# Patient Record
Sex: Female | Born: 1935 | Race: White | Hispanic: No | Marital: Married | Smoking: Former smoker
Health system: Southern US, Community
[De-identification: ages and names within clinical notes are randomized; demographics above are authoritative.]

## PROBLEM LIST (undated history)

## (undated) DIAGNOSIS — Z8673 Personal history of transient ischemic attack (TIA), and cerebral infarction without residual deficits: Secondary | ICD-10-CM

## (undated) DIAGNOSIS — R911 Solitary pulmonary nodule: Secondary | ICD-10-CM

## (undated) DIAGNOSIS — I2510 Atherosclerotic heart disease of native coronary artery without angina pectoris: Secondary | ICD-10-CM

## (undated) DIAGNOSIS — E785 Hyperlipidemia, unspecified: Secondary | ICD-10-CM

## (undated) DIAGNOSIS — J449 Chronic obstructive pulmonary disease, unspecified: Secondary | ICD-10-CM

## (undated) DIAGNOSIS — Z9981 Dependence on supplemental oxygen: Secondary | ICD-10-CM

## (undated) DIAGNOSIS — K683 Retroperitoneal hematoma: Secondary | ICD-10-CM

## (undated) DIAGNOSIS — D649 Anemia, unspecified: Secondary | ICD-10-CM

## (undated) DIAGNOSIS — I480 Paroxysmal atrial fibrillation: Secondary | ICD-10-CM

## (undated) DIAGNOSIS — I5032 Chronic diastolic (congestive) heart failure: Secondary | ICD-10-CM

## (undated) DIAGNOSIS — K661 Hemoperitoneum: Secondary | ICD-10-CM

## (undated) DIAGNOSIS — J9610 Chronic respiratory failure, unspecified whether with hypoxia or hypercapnia: Secondary | ICD-10-CM

## (undated) DIAGNOSIS — I714 Abdominal aortic aneurysm, without rupture, unspecified: Secondary | ICD-10-CM

## (undated) DIAGNOSIS — I10 Essential (primary) hypertension: Secondary | ICD-10-CM

## (undated) DIAGNOSIS — N183 Chronic kidney disease, stage 3 unspecified: Secondary | ICD-10-CM

## (undated) HISTORY — PX: HEMORRHOID SURGERY: SHX153

## (undated) HISTORY — DX: Abdominal aortic aneurysm, without rupture: I71.4

## (undated) HISTORY — DX: Retroperitoneal hematoma: K68.3

## (undated) HISTORY — PX: APPENDECTOMY: SHX54

## (undated) HISTORY — PX: ABDOMINAL HYSTERECTOMY: SHX81

## (undated) HISTORY — DX: Chronic respiratory failure, unspecified whether with hypoxia or hypercapnia: J96.10

## (undated) HISTORY — DX: Paroxysmal atrial fibrillation: I48.0

## (undated) HISTORY — DX: Chronic diastolic (congestive) heart failure: I50.32

## (undated) HISTORY — DX: Abdominal aortic aneurysm, without rupture, unspecified: I71.40

## (undated) HISTORY — DX: Dependence on supplemental oxygen: Z99.81

## (undated) HISTORY — PX: TONSILLECTOMY: SUR1361

## (undated) HISTORY — DX: Atherosclerotic heart disease of native coronary artery without angina pectoris: I25.10

## (undated) HISTORY — DX: Anemia, unspecified: D64.9

## (undated) HISTORY — PX: PARTIAL HIP ARTHROPLASTY: SHX733

## (undated) HISTORY — PX: CAROTID ENDARTERECTOMY: SUR193

## (undated) HISTORY — DX: Hemoperitoneum: K66.1

---

## 2003-12-30 ENCOUNTER — Ambulatory Visit (HOSPITAL_COMMUNITY): Admission: RE | Admit: 2003-12-30 | Discharge: 2003-12-30 | Payer: Self-pay | Admitting: Family Medicine

## 2004-01-28 ENCOUNTER — Encounter (HOSPITAL_COMMUNITY): Admission: RE | Admit: 2004-01-28 | Discharge: 2004-02-27 | Payer: Self-pay | Admitting: Neurosurgery

## 2004-10-11 ENCOUNTER — Ambulatory Visit (HOSPITAL_COMMUNITY): Admission: RE | Admit: 2004-10-11 | Discharge: 2004-10-11 | Payer: Self-pay | Admitting: Family Medicine

## 2004-12-08 ENCOUNTER — Ambulatory Visit (HOSPITAL_COMMUNITY): Admission: RE | Admit: 2004-12-08 | Discharge: 2004-12-08 | Payer: Self-pay | Admitting: Family Medicine

## 2006-09-01 ENCOUNTER — Ambulatory Visit (HOSPITAL_COMMUNITY): Admission: RE | Admit: 2006-09-01 | Discharge: 2006-09-01 | Payer: Self-pay | Admitting: Family Medicine

## 2007-05-05 ENCOUNTER — Inpatient Hospital Stay (HOSPITAL_COMMUNITY): Admission: EM | Admit: 2007-05-05 | Discharge: 2007-05-09 | Payer: Self-pay | Admitting: Emergency Medicine

## 2007-05-05 ENCOUNTER — Ambulatory Visit: Payer: Self-pay | Admitting: Internal Medicine

## 2007-06-01 ENCOUNTER — Ambulatory Visit: Payer: Self-pay | Admitting: Vascular Surgery

## 2007-06-07 ENCOUNTER — Encounter: Payer: Self-pay | Admitting: Vascular Surgery

## 2007-06-07 ENCOUNTER — Ambulatory Visit: Payer: Self-pay | Admitting: Vascular Surgery

## 2007-06-07 ENCOUNTER — Inpatient Hospital Stay (HOSPITAL_COMMUNITY): Admission: RE | Admit: 2007-06-07 | Discharge: 2007-06-08 | Payer: Self-pay | Admitting: Vascular Surgery

## 2007-06-20 ENCOUNTER — Ambulatory Visit: Payer: Self-pay | Admitting: Vascular Surgery

## 2007-08-10 ENCOUNTER — Ambulatory Visit (HOSPITAL_COMMUNITY): Admission: RE | Admit: 2007-08-10 | Discharge: 2007-08-10 | Payer: Self-pay | Admitting: Family Medicine

## 2008-01-16 ENCOUNTER — Ambulatory Visit: Payer: Self-pay | Admitting: Vascular Surgery

## 2008-02-18 ENCOUNTER — Ambulatory Visit (HOSPITAL_COMMUNITY): Admission: RE | Admit: 2008-02-18 | Discharge: 2008-02-18 | Payer: Self-pay | Admitting: Family Medicine

## 2009-10-21 ENCOUNTER — Ambulatory Visit (HOSPITAL_COMMUNITY): Admission: RE | Admit: 2009-10-21 | Discharge: 2009-10-21 | Payer: Self-pay | Admitting: Family Medicine

## 2010-02-04 ENCOUNTER — Encounter: Admission: RE | Admit: 2010-02-04 | Discharge: 2010-02-04 | Payer: Self-pay | Admitting: Orthopedic Surgery

## 2010-03-12 ENCOUNTER — Ambulatory Visit (HOSPITAL_COMMUNITY): Admission: RE | Admit: 2010-03-12 | Discharge: 2010-03-12 | Payer: Self-pay | Admitting: Family Medicine

## 2010-04-26 ENCOUNTER — Inpatient Hospital Stay (HOSPITAL_COMMUNITY): Admission: RE | Admit: 2010-04-26 | Discharge: 2010-04-28 | Payer: Self-pay | Admitting: Orthopedic Surgery

## 2010-04-26 ENCOUNTER — Encounter (INDEPENDENT_AMBULATORY_CARE_PROVIDER_SITE_OTHER): Payer: Self-pay | Admitting: Orthopedic Surgery

## 2010-06-22 ENCOUNTER — Ambulatory Visit (HOSPITAL_COMMUNITY): Admission: RE | Admit: 2010-06-22 | Discharge: 2010-06-22 | Payer: Self-pay | Admitting: Family Medicine

## 2010-06-30 ENCOUNTER — Encounter (HOSPITAL_COMMUNITY): Admission: RE | Admit: 2010-06-30 | Discharge: 2010-07-30 | Payer: Self-pay | Admitting: Orthopedic Surgery

## 2010-09-16 ENCOUNTER — Ambulatory Visit (HOSPITAL_COMMUNITY): Admission: RE | Admit: 2010-09-16 | Discharge: 2010-09-16 | Payer: Self-pay | Admitting: Family Medicine

## 2010-11-27 ENCOUNTER — Encounter: Payer: Self-pay | Admitting: Family Medicine

## 2011-01-23 LAB — COMPREHENSIVE METABOLIC PANEL
ALT: 21 U/L (ref 0–35)
AST: 21 U/L (ref 0–37)
Albumin: 3.8 g/dL (ref 3.5–5.2)
Alkaline Phosphatase: 73 U/L (ref 39–117)
BUN: 16 mg/dL (ref 6–23)
CO2: 28 mEq/L (ref 19–32)
Calcium: 9.5 mg/dL (ref 8.4–10.5)
Chloride: 106 mEq/L (ref 96–112)
Creatinine, Ser: 1.03 mg/dL (ref 0.4–1.2)
GFR calc Af Amer: >60 mL/min (ref >60)
GFR calc non Af Amer: 53 mL/min — ABNORMAL LOW (ref >60)
Glucose, Bld: 156 mg/dL — ABNORMAL HIGH (ref 70–99)
Potassium: 3.2 mEq/L — ABNORMAL LOW (ref 3.5–5.1)
Sodium: 143 mEq/L (ref 135–145)
Total Bilirubin: 0.4 mg/dL (ref 0.3–1.2)
Total Protein: 6.6 g/dL (ref 6.0–8.3)

## 2011-01-23 LAB — CBC
HCT: 28.4 % — ABNORMAL LOW (ref 36.0–46.0)
HCT: 41.4 % (ref 36.0–46.0)
Hemoglobin: 14.1 g/dL (ref 12.0–15.0)
Hemoglobin: 9.4 g/dL — ABNORMAL LOW (ref 12.0–15.0)
MCHC: 34 g/dL (ref 30.0–36.0)
MCV: 91 fL (ref 78.0–100.0)
Platelets: 161 10*3/uL (ref 150–400)
Platelets: 183 10*3/uL (ref 150–400)
Platelets: 248 10*3/uL (ref 150–400)
RBC: 4.55 MIL/uL (ref 3.87–5.11)
RDW: 14.6 % (ref 11.5–15.5)
RDW: 14.9 % (ref 11.5–15.5)
WBC: 8.9 10*3/uL (ref 4.0–10.5)
WBC: 9.4 10*3/uL (ref 4.0–10.5)

## 2011-01-23 LAB — URINE MICROSCOPIC-ADD ON

## 2011-01-23 LAB — BASIC METABOLIC PANEL
BUN: 14 mg/dL (ref 6–23)
Calcium: 8.1 mg/dL — ABNORMAL LOW (ref 8.4–10.5)
Creatinine, Ser: 1 mg/dL (ref 0.4–1.2)
GFR calc non Af Amer: 49 mL/min — ABNORMAL LOW (ref >60)
GFR calc non Af Amer: 54 mL/min — ABNORMAL LOW (ref >60)
Glucose, Bld: 134 mg/dL — ABNORMAL HIGH (ref 70–99)
Potassium: 3.6 mEq/L (ref 3.5–5.1)
Sodium: 142 mEq/L (ref 135–145)

## 2011-01-23 LAB — TYPE AND SCREEN
ABO/RH(D): B POS
Antibody Screen: NEGATIVE

## 2011-01-23 LAB — URINE CULTURE
Colony Count: NO GROWTH
Culture: NO GROWTH

## 2011-01-23 LAB — DIFFERENTIAL
Basophils Absolute: 0 10*3/uL (ref 0.0–0.1)
Basophils Relative: 0 % (ref 0–1)
Eosinophils Absolute: 0.2 10*3/uL (ref 0.0–0.7)
Eosinophils Relative: 2 % (ref 0–5)
Lymphocytes Relative: 16 % (ref 12–46)
Lymphs Abs: 1.4 10*3/uL (ref 0.7–4.0)
Monocytes Absolute: 0.7 10*3/uL (ref 0.1–1.0)
Monocytes Relative: 8 % (ref 3–12)
Neutro Abs: 6.6 10*3/uL (ref 1.7–7.7)
Neutrophils Relative %: 74 % (ref 43–77)

## 2011-01-23 LAB — URINALYSIS, ROUTINE W REFLEX MICROSCOPIC
Ketones, ur: NEGATIVE mg/dL
Leukocytes, UA: NEGATIVE
Nitrite: NEGATIVE
Protein, ur: >300 mg/dL — AB
pH: 5.5 (ref 5.0–8.0)

## 2011-01-23 LAB — PROTIME-INR
INR: 0.97 (ref 0.00–1.49)
Prothrombin Time: 12.8 seconds (ref 11.6–15.2)

## 2011-01-23 LAB — APTT: aPTT: 23 seconds — ABNORMAL LOW (ref 24–37)

## 2011-03-22 NOTE — Op Note (Signed)
Sydney Jacobs, Sydney Jacobs               ACCOUNT NO.:  [id number]   MEDICAL RECORD NO.:  [id number]          PATIENT TYPE:  INP   LOCATION:  3302                         FACILITY:  [facility]   PHYSICIAN:  Janetta Hora. Fields, MD  DATE OF BIRTH:  [birth year]   DATE OF PROCEDURE:  06/07/2007  DATE OF DISCHARGE:                               OPERATIVE REPORT   PROCEDURE:  Left carotid endarterectomy.   PREOPERATIVE DIAGNOSIS:  Symptomatic left internal carotid artery  stenosis, greater than 70%.   POSTOPERATIVE DIAGNOSIS:  Symptomatic left internal carotid artery  stenosis, greater than 70%.   ANESTHESIA:  General.   SURGEON:  Charles E. Fields, M.D.   ASSISTANT:  Allen Kell, RNFA   OPERATIVE INDICATIONS:  The patient is a 77 year old female who,  approximately one month ago, had a left brain stroke.  She was noted to  have a 70% left internal carotid artery stenosis.   OPERATIVE FINDINGS:  1. 70% left internal carotid artery stenosis.  2. Exophytic plaque.  3. Dacron patch.  4. 10 French shunt.   OPERATIVE DETAILS:  After obtaining informed consent, the patient was  taken to the operating room.  The patient was placed in the supine  position on the operating table.  After induction of general anesthesia  and endotracheal intubation, the patient's entire left neck and chest  were prepped and draped in the usual sterile fashion.  An oblique  incision was made on the left neck just anterior to the left  sternocleidomastoid muscle.  The incision was carried down through the  subcutaneous tissues and platysma.  The sternocleidomastoid muscle was  reflected laterally.  Dissection was then carried down along a plane  anterior to the left internal jugular vein.  The common carotid artery  was dissected free circumferentially.  The vagus nerve was identified  and protected from harm's way.  External carotid and superior thyroid  arteries were dissected free circumferentially and vessel  loops placed  around these.  Dissection was then carried to the level of the carotid  bifurcation.  The hypoglossal nerve was identified and protected from  harm's way.  The internal carotid artery was dissected free  circumferentially just above the area where the plaque was palpated.  The patient was then given 5000 units of intravenous heparin.   The distal internal carotid artery was controlled with a vessel loop.  The external and superior thyroid arteries were controlled with vessel  loops followed by clamping of the common carotid artery with a DeBakey  clamp.  A longitudinal arteriotomy was then made in the common carotid  artery and this was extended up through the carotid bifurcation past the  level of disease.  The stenosis was approximately 70%.  There was a  large exophytic plaque near the primary area of stenosis in the proximal  internal carotid artery.  The arteriotomy was extended proximally down  past the level of some mild disease in the common carotid artery.   Next, a 10-French shunt was brought up on the operative field.  This was  then threaded into the distal internal carotid  artery and allowed to  back bleed thoroughly.  It was then placed down into the common carotid  artery after unclamping, inspected for air, and then opened with  restoration of flow to the brain after approximately five minutes.  The  proximal common carotid artery was then controlled with a Rumel  tourniquet.  Next, endarterectomy was begun in a suitable plane near the  carotid bifurcation.  All plaque was completely removed.  Good distal  and proximal end points were obtained.  All loose debris was then  removed from the carotid and the external carotid artery was  endarterectomized by eversion technique.  Next, the carotid was  thoroughly irrigated with heparinized saline.  A Dacron patch was  brought up on the operative field.  This was sewn on as a patch  angioplasty using running 6-0  Prolene suture.  Just prior to completion  of the anastomosis, the shunt was reclamped, the distal internal carotid  controlled with a fine bulldog clamp, and the common carotid artery  controlled with a peripheral DeBakey after the shunt was removed.  Everything was thoroughly flushed and back bled.  The artery was then  thoroughly flushed again with heparinized saline.  The anastomosis was  then completed.  Flow was then first restored from the common carotid up  into the external carotid artery and after approximately five cardiac  cycles, to the internal carotid artery.  One repair suture was placed on  the lateral aspect.  The Doppler was used to inspect the carotid and  there was good flow through the internal and external carotid and common  carotid arteries.   Hemostasis was obtained.  The platysma muscle was then reapproximated  using a running 3-0 Vicryl suture.  The skin was closed with 4-0 Vicryl  subcuticular stitch.  The patient tolerated the procedure well and there  were no complications.  Instrument, sponge and needle counts were  correct at the end of the case.  The patient was awakened in the  operating room, found to be moving upper extremities and lower  extremities symmetrically with 5/5 motor strength.  The patient was  taken to recovery room in stable condition.      Janetta Hora. Fields, MD  Electronically Signed     CEF/MEDQ  D:  06/07/2007  T:  06/07/2007  Job:  [id number]

## 2011-03-22 NOTE — Procedures (Signed)
CAROTID DUPLEX EXAM   INDICATION:  Followup known carotid artery disease.   HISTORY:  Diabetes:  No.  Cardiac:  Coronary artery disease.  Hypertension:  Yes.  Smoking:  No.  Previous Surgery:  No.  CV History:  No.  Amaurosis Fugax no, paresthesias no, hemiparesis no.                                       RIGHT             LEFT  Brachial systolic pressure:         138.              130.  Brachial Doppler waveforms:         Biphasic.         Biphasic.  Vertebral direction of flow:        Antegrade.        Antegrade.  DUPLEX VELOCITIES (cm/sec)  CCA peak systolic                   57.               89.  ECA peak systolic                   101.              215.  ICA peak systolic                   186.              235.  ICA end diastolic                   140.              64.  PLAQUE MORPHOLOGY:                  Calcified.        Calcified.  PLAQUE AMOUNT:                      Moderate.         Moderate to  severe.  PLAQUE LOCATION:                    ICA.              ICA.   IMPRESSION:  There is 60% to 79% stenosis noted in the left internal  carotid artery. There is 40% - 59% stenosis noted in the right internal  carotid artery. Antegrade bilateral vertebral arteries.   ___________________________________________  Janetta Hora Fields, MD   MG/MEDQ  D:  06/01/2007  T:  06/02/2007  Job:  [id number]

## 2011-03-22 NOTE — H&P (Signed)
Sydney Jacobs, Sydney Jacobs               ACCOUNT NO.:  [id number]   MEDICAL RECORD NO.:  [id number]          PATIENT TYPE:  EMS   LOCATION:  ED                            FACILITY:  [facility]   PHYSICIAN:  Skeet Latch, DO    DATE OF BIRTH:  [birth year]   DATE OF ADMISSION:  05/05/2007  DATE OF DISCHARGE:  LH                              HISTORY & PHYSICAL   CHIEF COMPLAINT:  Difficulty speaking and swollen tongue.   PRIMARY CARE PHYSICIAN:  Dr. Phillips Odor.   HISTORY OF PRESENT ILLNESS:  This is a 77 year old Caucasian female who  presents with the complaint of the sensation that her tongue is swollen.  Patient states that this afternoon after just sitting at her home, she  began to experience the sensation that her tongue was swollen.  The  patient then talked to her daughter by phone and her daughter states  that she thought that she was slurring her speech.  Since her daughter  lives next door she came over to the house.  They discussed her symptoms  and patient basically states that she had the sensation that her tongue  was swollen and she had problems swallowing.  The patient was then  brought to the emergency room for evaluation.  The patient states that  she does not have any facial droop or weakness on either side of  her  body but just states that she has the sensation that her tongue was  swollen.  Patient denies any difficulty swallowing, walking, dizziness,  nausea, neck pain, or any other type of pain.  The patient does admit to  a slight headache that occurred since she has been in the ER. Upon being  seen in the ER, patient had a CAT scan of her head performed which  showed chronic microvascular white matter disease but no acute findings.  The patient's only medical history is hypertension for which she takes  Diovan and Norvasc.  The patient does admit that she took one Advil  during this episode at home, otherwise took no other medications.   PAST MEDICAL HISTORY:   Hypertension.   FAMILY HISTORY:  Unremarkable.   SOCIAL HISTORY:  Denies any smoking, alcohol or illicit drug use.   ALLERGIES:  QUESTIONABLE PENICILLIN ALLERGY.   MEDICATIONS:  1. Diovan HCT 80/12.5 mg daily.  2. Norvasc 10 mg daily.  The patient does state that she took a new allergy medicine prescribed  by her primary care physician, Xyzal this a.m.   REVIEW OF SYSTEMS:  HEENT:  Patient admits to a slight headache.  Denies  any blurry vision.  Does admit to some sensation of a swollen tongue.  Denies any throat pain, nose problems.  CARDIOVASCULAR:  Denies any  chest pain.  RESPIRATORY:  Denies any shortness of breath or dyspnea.  GI: Denies any nausea, vomiting, diarrhea, abdominal pain.  GENITOURINARY:  Denies any dysuria, urgency.  MUSCULOSKELETAL:  Denies  any arthralgias.  SKIN:  Denies any rashes, skin lesions.  NEUROLOGIC:  As stated, a slight headache but no light headedness or  dizziness.  All other systems  are negative.   PHYSICAL EXAMINATION:  VITAL SIGNS:  Temperature is 98.3, respirations  18, pulse 82, blood pressure is 139/69.  HEENT:  Head is atraumatic, normocephalic. PERLA. EOMI. Conjunctivae and  lids are normal, no scleral icterus noted. Nose and throat normal.  Tongue  - did not appreciate any swelling.  Good dentition.  Patient had  a normal voice.  NECK:  Supple, nontender, nondistended, no thyromegaly, no bruits, no  lymphadenopathy noted.  CARDIOVASCULAR:  Regular rate and rhythm, no murmurs or gallops were  noted.  RESPIRATORY:  Lungs were clear to auscultation, no rhonchi or wheezing,  no rubs.  ABDOMEN:  Soft, nontender, nondistended, bowel sounds were present.  EXTREMITIES:  No clubbing, cyanosis or edema.  NEUROLOGIC:  The patient is alert and oriented x3. Cranial nerves II-XII  grossly intact. Had normal sensation. Gait was normal. Normal speech.  The patient had good strength bilaterally in upper and lower  extremities.  SKIN:  Normal  turgor, good color, warm and dry.   LABORATORY DATA:  CT of her head as stated, showed chronic microvascular  changes. No acute disease.  Sodium 138, potassium 3.4, chloride 101, CO2  is 28, glucose 96, BUN 26, creatinine 1.41.  INR is 1, PT 13.6.  White  count is 8,000, hemoglobin 12.5, hematocrit 36.2, platelets 258,000.   ASSESSMENT:  This is a 77 year old who presents with a complaint of  sensation that her tongue is swollen.  The daughter states the patient  appeared to have slurred speech but denied any facial droop or muscle  weakness.  CT of her head did not show any acute findings.   PLAN:  1. The patient will be admitted with the diagnosis of dysarthria,      hypertension.  For her dysarthria, a possible RTAA. Patient had a      negative CT of her head, will get an MRI in the a.m. to rule out      any acute process.  Give patient aspirin 325 mg and place patient      with SEDs and TED hose for DVT prophylaxis.  2. Hypertension:  Continue home medications which include Diovan and      Norvasc. The patient probably will need and echo and carotid      Doppler's and will see how patient is doing in the a.m.  If patient      remains stable, may get these tests as an outpatient.  It is      possible patient may have had an allergic reaction to food or      medication. Will place patient on Benadryl and follow patient      carefully.      Skeet Latch, DO  Electronically Signed     SM/MEDQ  D:  05/05/2007  T:  05/05/2007  Job:  [id number]   cc:   Corrie Mckusick, M.D.  Fax: [phone]

## 2011-03-22 NOTE — Consult Note (Signed)
Sydney Jacobs, Sydney Jacobs               ACCOUNT NO.:  [id number]   MEDICAL RECORD NO.:  [id number]          PATIENT TYPE:  INP   LOCATION:  A202                          FACILITY:  [facility]   PHYSICIAN:  Kofi A. Gerilyn Pilgrim, M.D. DATE OF BIRTH:  [birth year]   DATE OF CONSULTATION:  05/08/2007  DATE OF DISCHARGE:                                 CONSULTATION   REASON FOR CONSULTATION:  This is a 77 year old lady who presents with  the acute onset of sensation, as if her tongue is swollen. The patient's  daughter also indicated that the patient had slurring of the speech. The  events apparently lasted for a couple of hours until she came to the  emergency room. She has denied any focal numbness, weakness, dizziness,  or swallowing problems. It appears that she has returned back to  baseline. No facial weakness is reported.   PAST MEDICAL HISTORY:  Hypertension.   FAMILY HISTORY:  Unrevealing.   SOCIAL HISTORY:  No alcohol, tobacco, or illicit drug use.   ALLERGIES:  PENICILLIN.   ADMISSION MEDICATIONS:  Diovan 80/12.5 daily, Norvasc 10 mg daily. She  was not taking anti-platelet agents.   REVIEW OF SYSTEMS:  Essentially unrevealing. No headaches are reported.  No cardiovascular symptoms such as chest pain or shortness of breath.   PHYSICAL EXAMINATION:  GENERAL:  A pleasant lady in no acute distress.  VITAL SIGNS:  Temperature 97.9, pulse 87, respiratory rate 17, blood  pressure 147/79.  HEENT:  Normocephalic and atraumatic.  NECK:  Supple.  EXTREMITIES:  No clubbing, cyanosis, or edema.  NEUROLOGIC:  The patient is awake and alert. She converses well. Tonight  I see no evidence of dysphagia, dysarthria, or aphasia. Speech,  language, and cognition are essentially normal. Cranial nerve  evaluation, pupils are 4 mm and reactive. Visual fields are intact.  Extraocular movements are full. Facial muscles are symmetric. Tongue is  midline. Uvula midline. Shoulder shrug is normal. Motor  examination  shows normal tone, bulk, and strength. There is no pronator drift.  Coordination shows no tremors, dysmetria, past pointing or rigidity.  Reflexes are preserved. Sensation normal to temperature and light touch.   DIAGNOSTIC STUDIES:  Initial CT scan of the brain shows nothing acute,  essentially unrevealing. MRI of the brain shows acute infarcts, 2 or 3  small to moderate lesions on diffusion imaging involving the left  parietal area, involving the MCA distribution. There is a suggestion of  possibly a tiny hyper-intensity noted, seen on one cut involving the  cortical ribbon of the occipital area on the left side but this is  questionable. There are significant incomplete white matter-looking  encephalopathy, indicating chronic ischemic changes.   ASSESSMENT:  1. Acute left parietal infarct.  2. Possible tiny acute left occipital area of infarct.  3. Hypertension.   RECOMMENDATIONS:  1. I agree with aspirin, anti-platelet therapy.  2. Echo of the carotids.  3. I will go ahead and add MRA.   Thanks for this consultation.      Kofi A. Gerilyn Pilgrim, M.D.  Electronically Signed     KAD/MEDQ  D:  05/08/2007  T:  05/08/2007  Job:  [id number]

## 2011-03-22 NOTE — Procedures (Signed)
[name], Sydney Jacobs               ACCOUNT NO.:  [id number]   MEDICAL RECORD NO.:  [id number]          PATIENT TYPE:  INP   LOCATION:  A202                          FACILITY:  [facility]   PHYSICIAN:  Pricilla Riffle, MD, FACCDATE OF BIRTH:  09/15/1934   DATE OF PROCEDURE:  05/07/2007  DATE OF DISCHARGE:                                ECHOCARDIOGRAM   REFERRING PHYSICIAN:  Dr. Rito Ehrlich.   INDICATIONS:  A 77 year old woman with history of CVA and hypertension.   2-D ECHOCARDIOGRAM WITH ECHOCARDIOGRAPHIC DOPPLER:  Left ventricle is a  small in size with an end-diastolic dimension of approximately 34 mm.  The interventricular septum and posterior wall were normal at 11 mm  each.  The left atrium is normal at 28 mm.  Right atrium, right  ventricle are normal.  Aortic root is normal at 26 mm.   The aortic valve is mildly thickened, not stenotic.  There is no  insufficiency.  Mitral valve is mildly thickened with mild calcification  of the chordae. There is no stenosis. There is mild annular  calcification.  There is mild insufficiency (1 out of 4). The pulmonic  valve is not well seen.  Tricuspid valve is normal with no  insufficiency.   Overall LV systolic function is normal with an LVEF of approximately 55-  60%.  There is mild diastolic dysfunction.   RV systolic function is normal.   No pericardial effusion is seen.   NOTE:  There is an echo-free space in the liver consistent with possible  cyst. Recommend consider evaluation with other modality (right upper  quadrant ultrasound).      Pricilla Riffle, MD, [facility]  Electronically Signed     PVR/MEDQ  D:  05/07/2007  T:  05/07/2007  Job:  [id number]   cc:   Dr. Rito Ehrlich

## 2011-03-22 NOTE — Consult Note (Signed)
NAME:  Sydney Jacobs, Sydney Jacobs NO.:  [id number]   MEDICAL RECORD NO.:  [id number]          PATIENT TYPE:  INP   LOCATION:  A202                          FACILITY:  [facility]   PHYSICIAN:  Pricilla Riffle, MD, FACCDATE OF BIRTH:  1934/05/03   DATE OF CONSULTATION:  05/07/2007  DATE OF DISCHARGE:                                 CONSULTATION   IDENTIFICATION:  The patient is a 77 year old who we are asked to see  regarding an abnormal EKG.   HISTORY OF PRESENT ILLNESS:  The patient was admitted on June 28 with  sensation of a swollen tongue, swallowing problems, went to the  emergency room that day.  ER scheduled a head CT that showed no acute  findings.  The patient currently felt to have suffered a stroke/TIA and  her symptoms have resolved.  The patient notes occasional shortness of  breath.  No chest pain.  No syncope.  Had stress test many years ago.  No known cardiac problems.   ALLERGIES:  QUESTION PENICILLIN.   MEDICATIONS:  1. Diovan/HCTZ 80/12.5.  2. Norvasc 10.  3. Advil p.r.n.  4. Allergy medicine, question type.   HOSPITAL MEDICATIONS:  1. Aspirin 325.  2. Prevacid 30.  3. Norvasc.  4. Diovan HCTZ.  5. Clonidine p.r.n.  6. Lovenox 40 daily.  7. Zocor 20 daily.   PAST MEDICAL HISTORY:  Hypertension.   SOCIAL HISTORY:  Does not smoke, does not drink.  Married.   FAMILY HISTORY:  Father died of an MI.  Mother died of an aneurysm in  her head.   REVIEW OF SYSTEMS:  All systems reviewed.  Negative to the above problem  except as noted above.   PHYSICAL EXAMINATION:  GENERAL:  The patient is in no acute distress.  Denies chest pain or shortness of breath.  VITAL SIGNS:  Blood pressure is 147/79 range today from 118 systolic to  161, pulse is in the 80s to 100, temperature is 97.7.  HEENT:  Normocephalic, atraumatic.  EOMI.  PERRL.  Throat clear.  Nares  clear.  NECK:  JVP is normal, left carotid bruit.  No thyromegaly.  LUNGS:  Clear to auscultation.  CARDIAC:  Exam regular rate rhythm S1-S2.  No S3-S4 or significant  murmurs.  ABDOMEN:  Supple, nontender.  No hepatomegaly.  No masses.  Normal bowel  sounds.  EXTREMITIES:  Good distal pulses throughout.  No lower extremity edema.   STUDIES:  A 12-lead EKG shows normal sinus rhythm, 84 beats per minute.  Left bundle branch block.   Echocardiogram pending.   LABORATORY DATA:  CK 87, MB of 3.5, troponin 0.03, total cholesterol  225, triglycerides 176, HDL 45, LDL 145.  TSH 1.48, K of 3.5, B-met  earlier 3.4 K.  BUN and creatinine of 18 and 1.1.  CBC on June 28,  hemoglobin and 12.5, WBC 8.0, platelets of 258.   IMPRESSION:  The patient is a 77 year old admitted with speech changes,  felt probably to have had a CVA, which is undergoing workup.  MRI is  pending.  EKG is abnormal with left bundle  branch block.  She has had  EKGs in the past, had a stress test in the past here at [facility].  I  will try to retrieve this.  She also had an echocardiogram today.   She denied chest pain, no syncope.  Exam is relatively unremarkable.  EKG again as noted.   IMPRESSION:  1. Left bundle branch block.  Will review old EKGs and stress test.      If this is a new finding, at some point once the above problem has      resolved and she has recovered, would schedule an outpatient stress      test to further define, but again only when neurologic issues have      stabilized, and the patient would be a candidate for possible      intervention if needed.  2. Dyslipidemia.  Agree with statin.  3. Carotid bruit, again patient having MRI done.  Hopefully, this will      evaluate the carotid arteries as well.  4. We will follow up with the patient and previous results.      Pricilla Riffle, MD, [facility]  Electronically Signed     PVR/MEDQ  D:  05/07/2007  T:  05/08/2007  Job:  [id number]

## 2011-03-22 NOTE — Assessment & Plan Note (Signed)
OFFICE VISIT   Sydney, Jacobs  DOB:  1934/03/08                                       06/20/2007  ZOXWR#:[id number]   Sydney Jacobs returns for follow up today after having a left carotid  endarterectomy on June 07, 2007.  Her postoperative recovery was  uneventful.  She presents today with no new neurologic symptoms.  Her  blood pressure was slightly elevated today at 169/92 in the left arm and  168/88 in the right arm.  I advised her she should check her blood  pressure daily and keep a log of this to give to her primary care doctor  at her neck visit.  Her left neck incision is well healed.  She has a  very slight faint left carotid bruit.  Right neck has no bruit. Upper  extremity and lower extremity motor strength is 5/5.  Tongue is midline.  She has no swallowing difficulties.  Overall, Sydney Jacobs is doing well.  She will return in 6 months time for a repeat carotid duplex to make  sure that she has had no renarrowing or progression of disease on the  contralateral side.  She will continue to take one aspirin daily.  She  will follow up sooner if she has new neurologic symptoms.   Janetta Hora. Fields, MD  Electronically Signed   CEF/MEDQ  D:  06/20/2007  T:  06/22/2007  Job:  [id number]   cc:   Corrie Mckusick, M.D.

## 2011-03-22 NOTE — Assessment & Plan Note (Signed)
OFFICE VISIT   Sydney Jacobs, Sydney Jacobs  DOB:  Mar 28, 1934                                       06/01/2007  GMWNU#:[id number]   Note for the chart:  The patient is a 77 year old female who recently  had a stroke in late June early July 2008.  The stroke manifested itself  as some dysarthria and trouble swallowing.  This resolved fairly quickly  and she has returned to her baseline.  During the course of her workup  during her stroke, she was noted to have a left parietal and occipital  infarct by MRI at [facility].  She also had a carotid duplex  scan which showed bilateral 70% internal carotid artery stenosis.  Prior  to the stroke event, she had had no other TIAs or strokes.   ATHEROSCLEROTIC RISK FACTORS:  Include primarily hypertension.  She  denies a history of coronary artery disease or diabetes.  She is a  former smoker, but quit in 1997.   PAST SURGICAL HISTORY:  She had a tonsillectomy, appendectomy,  hemorrhoidectomy, hysterectomy.   PAST MEDICAL HISTORY:  History of claudication.  Recent ABIs at [facility] were 0.73 on the right, and 0.68 on the left.  She  currently has 2-block claudication right greater than left.  She denies  rest pain and has had no non-healing wounds.   MEDICATIONS:  1. Norvasc 10 mg once a day.  2. Diovan HCT 80/12.5 once a day.  3. Zocor 20 mg once a day.  4. Aspirin 81 mg once a day.  5. Fish oil omega-3 1,000 units two tablets daily.   ALLERGIES:  1. PENICILLIN.  She has a side effect from penicillin which causes      dizziness.  2. SULFA, she has nausea.   FAMILY HISTORY:  Noncontributory.   SOCIAL HISTORY:  She is married.  Smoking history is as above.  She does  not consume alcohol regularly.   REVIEW OF SYSTEMS:  She has some dyspnea with exertion.  She denies a  history of asthma, wheezing, GI bleeding, renal dysfunction, seizures,  syncope, dizziness, or changes in eyesight.  She does have  some  arthritis and history of gout.   PHYSICAL EXAMINATION:  Vital signs:  Blood pressure is 147/79 in the  left arm, 156/81 in the right arm, heart rate is 83 and regular.  HEENT:  Unremarkable. She has 2+ carotid pulses with a faint left carotid bruit.  She has 2+ brachial and radial and femoral pulses.  She has absent  popliteal and pedal pulses bilaterally.  Feet are pink, warm, and well  perfused.  Abdomen:  Soft, nontender.  Cardiac:  Regular rate and rhythm  without murmur.  Chest:  Clear to auscultation.   She had a repeat carotid duplex exam today to make sure that she had no  had occlusion of her internal carotid artery since her last duplex exam.  This showed a 60-80% stenosis of the left internal carotid artery with a  peak systolic velocity of 235-cm/sec and a fairly calcified plaque.  She  had a 40-60% right internal carotid artery stenosis.  Vertebral artery  flow was antegrade bilaterally.   The patient has a symptomatic left internal carotid artery stenosis  which is greater than 70%.  I believe she would benefit from left  carotid  endarterectomy for further stroke prophylaxis.  I described to  her today the risk, benefit, possible complications, and procedure  details including, but not limited to, bleeding, infection, stroke,  cranial nerve injury.  She understands and agrees to proceed.  We will  schedule her carotid endarterectomy for next week.  I informed her to  continue her aspirin and her antihypertensives.   Janetta Hora. Fields, MD  Electronically Signed   CEF/MEDQ  D:  06/02/2007  T:  06/04/2007  Job:  [id number]   cc:   Corrie Mckusick, M.D.

## 2011-03-22 NOTE — Group Therapy Note (Signed)
NAMEMAKESHIA, SEAT               ACCOUNT NO.:  [id number]   MEDICAL RECORD NO.:  [id number]          PATIENT TYPE:  INP   LOCATION:  A202                          FACILITY:  [facility]   PHYSICIAN:  Dorris Singh, DO    DATE OF BIRTH:  Sep 30, 1934   DATE OF PROCEDURE:  05/08/2007  DATE OF DISCHARGE:                                 PROGRESS NOTE   The patient is a 77 year old Caucasian female who presented to [facility] with complaints of possible stroke and TIA. The patient has been  here since June 28 and has had a series of testing which includes  carotid duplex on the 30th, which demonstrated severe bilateral carotid  bifurcation, atherosclerosis with elevated abnormal ICA velocities and  ICA: CCA ratios, bilateral ICA stenosis in the 70% range, slightly worse  on the left. Also, had noted for further evaluation conventional  catheter angiogram versus CT angiogram to assess the ICA stenosis. She  had a ultrasound arterial segment multiple Doppler done and that showed  bilateral moderately severe lower extremity arterial occlusive disease  for that and then her MRA of the head, which was done July 1, showed  potential high-grade stenosis in the middle right M2 branch. No  significant left-sided disease with mild atherosclerotic irregularity  within the anterior genu of the right cavernous artery.   Her vitals for today are temperature 99.0, pulse 87, respirations 20,  blood pressure 146/79.  Generally, this is a well-developed, well-nourished Caucasian female who  is in no acute distress.  HEENT: The patient wears glasses. No nasal discharge. Conjunctivae is  within normal limits. Mouth there is no exudate or erythema noted.  NECK: Full range of motion.  HEART: Regular rate and rhythm.  LUNGS:  Respirations are symmetrical. Clear to auscultation bilaterally.  ABDOMEN: Soft and nontender. Nondistended. Bowel sounds x4.  EXTREMITIES: Positive pulses in all four extremities. Good  strength in  all four extremities.  NEUROLOGIC: The patient is alert and oriented x3.   ASSESSMENT/PLAN:  1. Transient ischemic attack/cerebral vascular accident: The MRA shows      significant stenosis in the majority of critical arteries. Will      keep the patient on Coumadin. Requests Dr.  Gerilyn Pilgrim      recommendations regarding Coumadin therapy.  2. Left bundle branch block. This is monitored by cardiology.  3. Hypertension, is stable.  4. Dyslipidemia, will continue the patient on medication.  5. Claudication of lower extremities. Have the arterial Doppler      results and has stated that the patient does have disease.   PLAN:  Plan on discharging the patient in one day and have the patient  followup with Vascular Surgery and her primary care physician who is Dr.  Phillips Odor and Neurology.      Dorris Singh, DO  Electronically Signed     CB/MEDQ  D:  05/08/2007  T:  05/08/2007  Job:  [id number]

## 2011-03-22 NOTE — Discharge Summary (Signed)
Sydney Jacobs, Sydney Jacobs               ACCOUNT NO.:  [id number]   MEDICAL RECORD NO.:  [id number]          PATIENT TYPE:  INP   LOCATION:  A202                          FACILITY:  [facility]   PHYSICIAN:  Skeet Latch, DO    DATE OF BIRTH:  June 25, 1934   DATE OF ADMISSION:  05/05/2007  DATE OF DISCHARGE:  07/02/2008LH                               DISCHARGE SUMMARY   DISCHARGE DIAGNOSES:  1. Acute left parietal infarct.  2. Hypertension.  3. Coronary artery disease.  4. Moderate-to-severe lower extremity arterial disease.   BRIEF HOSPITAL COURSE:  This is a 77 year old Caucasian female who  presented with a complaint that she had the sensation that her tongue  was swollen.  Patient states that this afternoon while sitting at home,  she began to experience that sensation and called her daughter.  Her  daughter thought that she had some slurred speech.  Patient was brought  to the emergency room for evaluation.  There was no obvious facial droop  or weakness on any side of her face.  During the initial exam, the  patient had no difficulty swallowing, walking, dizziness, nausea, or any  type of pain at that time.  The patient did have a CT of her head done  without contrast that showed chronic microvascular changes.   Patient did have an MRI of her brain done that showed acute infarct in  the left frontoparietal cortex.  A small area of acute infarct in the  left occipital lobe.  It showed atrophy of the chronic small vessel  ischemic change in the white matter.   Patient also had carotid Dopplers performed which showed bilateral  mildly severe carotid bifurcation with atherosclerosis with elevated  abnormal ICA velocities and ICA/CCA ratios.  Bilateral ICA stenosis  approaching the 70% range, slightly worse on the left.   The patient arterial studies performed on her legs secondary to pain  that showed bilaterally mildly severe lower extremity arterial occlusive  disease at rest.   The  patient had MRA of her head done which showed potential high grade  stenosis in the middle right M2 branch.  No significant left-sided  disease.  Mild atherosclerotic irregularities within the anterior genu  of the right cavernous carotid artery.   Patient has been feeling well since admission.  Patient has been seen by  neurology, who states that this time he felt the patient could be sent  home on aspirin.  The patient will need vascular surgeon referral as an  outpatient regarding her carotid stenosis and her arterial disease in  her lower extremities.   Patient has also been followed by cardiology, which had a question of  why the patient should be started on Coumadin.  At this time, we feel  the patient is stable enough for discharge and followup as an  outpatient.   DISCHARGE PHYSICAL EXAMINATION:  VITALS ON DISCHARGE:  Temperature is  98.5, pulse 88, respirations 18, blood pressure 130/86.  Patient is  satting at 93%.  HEART:  Regular rate and rhythm.  LUNGS:  Clear to auscultation bilaterally.  ABDOMEN:  Soft, nontender, nondistended.  Positive bowel sounds.  EXTREMITIES:  No pain.  No clubbing, cyanosis or edema was appreciated.   DISCHARGE MEDICATIONS:  1. Norvasc 10 mg daily.  2. Diovan/HCT 80/12.5 mg daily.  3. Enteric-coated aspirin 81 mg once daily.  4. Zocor 20 mg daily.   Will defer to the vascular surgeon and her primary care doctor regarding  the patient may need to be started on Coumadin in the near future.   Patient's condition at discharge is stable.   DIET:  Maintain a low sodium, heart-healthy diet.   ACTIVITY:  Increase activity slowly.   INSTRUCTIONS:  Patient is to follow up with Dr. Phillips Odor in approximately  one week.  Patient wants to wait to see Dr. Phillips Odor and get referred to  a vascular surgeon.      Skeet Latch, DO  Electronically Signed     SM/MEDQ  D:  05/09/2007  T:  05/10/2007  Job:  [id number]   cc:   Dr. Phillips Odor

## 2011-03-22 NOTE — H&P (Signed)
Sydney Jacobs, Sydney Jacobs               ACCOUNT NO.:  [id number]   MEDICAL RECORD NO.:  [id number]          PATIENT TYPE:  INP   LOCATION:  A202                          FACILITY:  [facility]   PHYSICIAN:  Skeet Latch, DO    DATE OF BIRTH:  November 17, 1933   DATE OF ADMISSION:  05/05/2007  DATE OF DISCHARGE:  LH                              HISTORY & PHYSICAL   ADDENDUM:  Please change in my plan, that the patient will be admitted  NOT for dysarthria but for questionable TIA at this time.      Skeet Latch, DO  Electronically Signed     SM/MEDQ  D:  05/05/2007  T:  05/05/2007  Job:  [id number]

## 2011-03-22 NOTE — Procedures (Signed)
CAROTID DUPLEX EXAM   INDICATION:  Followup of known carotid artery disease.  The patient is  asymptomatic.   HISTORY:  Diabetes:  No.  Cardiac:  CAD.  Hypertension:  Yes.  Smoking:  No.  Previous Surgery:  Left CEA with DPA on 06/07/2007 by Dr. Darrick Penna.  CV History:  CVA approximately 7 months ago.  Amaurosis Fugax No, Paresthesias No, Hemiparesis No                                       RIGHT             LEFT  Brachial systolic pressure:         136               140  Brachial Doppler waveforms:         WNL               WNL  Vertebral direction of flow:        Antegrade         Antegrade  DUPLEX VELOCITIES (cm/sec)  CCA peak systolic                   66                82  ECA peak systolic                   82                129  ICA peak systolic                   171               89  ICA end diastolic                   37                22  PLAQUE MORPHOLOGY:                  Mixed, calcific   N/A  PLAQUE AMOUNT:                      Moderate          N/A  PLAQUE LOCATION:                    Bifurcation, ICA  N/A   IMPRESSION:  1. Right 40-59% ICA stenosis.  2. No recurrent stenosis status post left CEA.  3. Patent ECAs bilaterally.  4. Bilateral antegrade flow in vertebral arteries.   ___________________________________________  Janetta Hora Fields, MD   PB/MEDQ  D:  01/16/2008  T:  01/16/2008  Job:  [id number]

## 2011-03-25 NOTE — Discharge Summary (Signed)
[name], SWALLOW               ACCOUNT NO.:  [id number]   MEDICAL RECORD NO.:  [id number]          PATIENT TYPE:  INP   LOCATION:  3302                         FACILITY:  [facility]   PHYSICIAN:  Janetta Hora. Fields, MD  DATE OF BIRTH:  26-Dec-1933   DATE OF ADMISSION:  06/07/2007  DATE OF DISCHARGE:  06/08/2007                               DISCHARGE SUMMARY   ADMISSION DIAGNOSIS:  Severe left internal carotid artery stenosis,  symptomatic.   DISCHARGE/SECONDARY DIAGNOSES:  1. Severe left internal carotid artery stenosis, symptomatic.  2. Status post left carotid endarterectomy.  3. History of acute left parietal infarct.  4. Hypertension.  5. Coronary artery disease.  6. Moderate to severe lower extremity arterial disease.  7. Postoperative hypotension, improved.  8. History of tonsillectomy, appendectomy, hysterectomy and __________      .  9. Hypokalemia, supplemented.   ALLERGIES:  SULFA.   PROCEDURE:  June 07, 2007 left carotid endarterectomy with Dacron patch  angioplasty by Dr. Fabienne Bruns.   BRIEF HISTORY:  Sydney Jacobs is a 77 year old female who was hospitalized  in early July with complaints of a feeling that her tongue was swollen.  She was noted to have slurred speech by her family.  She was brought  into the emergency department and after initial evaluation, there was no  obvious facial droop or weakness.  She also did not have any difficulty  swallowing or walking at that time.  A CT scan showed chronic  microvascular changes.  However, she did have a MRI of the brain that  showed acute infarct in the left frontoparietal cortex, a small area of  acute infarct in the left occipital lobe and atrophy in the white  matter.  She also had carotid Dopplers which showed bilaterally lateral  mildly severe carotid artery disease with elevated internal carotid  artery velocities.  A MRA showed potentially high-grade stenosis in the  middle right M2 branch, no significant  left-sided disease.  There was  mild atherosclerotic irregularities within the anterior genu of the  right cavernous carotid arteries.  She was ultimately discharged home on  aspirin and antihypertensive and statin therapy.  She was referred as  outpatient for vascular surgery.  She was ultimately seen by Dr. Fabienne Bruns.  She had a carotid duplex confirmed 60-79% stenosis in the left  internal carotid artery and 40-59% stenosis in the right internal  carotid artery.  There was antegrade flow bilaterally of her vertebral  arteries.  Since she had symptomatic left carotid artery stenosis, Dr.  Darrick Penna recommended that she undergo a left carotid endarterectomy to  reduce her risk for future stroke.   HOSPITAL COURSE:  Ms. Guillermo was admitted to [facility] July 08, 2007.  She underwent left carotid endarterectomy.  She was extubated  and neurologically intact, and after short-stay recovery unit was  transferred to sentinel unit 3300 where she remained until discharge.  Her postoperative course was uneventful, other than the fact that she  required dopamine drip overnight for systolic blood pressures in the  80s.  Otherwise, vitals were stable  and __________ her medication.  Neck  showed no sign of hematoma.  Neurologically, she remained intact.  Tongue was midline and moving all extremities strongly and  symmetrically.  Her Foley was pulled and her arterial lines were  discontinued.  Diet was advanced and she began to mobilize.  By late  that afternoon, she had been successfully weaned from her dopamine and  blood pressure now at 127/50.  She was felt appropriate for discharge  home.  Labs at discharge showed a sodium of 139, potassium 3.1 which was  felt __________ by her stating orders.  Blood glucose 122, BUN 10,  creatinine 1.19.  She had a white blood count of 7.9, hemoglobin 10.5,  hematocrit 30.4, platelet count 213.   DISCHARGE MEDICATIONS:  1. Aspirin 81 mg  daily.  2. Simvastatin 20 mg p.o. q.p.m.  3. Diovan 80/12.5 mg 2 a.m.  4. Amlodipine 10 mg p.o. q.p.m.  5. Fish oil 100 mg p.o. q.a.m.   DISCHARGE INSTRUCTIONS:  She may continue with her heart healthy diet,  increase her activities slowly.  She may shower.  __________ .  She will  avoid driving or heavy lifting for the next 2 weeks.  She will follow up  with Dr. Darrick Penna in the vascular veins specials office in approximately 2  weeks.  Our office will contact her regarding specific appointment date  and time.  She is to call sooner if any problems arise.      Jerold Coombe, P.A.      Janetta Hora. Fields, MD  Electronically Signed    AWZ/MEDQ  D:  07/11/2007  T:  07/11/2007  Job:  [id number]   cc:   Janetta Hora. Darrick Penna, MD

## 2011-08-22 LAB — BASIC METABOLIC PANEL
BUN: 10
Chloride: 106
Potassium: 3.1 — ABNORMAL LOW
Sodium: 139

## 2011-08-22 LAB — CBC
HCT: 30.4 — ABNORMAL LOW
HCT: 40.7
Hemoglobin: 10.5 — ABNORMAL LOW
MCHC: 34.2
MCV: 84.3
MCV: 84.3
Platelets: 213
Platelets: 303
RDW: 14
WBC: 7.9

## 2011-08-22 LAB — URINE MICROSCOPIC-ADD ON

## 2011-08-22 LAB — URINALYSIS, ROUTINE W REFLEX MICROSCOPIC
Nitrite: NEGATIVE
Protein, ur: 30 — AB
Specific Gravity, Urine: 1.013
Urobilinogen, UA: 0.2

## 2011-08-22 LAB — COMPREHENSIVE METABOLIC PANEL
Albumin: 4
BUN: 20
Calcium: 10
Creatinine, Ser: 1.31 — ABNORMAL HIGH
Glucose, Bld: 95
Potassium: 3.7
Total Protein: 7.2

## 2011-08-22 LAB — APTT: aPTT: 22 — ABNORMAL LOW

## 2011-08-22 LAB — PROTIME-INR
INR: 1
Prothrombin Time: 13

## 2011-08-22 LAB — ABO/RH: ABO/RH(D): B POS

## 2011-08-22 LAB — TYPE AND SCREEN: ABO/RH(D): B POS

## 2011-08-24 LAB — CBC
HCT: 36.2
Hemoglobin: 13
MCV: 85.4
Platelets: 258
RBC: 4.24
RBC: 4.45
WBC: 8
WBC: 8.4

## 2011-08-24 LAB — CK TOTAL AND CKMB (NOT AT ARMC)
CK, MB: 1.6
CK, MB: 1.9
Relative Index: INVALID
Relative Index: INVALID
Total CK: 38
Total CK: 46

## 2011-08-24 LAB — DIFFERENTIAL
Basophils Absolute: 0
Basophils Relative: 0
Basophils Relative: 1
Eosinophils Absolute: 0.2
Eosinophils Relative: 3
Lymphocytes Relative: 11 — ABNORMAL LOW
Lymphocytes Relative: 15
Lymphs Abs: 0.9
Monocytes Absolute: 1 — ABNORMAL HIGH
Monocytes Relative: 10
Neutro Abs: 6.4
Neutrophils Relative %: 76

## 2011-08-24 LAB — COMPREHENSIVE METABOLIC PANEL
AST: 16
Albumin: 3.2 — ABNORMAL LOW
Alkaline Phosphatase: 68
BUN: 26 — ABNORMAL HIGH
CO2: 28
Chloride: 101
Creatinine, Ser: 1.41 — ABNORMAL HIGH
GFR calc Af Amer: 45 — ABNORMAL LOW
GFR calc non Af Amer: 37 — ABNORMAL LOW
Potassium: 3.4 — ABNORMAL LOW
Total Bilirubin: 0.6

## 2011-08-24 LAB — APTT: aPTT: 24

## 2011-08-24 LAB — URINALYSIS, ROUTINE W REFLEX MICROSCOPIC
Glucose, UA: NEGATIVE
Ketones, ur: NEGATIVE
Leukocytes, UA: NEGATIVE
Protein, ur: 30 — AB
Urobilinogen, UA: 0.2

## 2011-08-24 LAB — URINE CULTURE
Colony Count: NO GROWTH
Culture: NO GROWTH
Special Requests: POSITIVE

## 2011-08-24 LAB — LIPID PANEL: HDL: 45

## 2011-08-24 LAB — BASIC METABOLIC PANEL
Calcium: 8.9
Creatinine, Ser: 1.15
GFR calc Af Amer: 56 — ABNORMAL LOW
Sodium: 138

## 2011-08-24 LAB — CARDIAC PANEL(CRET KIN+CKTOT+MB+TROPI)
Relative Index: INVALID
Relative Index: INVALID
Troponin I: 0.04

## 2011-08-24 LAB — URINE MICROSCOPIC-ADD ON

## 2011-08-24 LAB — PHOSPHORUS: Phosphorus: 2.7

## 2011-08-24 LAB — TSH: TSH: 1.475

## 2011-08-24 LAB — PROTIME-INR
INR: 1.1
Prothrombin Time: 13.6
Prothrombin Time: 13.9

## 2012-06-26 ENCOUNTER — Other Ambulatory Visit (HOSPITAL_COMMUNITY): Payer: Self-pay | Admitting: Family Medicine

## 2012-06-26 DIAGNOSIS — Z139 Encounter for screening, unspecified: Secondary | ICD-10-CM

## 2012-06-28 ENCOUNTER — Ambulatory Visit (HOSPITAL_COMMUNITY)
Admission: RE | Admit: 2012-06-28 | Discharge: 2012-06-28 | Disposition: A | Discharge status: Home / Self Care | Payer: Medicare Other | Source: Ambulatory Visit | Attending: Family Medicine | Admitting: Family Medicine

## 2012-06-28 DIAGNOSIS — M949 Disorder of cartilage, unspecified: Secondary | ICD-10-CM | POA: Insufficient documentation

## 2012-06-28 DIAGNOSIS — M899 Disorder of bone, unspecified: Secondary | ICD-10-CM | POA: Insufficient documentation

## 2012-06-28 DIAGNOSIS — Z139 Encounter for screening, unspecified: Secondary | ICD-10-CM

## 2013-06-17 ENCOUNTER — Other Ambulatory Visit (HOSPITAL_COMMUNITY): Payer: Self-pay | Admitting: Family Medicine

## 2013-06-17 ENCOUNTER — Ambulatory Visit (HOSPITAL_COMMUNITY)
Admission: RE | Admit: 2013-06-17 | Discharge: 2013-06-17 | Disposition: A | Discharge status: Home / Self Care | Payer: Medicare Other | Source: Ambulatory Visit | Attending: Family Medicine | Admitting: Family Medicine

## 2013-06-17 DIAGNOSIS — R51 Headache: Secondary | ICD-10-CM | POA: Insufficient documentation

## 2013-06-17 DIAGNOSIS — Z9181 History of falling: Secondary | ICD-10-CM | POA: Insufficient documentation

## 2014-01-01 ENCOUNTER — Other Ambulatory Visit (HOSPITAL_COMMUNITY): Payer: Self-pay | Admitting: Nephrology

## 2014-01-01 DIAGNOSIS — N183 Chronic kidney disease, stage 3 unspecified: Secondary | ICD-10-CM

## 2014-01-06 ENCOUNTER — Ambulatory Visit (HOSPITAL_COMMUNITY)
Admission: RE | Admit: 2014-01-06 | Discharge: 2014-01-06 | Disposition: A | Discharge status: Home / Self Care | Payer: Medicare HMO | Source: Ambulatory Visit | Attending: Nephrology | Admitting: Nephrology

## 2014-01-06 DIAGNOSIS — Q618 Other cystic kidney diseases: Secondary | ICD-10-CM | POA: Insufficient documentation

## 2014-01-06 DIAGNOSIS — N189 Chronic kidney disease, unspecified: Secondary | ICD-10-CM | POA: Insufficient documentation

## 2014-01-06 DIAGNOSIS — N183 Chronic kidney disease, stage 3 unspecified: Secondary | ICD-10-CM

## 2014-12-23 ENCOUNTER — Other Ambulatory Visit: Payer: Self-pay | Admitting: Dermatology

## 2016-04-28 ENCOUNTER — Ambulatory Visit (HOSPITAL_COMMUNITY)
Admission: RE | Admit: 2016-04-28 | Discharge: 2016-04-28 | Disposition: A | Discharge status: Home / Self Care | Payer: Commercial Managed Care - HMO | Source: Ambulatory Visit | Attending: Family Medicine | Admitting: Family Medicine

## 2016-04-28 ENCOUNTER — Other Ambulatory Visit (HOSPITAL_COMMUNITY): Payer: Self-pay | Admitting: Family Medicine

## 2016-04-28 DIAGNOSIS — R51 Headache: Secondary | ICD-10-CM | POA: Diagnosis present

## 2016-04-28 DIAGNOSIS — R9082 White matter disease, unspecified: Secondary | ICD-10-CM | POA: Diagnosis not present

## 2016-04-28 DIAGNOSIS — G319 Degenerative disease of nervous system, unspecified: Secondary | ICD-10-CM | POA: Diagnosis not present

## 2016-04-28 DIAGNOSIS — W57XXXA Bitten or stung by nonvenomous insect and other nonvenomous arthropods, initial encounter: Secondary | ICD-10-CM | POA: Diagnosis not present

## 2016-04-28 DIAGNOSIS — R519 Headache, unspecified: Secondary | ICD-10-CM

## 2016-05-08 ENCOUNTER — Inpatient Hospital Stay (HOSPITAL_COMMUNITY): Payer: Commercial Managed Care - HMO

## 2016-05-08 ENCOUNTER — Inpatient Hospital Stay (HOSPITAL_COMMUNITY)
Admission: EM | Admit: 2016-05-08 | Discharge: 2016-05-13 | DRG: 304 | Disposition: A | Discharge status: Home / Self Care | Payer: Commercial Managed Care - HMO | Attending: Family Medicine | Admitting: Family Medicine

## 2016-05-08 ENCOUNTER — Emergency Department (HOSPITAL_COMMUNITY): Payer: Commercial Managed Care - HMO

## 2016-05-08 ENCOUNTER — Encounter (HOSPITAL_COMMUNITY): Payer: Self-pay | Admitting: Emergency Medicine

## 2016-05-08 DIAGNOSIS — I5033 Acute on chronic diastolic (congestive) heart failure: Secondary | ICD-10-CM | POA: Diagnosis not present

## 2016-05-08 DIAGNOSIS — T502X5A Adverse effect of carbonic-anhydrase inhibitors, benzothiadiazides and other diuretics, initial encounter: Secondary | ICD-10-CM | POA: Diagnosis present

## 2016-05-08 DIAGNOSIS — R51 Headache: Secondary | ICD-10-CM

## 2016-05-08 DIAGNOSIS — R630 Anorexia: Secondary | ICD-10-CM | POA: Diagnosis present

## 2016-05-08 DIAGNOSIS — W57XXXD Bitten or stung by nonvenomous insect and other nonvenomous arthropods, subsequent encounter: Secondary | ICD-10-CM | POA: Diagnosis not present

## 2016-05-08 DIAGNOSIS — I160 Hypertensive urgency: Secondary | ICD-10-CM | POA: Diagnosis present

## 2016-05-08 DIAGNOSIS — R748 Abnormal levels of other serum enzymes: Secondary | ICD-10-CM | POA: Diagnosis present

## 2016-05-08 DIAGNOSIS — R0602 Shortness of breath: Secondary | ICD-10-CM

## 2016-05-08 DIAGNOSIS — R110 Nausea: Secondary | ICD-10-CM | POA: Diagnosis present

## 2016-05-08 DIAGNOSIS — J9601 Acute respiratory failure with hypoxia: Secondary | ICD-10-CM | POA: Diagnosis not present

## 2016-05-08 DIAGNOSIS — T148 Other injury of unspecified body region: Secondary | ICD-10-CM | POA: Diagnosis present

## 2016-05-08 DIAGNOSIS — Z79899 Other long term (current) drug therapy: Secondary | ICD-10-CM | POA: Diagnosis not present

## 2016-05-08 DIAGNOSIS — I248 Other forms of acute ischemic heart disease: Secondary | ICD-10-CM | POA: Diagnosis present

## 2016-05-08 DIAGNOSIS — I10 Essential (primary) hypertension: Secondary | ICD-10-CM | POA: Diagnosis not present

## 2016-05-08 DIAGNOSIS — Z7982 Long term (current) use of aspirin: Secondary | ICD-10-CM | POA: Diagnosis not present

## 2016-05-08 DIAGNOSIS — Z87891 Personal history of nicotine dependence: Secondary | ICD-10-CM

## 2016-05-08 DIAGNOSIS — N179 Acute kidney failure, unspecified: Secondary | ICD-10-CM | POA: Diagnosis not present

## 2016-05-08 DIAGNOSIS — I447 Left bundle-branch block, unspecified: Secondary | ICD-10-CM | POA: Diagnosis present

## 2016-05-08 DIAGNOSIS — I130 Hypertensive heart and chronic kidney disease with heart failure and stage 1 through stage 4 chronic kidney disease, or unspecified chronic kidney disease: Secondary | ICD-10-CM | POA: Diagnosis present

## 2016-05-08 DIAGNOSIS — Z882 Allergy status to sulfonamides status: Secondary | ICD-10-CM | POA: Diagnosis not present

## 2016-05-08 DIAGNOSIS — J441 Chronic obstructive pulmonary disease with (acute) exacerbation: Secondary | ICD-10-CM | POA: Diagnosis present

## 2016-05-08 DIAGNOSIS — R0600 Dyspnea, unspecified: Secondary | ICD-10-CM | POA: Diagnosis not present

## 2016-05-08 DIAGNOSIS — R7989 Other specified abnormal findings of blood chemistry: Secondary | ICD-10-CM | POA: Diagnosis not present

## 2016-05-08 DIAGNOSIS — Z8673 Personal history of transient ischemic attack (TIA), and cerebral infarction without residual deficits: Secondary | ICD-10-CM | POA: Diagnosis not present

## 2016-05-08 DIAGNOSIS — N183 Chronic kidney disease, stage 3 (moderate): Secondary | ICD-10-CM | POA: Diagnosis present

## 2016-05-08 DIAGNOSIS — N184 Chronic kidney disease, stage 4 (severe): Secondary | ICD-10-CM | POA: Diagnosis present

## 2016-05-08 DIAGNOSIS — R778 Other specified abnormalities of plasma proteins: Secondary | ICD-10-CM | POA: Diagnosis present

## 2016-05-08 DIAGNOSIS — W57XXXA Bitten or stung by nonvenomous insect and other nonvenomous arthropods, initial encounter: Secondary | ICD-10-CM

## 2016-05-08 DIAGNOSIS — R911 Solitary pulmonary nodule: Secondary | ICD-10-CM | POA: Diagnosis present

## 2016-05-08 DIAGNOSIS — R519 Headache, unspecified: Secondary | ICD-10-CM | POA: Diagnosis present

## 2016-05-08 LAB — COMPREHENSIVE METABOLIC PANEL
ALT: 10 U/L — ABNORMAL LOW (ref 14–54)
ANION GAP: 10 (ref 5–15)
AST: 14 U/L — ABNORMAL LOW (ref 15–41)
Albumin: 3.4 g/dL — ABNORMAL LOW (ref 3.5–5.0)
Alkaline Phosphatase: 80 U/L (ref 38–126)
BUN: 24 mg/dL — ABNORMAL HIGH (ref 6–20)
CHLORIDE: 109 mmol/L (ref 101–111)
CO2: 21 mmol/L — AB (ref 22–32)
Calcium: 9.2 mg/dL (ref 8.9–10.3)
Creatinine, Ser: 1.48 mg/dL — ABNORMAL HIGH (ref 0.44–1.00)
GFR calc non Af Amer: 32 mL/min — ABNORMAL LOW (ref >60)
GFR, EST AFRICAN AMERICAN: 38 mL/min — AB (ref >60)
Glucose, Bld: 112 mg/dL — ABNORMAL HIGH (ref 65–99)
Potassium: 3.6 mmol/L (ref 3.5–5.1)
SODIUM: 140 mmol/L (ref 135–145)
Total Bilirubin: 0.9 mg/dL (ref 0.3–1.2)
Total Protein: 6.6 g/dL (ref 6.5–8.1)

## 2016-05-08 LAB — CBC WITH DIFFERENTIAL/PLATELET
BASOS PCT: 1 %
Basophils Absolute: 0.1 10*3/uL (ref 0.0–0.1)
Eosinophils Absolute: 0.3 10*3/uL (ref 0.0–0.7)
Eosinophils Relative: 4 %
HEMATOCRIT: 33.2 % — AB (ref 36.0–46.0)
HEMOGLOBIN: 11 g/dL — AB (ref 12.0–15.0)
LYMPHS ABS: 1 10*3/uL (ref 0.7–4.0)
Lymphocytes Relative: 16 %
MCH: 27.7 pg (ref 26.0–34.0)
MCHC: 33.1 g/dL (ref 30.0–36.0)
MCV: 83.6 fL (ref 78.0–100.0)
MONOS PCT: 10 %
Monocytes Absolute: 0.6 10*3/uL (ref 0.1–1.0)
NEUTROS ABS: 4.3 10*3/uL (ref 1.7–7.7)
NEUTROS PCT: 69 %
PLATELETS: 246 10*3/uL (ref 150–400)
RBC: 3.97 MIL/uL (ref 3.87–5.11)
RDW: 13.7 % (ref 11.5–15.5)
WBC: 6.1 10*3/uL (ref 4.0–10.5)

## 2016-05-08 LAB — TROPONIN I
TROPONIN I: 0.04 ng/mL — AB (ref <0.03)
TROPONIN I: 0.06 ng/mL — AB (ref <0.03)

## 2016-05-08 LAB — BRAIN NATRIURETIC PEPTIDE: B NATRIURETIC PEPTIDE 5: 908 pg/mL — AB (ref 0.0–100.0)

## 2016-05-08 MED ORDER — ACETAMINOPHEN 650 MG RE SUPP: 650.0000 mg | Freq: Four times a day (QID) | RECTAL | Status: DC | PRN

## 2016-05-08 MED ORDER — SODIUM CHLORIDE 0.9% FLUSH
3.0000 mL | Freq: Two times a day (BID) | INTRAVENOUS | Status: DC
  Administered 2016-05-10 – 2016-05-12 (×2): 3 mL via INTRAVENOUS

## 2016-05-08 MED ORDER — HEPARIN SODIUM (PORCINE) 5000 UNIT/ML IJ SOLN
5000.0000 [IU] | Freq: Three times a day (TID) | INTRAMUSCULAR | Status: DC
  Administered 2016-05-08 – 2016-05-10 (×5): 5000 [IU] via SUBCUTANEOUS
  Filled 2016-05-08 (×8): qty 1

## 2016-05-08 MED ORDER — DOXYCYCLINE HYCLATE 100 MG IV SOLR
100.0000 mg | Freq: Two times a day (BID) | INTRAVENOUS | Status: DC
  Administered 2016-05-08 – 2016-05-09 (×2): 100 mg via INTRAVENOUS
  Filled 2016-05-08 (×4): qty 100

## 2016-05-08 MED ORDER — SODIUM CHLORIDE 0.9% FLUSH: 3.0000 mL | INTRAVENOUS | Status: DC | PRN

## 2016-05-08 MED ORDER — ACETAMINOPHEN 325 MG PO TABS: 650.0000 mg | ORAL_TABLET | Freq: Four times a day (QID) | ORAL | Status: DC | PRN

## 2016-05-08 MED ORDER — MORPHINE SULFATE (PF) 2 MG/ML IV SOLN
1.0000 mg | INTRAVENOUS | Status: DC | PRN
  Administered 2016-05-08: 1 mg via INTRAVENOUS
  Filled 2016-05-08: qty 1

## 2016-05-08 MED ORDER — ENSURE ENLIVE PO LIQD
237.0000 mL | Freq: Two times a day (BID) | ORAL | Status: DC
  Administered 2016-05-08 – 2016-05-10 (×3): 237 mL via ORAL

## 2016-05-08 MED ORDER — LOSARTAN POTASSIUM 50 MG PO TABS
50.0000 mg | ORAL_TABLET | Freq: Every day | ORAL | Status: DC
  Administered 2016-05-08 – 2016-05-10 (×3): 50 mg via ORAL
  Filled 2016-05-08 (×4): qty 1

## 2016-05-08 MED ORDER — ONDANSETRON HCL 4 MG PO TABS: 4.0000 mg | ORAL_TABLET | Freq: Four times a day (QID) | ORAL | Status: DC | PRN

## 2016-05-08 MED ORDER — LABETALOL HCL 5 MG/ML IV SOLN
10.0000 mg | Freq: Once | INTRAVENOUS | Status: AC
  Administered 2016-05-08: 10 mg via INTRAVENOUS
  Filled 2016-05-08: qty 4

## 2016-05-08 MED ORDER — ASPIRIN EC 81 MG PO TBEC
81.0000 mg | DELAYED_RELEASE_TABLET | Freq: Every day | ORAL | Status: DC
  Administered 2016-05-08 – 2016-05-13 (×6): 81 mg via ORAL
  Filled 2016-05-08 (×6): qty 1

## 2016-05-08 MED ORDER — SODIUM CHLORIDE 0.9% FLUSH
3.0000 mL | Freq: Two times a day (BID) | INTRAVENOUS | Status: DC
  Administered 2016-05-08 – 2016-05-13 (×5): 3 mL via INTRAVENOUS

## 2016-05-08 MED ORDER — METOPROLOL TARTRATE 25 MG PO TABS
25.0000 mg | ORAL_TABLET | Freq: Two times a day (BID) | ORAL | Status: DC
  Administered 2016-05-08 – 2016-05-13 (×10): 25 mg via ORAL
  Filled 2016-05-08 (×10): qty 1

## 2016-05-08 MED ORDER — ALBUTEROL SULFATE (2.5 MG/3ML) 0.083% IN NEBU
2.5000 mg | INHALATION_SOLUTION | RESPIRATORY_TRACT | Status: DC | PRN
  Administered 2016-05-12: 2.5 mg via RESPIRATORY_TRACT
  Filled 2016-05-08 (×2): qty 3

## 2016-05-08 MED ORDER — HYDRALAZINE HCL 20 MG/ML IJ SOLN
10.0000 mg | Freq: Four times a day (QID) | INTRAMUSCULAR | Status: DC | PRN
  Administered 2016-05-08: 10 mg via INTRAVENOUS
  Filled 2016-05-08: qty 1

## 2016-05-08 MED ORDER — DOXYCYCLINE HYCLATE 100 MG IV SOLR
INTRAVENOUS | Status: AC
  Filled 2016-05-08: qty 100

## 2016-05-08 MED ORDER — ONDANSETRON HCL 4 MG/2ML IJ SOLN
4.0000 mg | Freq: Four times a day (QID) | INTRAMUSCULAR | Status: DC | PRN
  Filled 2016-05-08: qty 2

## 2016-05-08 MED ORDER — ALLOPURINOL 100 MG PO TABS
100.0000 mg | ORAL_TABLET | Freq: Every day | ORAL | Status: DC
  Administered 2016-05-08 – 2016-05-13 (×6): 100 mg via ORAL
  Filled 2016-05-08 (×6): qty 1

## 2016-05-08 MED ORDER — IOPAMIDOL (ISOVUE-370) INJECTION 76%
75.0000 mL | Freq: Once | INTRAVENOUS | Status: AC | PRN
  Administered 2016-05-08: 75 mL via INTRAVENOUS

## 2016-05-08 MED ORDER — SODIUM CHLORIDE 0.9 % IV SOLN: 250.0000 mL | INTRAVENOUS | Status: DC | PRN

## 2016-05-08 MED ORDER — TRAMADOL HCL 50 MG PO TABS: 50.0000 mg | ORAL_TABLET | Freq: Four times a day (QID) | ORAL | Status: DC | PRN

## 2016-05-08 MED ORDER — FUROSEMIDE 10 MG/ML IJ SOLN
20.0000 mg | Freq: Once | INTRAMUSCULAR | Status: AC
  Administered 2016-05-08: 20 mg via INTRAVENOUS
  Filled 2016-05-08: qty 2

## 2016-05-08 MED ORDER — ALBUTEROL SULFATE (2.5 MG/3ML) 0.083% IN NEBU
2.5000 mg | INHALATION_SOLUTION | Freq: Four times a day (QID) | RESPIRATORY_TRACT | Status: DC
  Administered 2016-05-08 – 2016-05-09 (×4): 2.5 mg via RESPIRATORY_TRACT
  Filled 2016-05-08 (×3): qty 3

## 2016-05-08 MED ORDER — AMLODIPINE BESYLATE 5 MG PO TABS
5.0000 mg | ORAL_TABLET | Freq: Every day | ORAL | Status: DC
  Administered 2016-05-08 – 2016-05-10 (×3): 5 mg via ORAL
  Filled 2016-05-08 (×3): qty 1

## 2016-05-08 NOTE — ED Notes (Signed)
Pt reports she has not taken her home BP medications today, MD notified.

## 2016-05-08 NOTE — ED Provider Notes (Signed)
CSN: [id number]     Arrival date & time 05/08/16  0908 History  By signing my name below, I, [name], attest that this documentation has been prepared under the direction and in the presence of Benjiman CoreNathan Jozee Hammer, MD. Electronically Signed: [name], ED Scribe. 05/08/2016. 9:30 AM.   Chief Complaint  Patient presents with  . Shortness of Breath   The history is provided by the patient. No language interpreter was used.  HPI Comments: Sydney Jacobs is a 82 y.o. female who presents to the Emergency Department complaining of gradually worsening, constant, SOB onset one week ago. She reports worsening SOB at night. She reports associated persistent headache, photophobia, nausea, decreased appetite, and intermittent fever. Pt recently completed a 1 week course of doxycycline on 05/04/16 for a tick bite. She has not tried anything else for her symptoms. She denies chest pain, vomiting, diarrhea, rash, numbness, or weakness.   Past Medical History  Diagnosis Date  . Hypertension   . High cholesterol   . Stroke [facility](HCC)    Past Surgical History  Procedure Laterality Date  . Partial hip arthroplasty    . Appendectomy    . Tonsillectomy    . Abdominal hysterectomy    . Hemorrhoid surgery     No family history on file. Social History  Substance Use Topics  . Smoking status: Never Smoker   . Smokeless tobacco: None  . Alcohol Use: No   OB History    No data available     Review of Systems  Constitutional: Positive for fever and appetite change.  Eyes: Positive for photophobia. Negative for visual disturbance.  Respiratory: Positive for shortness of breath.   Cardiovascular: Negative for chest pain.  Gastrointestinal: Positive for nausea. Negative for vomiting and diarrhea.  Neurological: Positive for headaches. Negative for weakness and numbness.  All other systems reviewed and are negative.  Allergies  Sulfa antibiotics  Home Medications   Prior to Admission medications    Medication Sig Start Date End Date Taking? Authorizing Provider  allopurinol (ZYLOPRIM) 100 MG tablet Take 1 tablet by mouth daily. 03/23/16  Yes Historical Provider, MD  amLODipine (NORVASC) 5 MG tablet Take 1 tablet by mouth daily. 03/23/16  Yes Historical Provider, MD  aspirin EC 81 MG tablet Take 81 mg by mouth daily.   Yes Historical Provider, MD  cetirizine (ZYRTEC) 10 MG tablet Take 10 mg by mouth daily.   Yes Historical Provider, MD  losartan (COZAAR) 50 MG tablet Take 1 tablet by mouth daily. 03/23/16  Yes Historical Provider, MD  Vitamin D, Ergocalciferol, (DRISDOL) 50000 units CAPS capsule Take 1 capsule by mouth once a week. Takes on Fridays. 03/23/16  Yes Historical Provider, MD  doxycycline (VIBRAMYCIN) 100 MG capsule Take 1 capsule by mouth 2 (two) times daily. Reported on 05/08/2016 04/28/16   Historical Provider, MD   BP 191/85 mmHg  Pulse 103  Temp(Src) 98.3 F (36.8 C) (Oral)  Resp 22  Ht 4\' 11"  (1.499 m)  Wt 114 lb (51.71 kg)  BMI 23.01 kg/m2  SpO2 94% Physical Exam  Constitutional: She is oriented to person, place, and time. She appears well-developed and well-nourished.  Wearing sunglasses  HENT:  Head: Normocephalic and atraumatic.  Eyes: Conjunctivae and EOM are normal. Pupils are equal, round, and reactive to light.  Neck: Normal range of motion. Neck supple.  No meningismus  Cardiovascular: Normal rate, regular rhythm and normal heart sounds.  Exam reveals no gallop and no friction rub.   No  murmur heard. Pulmonary/Chest: Effort normal and breath sounds normal. She has no wheezes.  Abdominal: Soft. There is no tenderness.  Musculoskeletal: Normal range of motion. She exhibits no edema.  No peripheral edema  Neurological: She is alert and oriented to person, place, and time.  Skin: Skin is warm and dry.  Psychiatric: She has a normal mood and affect. Her behavior is normal.  Nursing note and vitals reviewed.   ED Course  Procedures (including critical care  time) DIAGNOSTIC STUDIES:   COORDINATION OF CARE: 9:29 AM-Discussed treatment plan which includes CT scan and labs with pt at bedside and pt agreed to plan.    Labs Review Labs Reviewed  COMPREHENSIVE METABOLIC PANEL - Abnormal; Notable for the following:    CO2 21 (*)    Glucose, Bld 112 (*)    BUN 24 (*)    Creatinine, Ser 1.48 (*)    Albumin 3.4 (*)    AST 14 (*)    ALT 10 (*)    GFR calc non Af Amer 32 (*)    GFR calc Af Amer 38 (*)    All other components within normal limits  CBC WITH DIFFERENTIAL/PLATELET - Abnormal; Notable for the following:    Hemoglobin 11.0 (*)    HCT 33.2 (*)    All other components within normal limits  TROPONIN I - Abnormal; Notable for the following:    Troponin I 0.04 (*)    All other components within normal limits  BRAIN NATRIURETIC PEPTIDE - Abnormal; Notable for the following:    B Natriuretic Peptide 908.0 (*)    All other components within normal limits    Imaging Review Dg Chest 1 View  05/08/2016  CLINICAL DATA:  Shortness of breath for 2 weeks. EXAM: CHEST 1 VIEW COMPARISON:  09/16/2010 FINDINGS: The heart size and mediastinal contours are within normal limits. Aortic atherosclerosis noted. Both lungs are clear. No evidence of pulmonary infiltrate or pleural effusion. IMPRESSION: No active disease.  Aortic atherosclerosis noted. Electronically Signed   By: Myles RosenthalJohn  Stahl M.D.   On: 05/08/2016 10:14   Ct Head Wo Contrast  05/08/2016  CLINICAL DATA:  Headache, nausea, and photophobia for 1 week. EXAM: CT HEAD WITHOUT CONTRAST TECHNIQUE: Contiguous axial images were obtained from the base of the skull through the vertex without intravenous contrast. COMPARISON:  04/28/2016 FINDINGS: There is no evidence of intracranial hemorrhage, brain edema, or other signs of acute infarction. There is no evidence of intracranial mass lesion or mass effect. No abnormal extraaxial fluid collections are identified. Mild diffuse cerebral atrophy and chronic  small vessel disease are stable in appearance. No evidence hydrocephalus. No skull abnormality identified. IMPRESSION: No acute intracranial abnormality. Stable cerebral atrophy and chronic small vessel disease. Electronically Signed   By: Myles RosenthalJohn  Stahl M.D.   On: 05/08/2016 10:25   I have personally reviewed and evaluated these images and lab results as part of my medical decision-making.   EKG Interpretation   Date/Time:  Sunday May 08 2016 09:19:29 EDT Ventricular Rate:  106 PR Interval:    QRS Duration: 121 QT Interval:  378 QTC Calculation: 502 R Axis:   -29 Text Interpretation:  Sinus tachycardia Atrial premature complexes in  couplets Left bundle branch block Baseline wander in lead(s) V6 Confirmed  by Rubin PayorPICKERING  MD, Harrold DonathNATHAN [phone]([id number]) on 05/08/2016 9:24:37 AM      MDM   Final diagnoses:  Essential hypertension  Nonintractable headache, unspecified chronicity pattern, unspecified headache type    Patient presents  with headache and shortness of breath. Apparently had had tick bite. Occasional fevers. No chest pain. Troponin is minimally elevated. BNP is mildly elevated but chest x-ray does not show CHF. Does not appear to have meningismus. Creatinine is also mildly elevated (we have was from 6 years ago that was normal at that time. Patient states she is not aware that it was elevated all. At this point is we have to worry about end organ damage with elevated troponin. Will start her on her home medicines and give medicines here. Admit.   I personally performed the services described in this documentation, which was scribed in my presence. The recorded information has been reviewed and is accurate.      Benjiman Core, MD 05/08/16 1056

## 2016-05-08 NOTE — Progress Notes (Signed)
Patients would like CT results discussed by MD. Day shift MD has left, paged on call MD to see if he can discuss the results with the family.

## 2016-05-08 NOTE — ED Notes (Signed)
Pt states she has been treated by pcp with 1 week cycle of doxycycline for tick bite. Pt completed doxycycline on Wednesday. Pt c/o continued headache and shortness of breath. nad noted.

## 2016-05-08 NOTE — H&P (Addendum)
History and Physical    MILIA WARTH ZOX:[id number] DOB: 03/01/1934 DOA: 05/08/2016  Referring MD/NP/PA: Benjiman Core, MD  PCP: Colette Ribas, MD  Outpatient Specialists: None Patient coming from: Home  Chief Complaint: Shortness of breath   HPI: Sydney Jacobs is a 82 y.o. female with medical history significant of stroke, hypertension, and HLD presented with complaints of progressively worsening and constant shortness of breath that began yesterday. Patient reports that for the past week to 2 weeks she's had intermittent headaches, photophobia. She attributes this to a recent tick bite that she had 2 weeks ago. She was seen by her primary care physician who prescribed a course of doxycycline. She has completed this but has continued to have symptoms. Interestingly, family reports that approximately one month ago one of her antihypertensive medications was decreased/discontinued. Approximately a week later, she began having headaches, generalized weakness. Since yesterday, she describes shortness of breath and substernal chest pain. She describes a dull ache which is nonradiating. She feels that her extremities may be edematous but did not appear that way at this time.   ED Course: She was evaluated in the ED and noted to be significantly hypertensive. Systolic blood pressures are greater than 200. She did not have any significant leukocytosis. CT scan of the head was unremarkable. Chest x-ray did not show any acute findings. BNP was elevated greater than 900. EKG did not show any acute findings. For severe hypertension, she's been referred for admission.  Review of Systems: As per HPI otherwise 10 point review of systems negative.   Past Medical History  Diagnosis Date  . Hypertension   . High cholesterol   . Stroke [facility])     Past Surgical History  Procedure Laterality Date  . Partial hip arthroplasty    . Appendectomy    . Tonsillectomy    . Abdominal hysterectomy    .  Hemorrhoid surgery       reports that she has never smoked. She does not have any smokeless tobacco history on file. She reports that she does not drink alcohol or use illicit drugs.  Allergies  Allergen Reactions  . Sulfa Antibiotics Other (See Comments)    unknown    Family history: history reviewed. No pertinent findings.  Prior to Admission medications   Medication Sig Start Date End Date Taking? Authorizing Provider  allopurinol (ZYLOPRIM) 100 MG tablet Take 1 tablet by mouth daily. 03/23/16  Yes Historical Provider, MD  amLODipine (NORVASC) 5 MG tablet Take 1 tablet by mouth daily. 03/23/16  Yes Historical Provider, MD  aspirin EC 81 MG tablet Take 81 mg by mouth daily.   Yes Historical Provider, MD  cetirizine (ZYRTEC) 10 MG tablet Take 10 mg by mouth daily.   Yes Historical Provider, MD  losartan (COZAAR) 50 MG tablet Take 1 tablet by mouth daily. 03/23/16  Yes Historical Provider, MD  Vitamin D, Ergocalciferol, (DRISDOL) 50000 units CAPS capsule Take 1 capsule by mouth once a week. Takes on Fridays. 03/23/16  Yes Historical Provider, MD  doxycycline (VIBRAMYCIN) 100 MG capsule Take 1 capsule by mouth 2 (two) times daily. Reported on 05/08/2016 04/28/16   Historical Provider, MD    Physical Exam: Filed Vitals:   05/08/16 1130 05/08/16 1200 05/08/16 1228 05/08/16 1440  BP: 172/91 178/82 192/85 183/86  Pulse: 94 87 99 94  Temp:      TempSrc:      Resp: Height:    (1.499 m)  Weight:   51.71 kg (114 lb)   SpO2: 90% 93% 96% 94%      Constitutional: NAD, calm, comfortable Filed Vitals:   05/08/16 1130 05/08/16 1200 05/08/16 1228 05/08/16 1440  BP: 172/91 178/82 192/85 183/86  Pulse: 94 87 99 94  Temp:      TempSrc:      Resp: 27 20 20 18   Height:   4\' 11"  (1.499 m)   Weight:   51.71 kg (114 lb)   SpO2: 90% 93% 96% 94%   Eyes: PERRL, lids and conjunctivae normal ENMT: Mucous membranes are moist. Posterior pharynx clear of any exudate or lesions.Normal  dentition.  Neck: normal, supple, no masses, no thyromegaly Respiratory: diminished breath sounds, no wheezing, occasional coarse breath sounds at bases. Normal respiratory effort. No accessory muscle use.  Cardiovascular: Regular rate and rhythm, no murmurs / rubs / gallops. No extremity edema. 2+ pedal pulses. No carotid bruits.  Abdomen: no tenderness, no masses palpated. No hepatosplenomegaly. Bowel sounds positive.  Musculoskeletal: no clubbing / cyanosis. No joint deformity upper and lower extremities. Good ROM, no contractures. Normal muscle tone.  Skin: no rashes, lesions, ulcers. No induration Neurologic: CN 2-12 grossly intact. Sensation intact, DTR normal. Strength 5/5 in all 4.  Psychiatric: Normal judgment and insight. Alert and oriented x 3. Normal mood.   Labs on Admission: I have personally reviewed following labs and imaging studies  CBC:  Recent Labs Lab 05/08/16 0937  WBC 6.1  NEUTROABS 4.3  HGB 11.0*  HCT 33.2*  MCV 83.6  PLT 246   Basic Metabolic Panel:  Recent Labs Lab 05/08/16 0937  NA 140  K 3.6  CL 109  CO2 21*  GLUCOSE 112*  BUN 24*  CREATININE 1.48*  CALCIUM 9.2   GFR: Estimated Creatinine Clearance: 21 mL/min (by C-G formula based on Cr of 1.48). Liver Function Tests:  Recent Labs Lab 05/08/16 0937  AST 14*  ALT 10*  ALKPHOS 80  BILITOT 0.9  PROT 6.6  ALBUMIN 3.4*   No results for input(s): LIPASE, AMYLASE in the last 168 hours. No results for input(s): AMMONIA in the last 168 hours. Coagulation Profile: No results for input(s): INR, PROTIME in the last 168 hours. Cardiac Enzymes:  Recent Labs Lab 05/08/16 0937  TROPONINI 0.04*   BNP (last 3 results) No results for input(s): PROBNP in the last 8760 hours. HbA1C: No results for input(s): HGBA1C in the last 72 hours. CBG: No results for input(s): GLUCAP in the last 168 hours. Lipid Profile: No results for input(s): CHOL, HDL, LDLCALC, TRIG, CHOLHDL, LDLDIRECT in the last  72 hours. Thyroid Function Tests: No results for input(s): TSH, T4TOTAL, FREET4, T3FREE, THYROIDAB in the last 72 hours. Anemia Panel: No results for input(s): VITAMINB12, FOLATE, FERRITIN, TIBC, IRON, RETICCTPCT in the last 72 hours. Urine analysis:    Component Value Date/Time   COLORURINE YELLOW 04/22/2010 1413   APPEARANCEUR CLEAR 04/22/2010 1413   LABSPEC 1.015 04/22/2010 1413   PHURINE 5.5 04/22/2010 1413   GLUCOSEU NEGATIVE 04/22/2010 1413   HGBUR TRACE* 04/22/2010 1413   BILIRUBINUR NEGATIVE 04/22/2010 1413   KETONESUR NEGATIVE 04/22/2010 1413   PROTEINUR >300* 04/22/2010 1413   UROBILINOGEN 0.2 04/22/2010 1413   NITRITE NEGATIVE 04/22/2010 1413   LEUKOCYTESUR NEGATIVE 04/22/2010 1413   Sepsis Labs: @LABRCNTIP (procalcitonin:4,lacticidven:4) )No results found for this or any previous visit (from the past 240 hour(s)).   Radiological Exams on Admission: Dg Chest 1 View  05/08/2016  CLINICAL DATA:  Shortness of breath  for 2 weeks. EXAM: CHEST 1 VIEW COMPARISON:  09/16/2010 FINDINGS: The heart size and mediastinal contours are within normal limits. Aortic atherosclerosis noted. Both lungs are clear. No evidence of pulmonary infiltrate or pleural effusion. IMPRESSION: No active disease.  Aortic atherosclerosis noted. Electronically Signed   By: Myles RosenthalJohn  Stahl M.D.   On: 05/08/2016 10:14   Ct Head Wo Contrast  05/08/2016  CLINICAL DATA:  Headache, nausea, and photophobia for 1 week. EXAM: CT HEAD WITHOUT CONTRAST TECHNIQUE: Contiguous axial images were obtained from the base of the skull through the vertex without intravenous contrast. COMPARISON:  04/28/2016 FINDINGS: There is no evidence of intracranial hemorrhage, brain edema, or other signs of acute infarction. There is no evidence of intracranial mass lesion or mass effect. No abnormal extraaxial fluid collections are identified. Mild diffuse cerebral atrophy and chronic small vessel disease are stable in appearance. No evidence  hydrocephalus. No skull abnormality identified. IMPRESSION: No acute intracranial abnormality. Stable cerebral atrophy and chronic small vessel disease. Electronically Signed   By: Myles RosenthalJohn  Stahl M.D.   On: 05/08/2016 10:25    EKG: Independently reviewed. LBBB, no prior comparison  Assessment/Plan Active Problems:   Hypertension   Hypertensive urgency   Tick bite   Elevated troponin   Headache   Dyspnea    1. Hypertensive urgency. Possibly related to recent reduction/discontinuation of one of her antihypertensives. We'll continue her on her home antihypertensive regimen. Add metoprolol. Will use when necessary hydralazine.  2. Dyspnea. Unclear etiology, although suspect related to severely elevated blood pressure. With sudden onset of symptoms, will check CT angios rule out PE. Chest x-ray is relatively unrevealing and chest exam does not show any significant findings. We'll also check echocardiogram to evaluate for CHF. Give her 1 dose of Lasix for now since BNP is elevated. She does not have any evidence of pneumonia at this time. No wheezing. Will use when necessary bronchodilators.  3. Recent tick bite. She has been treated with doxycycline. Family is very concerned that this may be the root cause of all her symptoms. We'll check [facility] spotted fever titers as well as Lyme disease titers. Continue doxycycline for now with low threshold to discontinue.  4. Headache. Suspect this is related to elevated blood pressure. She does not have any signs of meningismus. No fever or leukocytosis. We'll continue to monitor his blood pressure improves.  5. Elevated troponin. Suspect this is demand ischemia in the setting of hypertension. Continue to trend troponins.   DVT prophylaxis: heparin Code Status: full code  Family Communication: discussed with multiple family members at the bedside  Disposition Plan: discharge home once imrpoved Consults called:  Admission status: inpatient,  telemetry   Erick BlinksMemon, Azile Minardi, MD [facility] If 7PM-7AM, please contact night-coverage [email or web address] Password [email or web address]  05/08/2016, 6:33 PM

## 2016-05-08 NOTE — ED Notes (Signed)
CRITICAL VALUE ALERT  Critical value received:  Trop-0.04  Date of notification:  05/08/2016  Time of notification: 1045  Critical value read back:Yes.    Nurse who received alert:  M.Aarron Wierzbicki,RN  MD notified: Dr. Rubin PayorPickering

## 2016-05-08 NOTE — ED Notes (Signed)
Pt states headache is improving.

## 2016-05-08 NOTE — ED Notes (Signed)
Pt o2 sats noted to be decreasing pt place don 2L O2 via [place]. MD notified

## 2016-05-09 ENCOUNTER — Inpatient Hospital Stay (HOSPITAL_COMMUNITY): Payer: Commercial Managed Care - HMO

## 2016-05-09 DIAGNOSIS — N184 Chronic kidney disease, stage 4 (severe): Secondary | ICD-10-CM | POA: Diagnosis present

## 2016-05-09 DIAGNOSIS — R0600 Dyspnea, unspecified: Secondary | ICD-10-CM

## 2016-05-09 LAB — COMPREHENSIVE METABOLIC PANEL
ALBUMIN: 2.8 g/dL — AB (ref 3.5–5.0)
ALK PHOS: 65 U/L (ref 38–126)
ALT: 8 U/L — AB (ref 14–54)
AST: 11 U/L — AB (ref 15–41)
Anion gap: 8 (ref 5–15)
BILIRUBIN TOTAL: 0.7 mg/dL (ref 0.3–1.2)
BUN: 25 mg/dL — AB (ref 6–20)
CALCIUM: 8.7 mg/dL — AB (ref 8.9–10.3)
CO2: 24 mmol/L (ref 22–32)
CREATININE: 1.46 mg/dL — AB (ref 0.44–1.00)
Chloride: 108 mmol/L (ref 101–111)
GFR calc Af Amer: 38 mL/min — ABNORMAL LOW (ref >60)
GFR, EST NON AFRICAN AMERICAN: 33 mL/min — AB (ref >60)
GLUCOSE: 103 mg/dL — AB (ref 65–99)
POTASSIUM: 3.3 mmol/L — AB (ref 3.5–5.1)
Sodium: 140 mmol/L (ref 135–145)
TOTAL PROTEIN: 5.6 g/dL — AB (ref 6.5–8.1)

## 2016-05-09 LAB — ECHOCARDIOGRAM COMPLETE
Area-P 1/2: 3.24 cm2
CHL CUP MV M VEL: 115
E decel time: 156 msec
EERAT: 23.24
FS: 16 % — AB (ref 28–44)
Height: 59 in
IVS/LV PW RATIO, ED: 1
LA diam end sys: 33 mm
LA vol: 44.1 mL
LASIZE: 33 mm
LAVOLA4C: 31.5 mL
LDCA: 3.46 cm2
LV TDI E'LATERAL: 5.98
LV TDI E'MEDIAL: 6.42
LVEEAVG: 23.24
LVEEMED: 23.24
LVELAT: 5.98 cm/s
LVOT diameter: 21 mm
MV Annulus VTI: 41.8 cm
MV Dec: 156
MV VTI: 175 cm
MVG: 6 mmHg
MVPG: 8 mmHg
MVPKAVEL: 156 m/s
MVPKEVEL: 139 m/s
P 1/2 time: 68 ms
PW: 10.8 mm — AB (ref 0.6–1.1)
RV TAPSE: 21.2 mm
Weight: 1823.65 oz

## 2016-05-09 LAB — CBC
HEMATOCRIT: 28.4 % — AB (ref 36.0–46.0)
Hemoglobin: 9.3 g/dL — ABNORMAL LOW (ref 12.0–15.0)
MCH: 27.5 pg (ref 26.0–34.0)
MCHC: 32.7 g/dL (ref 30.0–36.0)
MCV: 84 fL (ref 78.0–100.0)
PLATELETS: 231 10*3/uL (ref 150–400)
RBC: 3.38 MIL/uL — ABNORMAL LOW (ref 3.87–5.11)
RDW: 13.9 % (ref 11.5–15.5)
WBC: 6.5 10*3/uL (ref 4.0–10.5)

## 2016-05-09 LAB — TROPONIN I
TROPONIN I: 0.05 ng/mL — AB (ref <0.03)
TROPONIN I: 0.06 ng/mL — AB (ref <0.03)

## 2016-05-09 MED ORDER — FUROSEMIDE 10 MG/ML IJ SOLN
40.0000 mg | Freq: Once | INTRAMUSCULAR | Status: AC
Start: 2016-05-09 — End: 2016-05-09
  Administered 2016-05-09: 40 mg via INTRAVENOUS
  Filled 2016-05-09: qty 4

## 2016-05-09 MED ORDER — IPRATROPIUM BROMIDE 0.02 % IN SOLN: 0.5000 mg | Freq: Four times a day (QID) | RESPIRATORY_TRACT | Status: DC

## 2016-05-09 MED ORDER — IPRATROPIUM-ALBUTEROL 0.5-2.5 (3) MG/3ML IN SOLN
3.0000 mL | Freq: Four times a day (QID) | RESPIRATORY_TRACT | Status: DC
  Administered 2016-05-09 (×2): 3 mL via RESPIRATORY_TRACT
  Filled 2016-05-09 (×2): qty 3

## 2016-05-09 MED ORDER — ADULT MULTIVITAMIN W/MINERALS CH
1.0000 | ORAL_TABLET | Freq: Every day | ORAL | Status: DC
  Administered 2016-05-09 – 2016-05-13 (×5): 1 via ORAL
  Filled 2016-05-09 (×5): qty 1

## 2016-05-09 MED ORDER — METHYLPREDNISOLONE SODIUM SUCC 125 MG IJ SOLR
60.0000 mg | Freq: Two times a day (BID) | INTRAMUSCULAR | Status: AC
  Administered 2016-05-09 – 2016-05-10 (×4): 60 mg via INTRAVENOUS
  Filled 2016-05-09 (×4): qty 2

## 2016-05-09 MED ORDER — GUAIFENESIN ER 600 MG PO TB12
1200.0000 mg | ORAL_TABLET | Freq: Two times a day (BID) | ORAL | Status: DC
  Administered 2016-05-09 – 2016-05-13 (×8): 1200 mg via ORAL
  Filled 2016-05-09 (×9): qty 2

## 2016-05-09 NOTE — Evaluation (Signed)
Clinical/Bedside Swallow Evaluation Patient Details  Name: Sydney Jacobs H Shappell MRN: [id number] Date of Birth: [birth year]  Today's Date: 05/09/2016 Time: SLP Start Time (ACUTE ONLY): 1542 SLP Stop Time (ACUTE ONLY): 1556 SLP Time Calculation (min) (ACUTE ONLY): 14 min  Past Medical History:  Past Medical History  Diagnosis Date  . Hypertension   . High cholesterol   . Stroke [facility](HCC)    Past Surgical History:  Past Surgical History  Procedure Laterality Date  . Partial hip arthroplasty    . Appendectomy    . Tonsillectomy    . Abdominal hysterectomy    . Hemorrhoid surgery     HPI:  [age] yof with history of stroke, hypertension, and HLD presented with complaints of progressively worsening SOB She also complains of intermittent headaches, photophobia that onset 2 weeks ago that she attributes to recent tick bite. She was started on course of Doxycycline as outpatient   Assessment / Plan / Recommendation Clinical Impression  Pts swallow appearing within functional limits. Oral motor exam unremarkable. No overt signs or symptoms of aspiration with any PO this date. Nursing reports possible pt miscommunication this am with displeasure for liquid supplements instead of swallow difficulty. Recommend diet upgrade to regular consistencies and thin liquids. No further ST needs identified.      Aspiration Risk  Mild aspiration risk    Diet Recommendation     Medication Administration: Whole meds with liquid    Other  Recommendations Oral Care Recommendations: Oral care BID   Follow up Recommendations       Frequency and Duration            Prognosis        Swallow Study   General Date of Onset: 05/08/16 HPI: [age] yof with history of stroke, hypertension, and HLD presented with complaints of progressively worsening SOB She also complains of intermittent headaches, photophobia that onset 2 weeks ago that she attributes to recent tick bite. She was started on course of Doxycycline as  outpatient Type of Study: Bedside Swallow Evaluation Diet Prior to this Study: Nectar-thick liquids;Dysphagia 2 (chopped) Temperature Spikes Noted: No Respiratory Status: Nasal cannula History of Recent Intubation: No Behavior/Cognition: Alert;Cooperative;Pleasant mood Oral Cavity Assessment: Within Functional Limits Oral Cavity - Dentition: Adequate natural dentition Vision: Functional for self-feeding Self-Feeding Abilities: Able to feed self Patient Positioning: Upright in bed Baseline Vocal Quality: Normal Volitional Cough: Strong Volitional Swallow: Able to elicit    Oral/Motor/Sensory Function Overall Oral Motor/Sensory Function: Within functional limits   Ice Chips Ice chips: Not tested   Thin Liquid Thin Liquid: Within functional limits    Nectar Thick Nectar Thick Liquid: Not tested   Honey Thick Honey Thick Liquid: Not tested   Puree Puree: Within functional limits   Solid   GO   Solid: Within functional limits       Marcene Duoshelsea Sumney MA, CCC-SLP Acute Care Speech Language Pathologist    Kennieth RadSumney, Malania Gawthrop E 05/09/2016,4:07 PM

## 2016-05-09 NOTE — Progress Notes (Signed)
*  PRELIMINARY RESULTS* Echocardiogram 2D Echocardiogram has been performed.  Sydney Jacobs 05/09/2016, 4:39 PM 

## 2016-05-09 NOTE — Care Management Important Message (Signed)
Important Message  Patient Details  Name: Sydney Jacobs MRN: [id number] Date of Birth: [birth year]   Medicare Important Message Given:  Yes    Ryler Laskowski, Chrystine OilerSharley Diane, RN 05/09/2016, 11:41 AM

## 2016-05-09 NOTE — Care Management Note (Signed)
Case Management Note  Patient Details  Name: Sydney Jacobs MRN: [id number] Date of Birth: 08/05/1934  Subjective/Objective:  Patient is from home, independent with ADL's. Her PCP is Dr. Phillips OdorGolding. She has [employer] Medicare. Reports no issues obtaining medications.    Action/Plan: Anticipate d/c home with self care. Will follow.    Expected Discharge Date:  05/11/16               Expected Discharge Plan:  Home/Self Care  In-House Referral:  NA  Discharge planning Services  CM Consult  Post Acute Care Choice:  NA Choice offered to:  NA  DME Arranged:    DME Agency:     HH Arranged:    HH Agency:     Status of Service:  In process, will continue to follow  If discussed at Long Length of Stay Meetings, dates discussed:    Additional Comments:  Sydney Jacobs, Sydney OilerSharley Diane, RN 05/09/2016, 11:36 AM

## 2016-05-09 NOTE — Progress Notes (Addendum)
PROGRESS NOTE    Sydney Jacobs  WJX:[id number] DOB: Apr 23, 1934 DOA: 05/08/2016 PCP: Colette RibasGOLDING, JOHN CABOT, MD    Brief Narrative:  [age] yof with history of stroke, hypertension, and HLD presented with complaints of progressively worsening SOB She also complains of intermittent headaches, photophobia that onset 2 weeks ago that she attributes to recent tick bite. She was started on course of Doxycycline as outpatient. She has no evidence of infection so abx has been discontinued and will continue to monitor. In the ED noted to have systolic blood pressure greater than 200 and BNP elevated greater tham 900. She was continued on her home regimen of antihypertensives with the addition of metoprolol on admission with Hydralazine PRN. CT chest revealed evidence of COPD, she has been started on IV steroids and nebs. If not improved consider pulmonology consult.   Assessment & Plan: Active Problems:   Hypertension   Hypertensive urgency   Tick bite   Elevated troponin   Headache   Dyspnea  1. Hypertensive urgency, blood pressures improving. Possibly related to recent reduction/discontinuation of one of her antihypertensives. Continue on home regimen with addition of Metoprolol. Hydralazine PRN.  2. Dyspnea possibly due to COPD exacerbation. CT angio negative for PE but does reveal evidence of COPD. Will start on IV steroid and nebs. Plan for outpatient PFT. No evidence of interstitial edema but did show small pleural effusions. She received one dose of lasix last night and symptomatically felt better although with unimpressive UOP. Will give another dose of lasix today. She was noted to have elevated BNP  follow-up ECHO to evaluate for CHF.  3. Left upper lobe nodule. CT with incidental finding of small nodule on left upper lobe. She will need to follow-up with outpatient oncology to be considered for PET scan and biopsy.   4. Recent tick bite. She completed a course of doxycycline as as outpatient.  [place] spotted fever titers and Lyme disease titers have been sent. She does not have any fever, leukocytosis, rash or any other evidence of ongoing infection. Will discontinue Doxycycline for now and monitor.  5. Headache, likely related to elevated blood pressure. Mild improvement. Continue to monitor as blood pressure improves. Patient/ family feel that headache is related to doxycycline. Will discontinue abx. 6. Elevated troponin, improving. Suspect demand ischemia in the setting of hypertension.  7. CKD stage 3. Creatinine appears to be stable. Continue to follow.   DVT prophylaxis: Heparin  Code Status: Full Family Communication: Family bedside.  Disposition Plan: Anticipate discharge home once improved.    Consultants:   None  Procedures:   None  Antimicrobials:   Doxycycline 7/2>>7/3   Subjective: Still has mild headache. Still coughing but nothing has been produced.  Mild improvement in breathing  With breathing treatment and noticed increased in urination with lasix.   Objective: Filed Vitals:   05/08/16 2135 05/09/16 0202 05/09/16 0646 05/09/16 0844  BP: 168/68  167/71   Pulse: 96  85   Temp: 98.4 F (36.9 C)  98.1 F (36.7 C)   TempSrc: Oral  Oral   Resp: 20  20   Height:      Weight:   51.7 kg (113 lb 15.7 oz)   SpO2: 96% 98% 98% 96%    Intake/Output Summary (Last 24 hours) at 05/09/16 0853 Last data filed at 05/09/16 0852  Gross per 24 hour  Intake    240 ml  Output    875 ml  Net   -635 ml  Filed Weights   05/08/16 0920 05/08/16 1228 05/09/16 0646  Weight: 51.71 kg (114 lb) 51.71 kg (114 lb) 51.7 kg (113 lb 15.7 oz)    Examination:  General exam: NAD Respiratory system: coarse breath sounds bilaterally, diminished. Respiratory effort normal. Cardiovascular system: S1 & S2 heard, RRR. No JVD, murmurs, rubs, gallops or clicks. No pedal edema. Gastrointestinal system: Abdomen is nondistended, soft and nontender. No organomegaly or masses  felt. Normal bowel sounds heard. Central nervous system: Alert and oriented. No focal neurological deficits. Extremities: Symmetric 5 x 5 power. Skin: No rashes, lesions or ulcers Psychiatry: Judgement and insight appear normal. Mood & affect appropriate.     Data Reviewed: I have personally reviewed following labs and imaging studies  CBC:  Recent Labs Lab 05/08/16 0937 05/09/16 0626  WBC 6.1 6.5  NEUTROABS 4.3  --   HGB 11.0* 9.3*  HCT 33.2* 28.4*  MCV 83.6 84.0  PLT 246 231   Basic Metabolic Panel:  Recent Labs Lab 05/08/16 0937 05/09/16 0626  NA 140 140  K 3.6 3.3*  CL 109 108  CO2 21* 24  GLUCOSE 112* 103*  BUN 24* 25*  CREATININE 1.48* 1.46*  CALCIUM 9.2 8.7*   GFR: Estimated Creatinine Clearance: 21.3 mL/min (by C-G formula based on Cr of 1.46). Liver Function Tests:  Recent Labs Lab 05/08/16 0937 05/09/16 0626  AST 14* 11*  ALT 10* 8*  ALKPHOS 80 65  BILITOT 0.9 0.7  PROT 6.6 5.6*  ALBUMIN 3.4* 2.8*   No results for input(s): LIPASE, AMYLASE in the last 168 hours. No results for input(s): AMMONIA in the last 168 hours. Coagulation Profile: No results for input(s): INR, PROTIME in the last 168 hours. Cardiac Enzymes:  Recent Labs Lab 05/08/16 0937 05/08/16 1947 05/09/16 0035 05/09/16 0626  TROPONINI 0.04* 0.06* 0.06* 0.05*   BNP (last 3 results) No results for input(s): PROBNP in the last 8760 hours. HbA1C: No results for input(s): HGBA1C in the last 72 hours. CBG: No results for input(s): GLUCAP in the last 168 hours. Lipid Profile: No results for input(s): CHOL, HDL, LDLCALC, TRIG, CHOLHDL, LDLDIRECT in the last 72 hours. Thyroid Function Tests: No results for input(s): TSH, T4TOTAL, FREET4, T3FREE, THYROIDAB in the last 72 hours. Anemia Panel: No results for input(s): VITAMINB12, FOLATE, FERRITIN, TIBC, IRON, RETICCTPCT in the last 72 hours. Urine analysis:    Component Value Date/Time   COLORURINE YELLOW 04/22/2010 1413    APPEARANCEUR CLEAR 04/22/2010 1413   LABSPEC 1.015 04/22/2010 1413   PHURINE 5.5 04/22/2010 1413   GLUCOSEU NEGATIVE 04/22/2010 1413   HGBUR TRACE* 04/22/2010 1413   BILIRUBINUR NEGATIVE 04/22/2010 1413   KETONESUR NEGATIVE 04/22/2010 1413   PROTEINUR >300* 04/22/2010 1413   UROBILINOGEN 0.2 04/22/2010 1413   NITRITE NEGATIVE 04/22/2010 1413   LEUKOCYTESUR NEGATIVE 04/22/2010 1413   Sepsis Labs: @LABRCNTIP (procalcitonin:4,lacticidven:4)  )No results found for this or any previous visit (from the past 240 hour(s)).       Radiology Studies: Dg Chest 1 View  05/08/2016  CLINICAL DATA:  Shortness of breath for 2 weeks. EXAM: CHEST 1 VIEW COMPARISON:  09/16/2010 FINDINGS: The heart size and mediastinal contours are within normal limits. Aortic atherosclerosis noted. Both lungs are clear. No evidence of pulmonary infiltrate or pleural effusion. IMPRESSION: No active disease.  Aortic atherosclerosis noted. Electronically Signed   By: Myles RosenthalJohn  Stahl M.D.   On: 05/08/2016 10:14   Ct Head Wo Contrast  05/08/2016  CLINICAL DATA:  Headache, nausea, and photophobia for  1 week. EXAM: CT HEAD WITHOUT CONTRAST TECHNIQUE: Contiguous axial images were obtained from the base of the skull through the vertex without intravenous contrast. COMPARISON:  04/28/2016 FINDINGS: There is no evidence of intracranial hemorrhage, brain edema, or other signs of acute infarction. There is no evidence of intracranial mass lesion or mass effect. No abnormal extraaxial fluid collections are identified. Mild diffuse cerebral atrophy and chronic small vessel disease are stable in appearance. No evidence hydrocephalus. No skull abnormality identified. IMPRESSION: No acute intracranial abnormality. Stable cerebral atrophy and chronic small vessel disease. Electronically Signed   By: Myles Rosenthal M.D.   On: 05/08/2016 10:25   Ct Angio Chest Pe W Or Wo Contrast  05/08/2016  CLINICAL DATA:  Shortness of breath for 1 week. EXAM: CT  ANGIOGRAPHY CHEST WITH CONTRAST TECHNIQUE: Multidetector CT imaging of the chest was performed using the standard protocol during bolus administration of intravenous contrast. Multiplanar CT image reconstructions and MIPs were obtained to evaluate the vascular anatomy. CONTRAST:  75 cc Isovue 370 IV COMPARISON:  Radiograph earlier this day. FINDINGS: Cardiovascular: Motion limits detailed assessment of the pulmonary arteries, particularly of the lower lobes, no evidence of filling defects or pulmonary embolus. Calcified and noncalcified atheromatous plaque throughout the thoracic aorta. No thoracic aortic aneurysm. Some of this plaque appears irregular. There are coronary artery calcifications. Minimal reflux of contrast into the IVC. Mediastinum/Nodes: There is an enlarged right hilar node measuring 1.8 cm short axis. Soft tissue density in the left infrahilar region may be a low left hilar node measuring 1.2 cm. Subcarinal node measures 11 mm. Lower anterior paratracheal node also measures 11 mm. Additional lower paratracheal nodes measuring 10 mm short axis. No pericardial effusion. Lungs/Pleura: In the left upper lobe is a 11 x 10 x 10 mm nodule image 36 series 7. Probable spiculated margins, however are motion artifact through this region. Punctate subpleural nodule in the right lower lobe image 58 series 7. Calcified granuloma in the lung apices. Mild background emphysema. Bronchial thickening particularly in the lower lobes, detailed dependent lower lobe evaluation limited by motion. Small pleural effusion with adjacent densities likely atelectasis. Trachea and proximal bronchi are patent. Probable atelectasis in the medial right middle lobe. There are small bilateral pleural effusions with adjacent compressive atelectasis. Upper Abdomen: Cyst in the right upper quadrant likely renal in origin, and measures at least 8.3 cm, cyst described in this region on ultrasound measured 13 cm. Lobular borders of the  spleen. Left adrenal gland tentatively identified without discrete nodule. Right adrenal gland not definitively seen. Tortuosity and atherosclerosis of the upper abdominal aorta. Musculoskeletal: No blastic or destructive lytic lesions. Exaggerated thoracic kyphosis. Review of the MIP images confirms the above findings. IMPRESSION: 1. No pulmonary embolus. 2. Left upper lobe 1 cm pulmonary nodule, partially obscured by motion, however likely spiculated. Nodule is suspicious for primary bronchogenic malignancy. This is at the size limit for PET-CT characterization. There is an enlarged contralateral right hilar node and prominent mediastinal nodes. 3. Mild emphysema. Bronchial thickening is most prominent in the lower lobes, may be acute or chronic. 4. Small pleural effusions with likely adjacent atelectasis. 5. Thoracic aortic atherosclerosis including coronary artery calcifications. These results will be called to the ordering clinician or representative by the Radiologist Assistant, and communication documented in the PACS or zVision Dashboard. Electronically Signed   By: Rubye Oaks M.D.   On: 05/08/2016 19:03        Scheduled Meds: . albuterol  2.5 mg Nebulization Q6H  .  allopurinol  100 mg Oral Daily  . amLODipine  5 mg Oral Daily  . aspirin EC  81 mg Oral Daily  . doxycycline (VIBRAMYCIN) IV  100 mg Intravenous Q12H  . feeding supplement (ENSURE ENLIVE)  237 mL Oral BID BM  . heparin  5,000 Units Subcutaneous Q8H  . losartan  50 mg Oral Daily  . metoprolol tartrate  25 mg Oral BID  . sodium chloride flush  3 mL Intravenous Q12H  . sodium chloride flush  3 mL Intravenous Q12H   Continuous Infusions:    LOS: 1 day    Time spent: 35 minutes     [facility] If 7PM-7AM, please contact night-coverage [email or web address] Password [email or web address] 05/09/2016, 8:53 AM     By signing my name below, I, Zadie Cleverly, attest that this documentation has been prepared under the direction and  in the presence of Erick Blinks, MD. Electronically signed: Zadie Cleverly, Scribe. 05/09/2016 10:36am   I, Dr. Erick Blinks, personally performed the services described in this documentaiton. All medical record entries made by the scribe were at my direction and in my presence. I have reviewed the chart and agree that the record reflects my personal performance and is accurate and complete  Erick Blinks, MD, 05/09/2016 11:28 AM

## 2016-05-09 NOTE — Progress Notes (Signed)
Initial Nutrition Assessment   INTERVENTION:  Regular diet with thin liquids per ST  Nursing to offer snacks between meals as desired   Add MVI daily  Recommend weigh patient on standing scale if she is able    NUTRITION DIAGNOSIS:   Inadequate oral intake related to inability to eat, poor appetite as evidenced by estimated needs.   GOAL:   Patient will meet greater than or equal to 90% of their needs    MONITOR:   PO intake, Labs, Weight trends  REASON FOR ASSESSMENT:   Malnutrition Screening Tool    ASSESSMENT:   Pt has hx of stoke. She says for the past 2 weeks her appetite has been very poor which she associates with recent tick bite and the severe heachaches she was having. Today she feeling more like eating something. ST has cleared her for a  regular diet and the son is going out to get her chicken tenders and french fries. Nurisng has provided her supplemetns today both Ensure Enlive and [employer]in[employer]ost Breeze but patient refuses to dirink them. She is able to feed herself and was encouraged to let nutirtion services know what foods she prefers for her meals so we can potentially intcrease her  overall intake. Limited weight hx available. Expect that her weight has decreased due to her poor appetite the past 2 weeks. Unable to complete full nutrition focused exam at this time.   Recent Labs Lab 05/08/16 0937 05/09/16 0626  NA 140 140  K 3.6 3.3*  CL 109 108  CO2 21* 24  BUN 24* 25*  CREATININE 1.48* 1.46*  CALCIUM 9.2 8.7*  GLUCOSE 112* 103*     Diet Order:  Diet regular Room service appropriate?: Yes; Fluid consistency:: Thin  Skin:   intact  Last BM:   7/3  Height:   Ht Readings from Last 1 Encounters:  05/08/16 4\' 11"  (1.499 m)    Weight:   Wt Readings from Last 1 Encounters:  05/09/16 113 lb 15.7 oz (51.7 kg)    Ideal Body Weight:  45 kg  BMI:  Body mass index is 23.01 kg/(m^2).  Estimated Nutritional Needs:   Kcal:  1300-1500  Protein:   60-65 gr  Fluid:  1.3-1.5 liters daily  EDUCATION NEEDS: none identified    Royann ShiversLynn Buck Mcaffee MS,RD,CSG,LDN Office: [phone] Pager: [phone]

## 2016-05-10 ENCOUNTER — Encounter (HOSPITAL_COMMUNITY): Payer: Self-pay | Admitting: Internal Medicine

## 2016-05-10 DIAGNOSIS — R911 Solitary pulmonary nodule: Secondary | ICD-10-CM | POA: Insufficient documentation

## 2016-05-10 DIAGNOSIS — I10 Essential (primary) hypertension: Secondary | ICD-10-CM | POA: Insufficient documentation

## 2016-05-10 DIAGNOSIS — N179 Acute kidney failure, unspecified: Secondary | ICD-10-CM

## 2016-05-10 LAB — BASIC METABOLIC PANEL
Anion gap: 7 (ref 5–15)
BUN: 37 mg/dL — AB (ref 6–20)
CHLORIDE: 108 mmol/L (ref 101–111)
CO2: 24 mmol/L (ref 22–32)
CREATININE: 1.8 mg/dL — AB (ref 0.44–1.00)
Calcium: 8.9 mg/dL (ref 8.9–10.3)
GFR calc Af Amer: 30 mL/min — ABNORMAL LOW (ref >60)
GFR calc non Af Amer: 26 mL/min — ABNORMAL LOW (ref >60)
Glucose, Bld: 147 mg/dL — ABNORMAL HIGH (ref 65–99)
Potassium: 4.1 mmol/L (ref 3.5–5.1)
SODIUM: 139 mmol/L (ref 135–145)

## 2016-05-10 LAB — CBC
HCT: 28.8 % — ABNORMAL LOW (ref 36.0–46.0)
Hemoglobin: 9.6 g/dL — ABNORMAL LOW (ref 12.0–15.0)
MCH: 28.2 pg (ref 26.0–34.0)
MCHC: 33.3 g/dL (ref 30.0–36.0)
MCV: 84.5 fL (ref 78.0–100.0)
PLATELETS: 283 10*3/uL (ref 150–400)
RBC: 3.41 MIL/uL — ABNORMAL LOW (ref 3.87–5.11)
RDW: 14 % (ref 11.5–15.5)
WBC: 5.8 10*3/uL (ref 4.0–10.5)

## 2016-05-10 LAB — LYME DISEASE DNA BY PCR(BORRELIA BURG): Lyme Disease(B.burgdorferi)PCR: NEGATIVE

## 2016-05-10 MED ORDER — PREDNISONE 20 MG PO TABS
60.0000 mg | ORAL_TABLET | Freq: Every day | ORAL | Status: DC
  Administered 2016-05-11 – 2016-05-13 (×3): 60 mg via ORAL
  Filled 2016-05-10 (×3): qty 3

## 2016-05-10 MED ORDER — IPRATROPIUM-ALBUTEROL 0.5-2.5 (3) MG/3ML IN SOLN
3.0000 mL | Freq: Three times a day (TID) | RESPIRATORY_TRACT | Status: DC
  Administered 2016-05-10 – 2016-05-13 (×10): 3 mL via RESPIRATORY_TRACT
  Filled 2016-05-10 (×11): qty 3

## 2016-05-10 MED ORDER — SODIUM CHLORIDE 0.9 % IV SOLN
INTRAVENOUS | Status: DC
  Administered 2016-05-10: 14:00:00 via INTRAVENOUS

## 2016-05-10 MED ORDER — AMLODIPINE BESYLATE 5 MG PO TABS
10.0000 mg | ORAL_TABLET | Freq: Every day | ORAL | Status: DC
  Administered 2016-05-11 – 2016-05-13 (×3): 10 mg via ORAL
  Filled 2016-05-10 (×3): qty 2

## 2016-05-10 NOTE — Progress Notes (Signed)
Patient ID: [name], female   DOB: January 28, 1934, 82 y.o.   MRN: [id number]                                                                PROGRESS NOTE                                                                                                                                                                                                             Patient Demographics:    Sydney Jacobs, is a 82 y.o. female, DOB - January 28, 1934, WUJ:[id number]  Admit date - 05/08/2016   Admitting Physician Sydney BlinksJehanzeb Memon, MD  Outpatient Primary MD for the patient is Sydney RibasGOLDING, JOHN CABOT, MD  LOS - 2  Outpatient Specialists:  Chief Complaint  Patient presents with  . Shortness of Breath       Brief Narrative  [age] yof with history of stroke, hypertension, and HLD presented with complaints of progressively worsening SOB She also complains of intermittent headaches, photophobia that onset 2 weeks ago that she attributes to recent tick bite. She was started on course of Doxycycline as outpatient. She has no evidence of infection so abx has been discontinued and will continue to monitor. In the ED noted to have systolic blood pressure greater than 200 and BNP elevated greater tham 900. She was continued on her home regimen of antihypertensives with the addition of metoprolol on admission with Hydralazine PRN. CT chest revealed evidence of COPD, she has been started on IV steroids and nebs. If not improved consider pulmonology consult.    Subjective:    Sydney Jacobs today has, been feeling well.  Denies headache, chest pain, sob,  abdominal pain , nausea, vomtting, weakness.     Assessment  & Plan :    Active Problems:   Hypertension   Hypertensive urgency   Tick bite   Elevated troponin   Headache   Dyspnea   CKD (chronic kidney disease) stage 3, GFR 30-59 ml/min   Acute renal failure (HCC)   1.  Hypertension uncontrolled.  D/c losartan due to increasing creatinine Increase amlodipine to  10mg  po qday.  Cont metoprolol  2. ARF ? Dye on CRD (CKD Stage3) Worsening creatinine Check ua Hydrate very gently with ns iv No lasix  2. Pulmonary nodule PET ordered  Non emergent Pulmonary consultation requested  3.  Copd exacerbation Transition from solumedrol to prednisone Off doxycycline Cont current breathing treatments.  PFT ordered  4. Recent tick bite.   Per Dr. Marylynn Sydney note, completed course of doxycycline as outpatient. ? RMSF and lyme disease titers sent   Pt apparently had tick exposure about 2 weeks ago ? Last but thinks that the tick was on for less than 48 hours.   Off doxycycline  5.  Elevated troponin, improving. Suspect demand ischemia in the setting of hypertension.     Code Status : FULL CODE  Family Communication  : w daughter  Disposition Plan  : home  Barriers For Discharge :   Consults  :  pulmonary  Procedures  :   DVT Prophylaxis  :  Heparin, scd  Lab Results  Component Value Date   PLT 283 05/10/2016    Antibiotics  :  doxycycline  Anti-infectives    Start     Dose/Rate Route Frequency Ordered Stop   05/08/16 1930  doxycycline (VIBRAMYCIN) 100 mg in dextrose 5 % 250 mL IVPB  Status:  Discontinued     100 mg 125 mL/hr over 120 Minutes Intravenous Every 12 hours 05/08/16 1812 05/09/16 1111        Objective:   Filed Vitals:   05/10/16 0702 05/10/16 0717 05/10/16 1359 05/10/16 1431  BP:    150/65  Pulse:    86  Temp:    98.3 F (36.8 C)  TempSrc:    Oral  Resp:    20  Height:      Weight: 53.025 kg (116 lb 14.4 oz)     SpO2:  97% 98% 96%    Wt Readings from Last 3 Encounters:  05/10/16 53.025 kg (116 lb 14.4 oz)     Intake/Output Summary (Last 24 hours) at 05/10/16 1451 Last data filed at 05/09/16 1700  Gross per 24 hour  Intake    240 ml  Output      0 ml  Net    240 ml     Physical Exam  Awake Alert, Oriented X 3, No new F.N deficits, Normal affect [place].AT,PERRAL Supple Neck,No JVD, No cervical  lymphadenopathy appriciated.  Symmetrical Chest wall movement, Good air movement bilaterally, CTAB, prolonged exp phase RRR,No Gallops,Rubs or new Murmurs, No Parasternal Heave +ve B.Sounds, Abd Soft, No tenderness, No organomegaly appriciated, No rebound - guarding or rigidity. No Cyanosis, Clubbing or edema, No new Rash or bruise      Data Review:    CBC  Recent Labs Lab 05/08/16 0937 05/09/16 0626 05/10/16 0425  WBC 6.1 6.5 5.8  HGB 11.0* 9.3* 9.6*  HCT 33.2* 28.4* 28.8*  PLT 246 231 283  MCV 83.6 84.0 84.5  MCH 27.7 27.5 28.2  MCHC 33.1 32.7 33.3  RDW 13.7 13.9 14.0  LYMPHSABS 1.0  --   --   MONOABS 0.6  --   --   EOSABS 0.3  --   --   BASOSABS 0.1  --   --     Chemistries   Recent Labs Lab 05/08/16 0937 05/09/16 0626 05/10/16 0425  NA 140 140 139  K 3.6 3.3* 4.1  CL 109 108 108  CO2 21* 24 24  GLUCOSE 112* 103* 147*  BUN 24* 25* 37*  CREATININE 1.48* 1.46* 1.80*  CALCIUM 9.2 8.7* 8.9  AST 14* 11*  --   ALT 10* 8*  --   ALKPHOS 80 65  --  BILITOT 0.9 0.7  --    ------------------------------------------------------------------------------------------------------------------ No results for input(s): CHOL, HDL, LDLCALC, TRIG, CHOLHDL, LDLDIRECT in the last 72 hours.  No results found for: HGBA1C ------------------------------------------------------------------------------------------------------------------ No results for input(s): TSH, T4TOTAL, T3FREE, THYROIDAB in the last 72 hours.  Invalid input(s): FREET3 ------------------------------------------------------------------------------------------------------------------ No results for input(s): VITAMINB12, FOLATE, FERRITIN, TIBC, IRON, RETICCTPCT in the last 72 hours.  Coagulation profile No results for input(s): INR, PROTIME in the last 168 hours.  No results for input(s): DDIMER in the last 72 hours.  Cardiac Enzymes  Recent Labs Lab 05/08/16 1947 05/09/16 0035 05/09/16 0626    TROPONINI 0.06* 0.06* 0.05*   ------------------------------------------------------------------------------------------------------------------    Component Value Date/Time   BNP 908.0* 05/08/2016 0937    Inpatient Medications  Scheduled Meds: . allopurinol  100 mg Oral Daily  . [START ON 05/11/2016] amLODipine  10 mg Oral Daily  . aspirin EC  81 mg Oral Daily  . feeding supplement (ENSURE ENLIVE)  237 mL Oral BID BM  . guaiFENesin  1,200 mg Oral BID  . heparin  5,000 Units Subcutaneous Q8H  . ipratropium-albuterol  3 mL Nebulization TID  . methylPREDNISolone (SOLU-MEDROL) injection  60 mg Intravenous Q12H  . metoprolol tartrate  25 mg Oral BID  . multivitamin with minerals  1 tablet Oral Daily  . sodium chloride flush  3 mL Intravenous Q12H  . sodium chloride flush  3 mL Intravenous Q12H   Continuous Infusions: . sodium chloride 50 mL/hr at 05/10/16 1423   PRN Meds:.sodium chloride, acetaminophen **OR** acetaminophen, albuterol, hydrALAZINE, morphine injection, ondansetron **OR** ondansetron (ZOFRAN) IV, sodium chloride flush, traMADol  Micro Results No results found for this or any previous visit (from the past 240 hour(s)).  Radiology Reports Dg Chest 1 View  05/08/2016  CLINICAL DATA:  Shortness of breath for 2 weeks. EXAM: CHEST 1 VIEW COMPARISON:  09/16/2010 FINDINGS: The heart size and mediastinal contours are within normal limits. Aortic atherosclerosis noted. Both lungs are clear. No evidence of pulmonary infiltrate or pleural effusion. IMPRESSION: No active disease.  Aortic atherosclerosis noted. Electronically Signed   By: Myles Rosenthal M.D.   On: 05/08/2016 10:14   Ct Head Wo Contrast  05/08/2016  CLINICAL DATA:  Headache, nausea, and photophobia for 1 week. EXAM: CT HEAD WITHOUT CONTRAST TECHNIQUE: Contiguous axial images were obtained from the base of the skull through the vertex without intravenous contrast. COMPARISON:  04/28/2016 FINDINGS: There is no evidence of  intracranial hemorrhage, brain edema, or other signs of acute infarction. There is no evidence of intracranial mass lesion or mass effect. No abnormal extraaxial fluid collections are identified. Mild diffuse cerebral atrophy and chronic small vessel disease are stable in appearance. No evidence hydrocephalus. No skull abnormality identified. IMPRESSION: No acute intracranial abnormality. Stable cerebral atrophy and chronic small vessel disease. Electronically Signed   By: Myles Rosenthal M.D.   On: 05/08/2016 10:25   Ct Head Wo Contrast  04/28/2016  CLINICAL DATA:  Headache.  Recent tick bite EXAM: CT HEAD WITHOUT CONTRAST TECHNIQUE: Contiguous axial images were obtained from the base of the skull through the vertex without intravenous contrast. COMPARISON:  CT head 06/17/2013 FINDINGS: Generalized atrophy. Patchy hypodensity throughout the cerebral white matter similar to the prior CT. No acute infarct. Negative for hemorrhage or mass. No shift of the midline structures Negative calvarium IMPRESSION: Atrophy and chronic white matter changes. No acute abnormality and no change from the prior CT. Electronically Signed   By: Marlan Palau M.D.   On:  04/28/2016 16:21   Ct Angio Chest Pe W Or Wo Contrast  05/08/2016  CLINICAL DATA:  Shortness of breath for 1 week. EXAM: CT ANGIOGRAPHY CHEST WITH CONTRAST TECHNIQUE: Multidetector CT imaging of the chest was performed using the standard protocol during bolus administration of intravenous contrast. Multiplanar CT image reconstructions and MIPs were obtained to evaluate the vascular anatomy. CONTRAST:  75 cc Isovue 370 IV COMPARISON:  Radiograph earlier this day. FINDINGS: Cardiovascular: Motion limits detailed assessment of the pulmonary arteries, particularly of the lower lobes, no evidence of filling defects or pulmonary embolus. Calcified and noncalcified atheromatous plaque throughout the thoracic aorta. No thoracic aortic aneurysm. Some of this plaque appears  irregular. There are coronary artery calcifications. Minimal reflux of contrast into the IVC. Mediastinum/Nodes: There is an enlarged right hilar node measuring 1.8 cm short axis. Soft tissue density in the left infrahilar region may be a low left hilar node measuring 1.2 cm. Subcarinal node measures 11 mm. Lower anterior paratracheal node also measures 11 mm. Additional lower paratracheal nodes measuring 10 mm short axis. No pericardial effusion. Lungs/Pleura: In the left upper lobe is a 11 x 10 x 10 mm nodule image 36 series 7. Probable spiculated margins, however are motion artifact through this region. Punctate subpleural nodule in the right lower lobe image 58 series 7. Calcified granuloma in the lung apices. Mild background emphysema. Bronchial thickening particularly in the lower lobes, detailed dependent lower lobe evaluation limited by motion. Small pleural effusion with adjacent densities likely atelectasis. Trachea and proximal bronchi are patent. Probable atelectasis in the medial right middle lobe. There are small bilateral pleural effusions with adjacent compressive atelectasis. Upper Abdomen: Cyst in the right upper quadrant likely renal in origin, and measures at least 8.3 cm, cyst described in this region on ultrasound measured 13 cm. Lobular borders of the spleen. Left adrenal gland tentatively identified without discrete nodule. Right adrenal gland not definitively seen. Tortuosity and atherosclerosis of the upper abdominal aorta. Musculoskeletal: No blastic or destructive lytic lesions. Exaggerated thoracic kyphosis. Review of the MIP images confirms the above findings. IMPRESSION: 1. No pulmonary embolus. 2. Left upper lobe 1 cm pulmonary nodule, partially obscured by motion, however likely spiculated. Nodule is suspicious for primary bronchogenic malignancy. This is at the size limit for PET-CT characterization. There is an enlarged contralateral right hilar node and prominent mediastinal  nodes. 3. Mild emphysema. Bronchial thickening is most prominent in the lower lobes, may be acute or chronic. 4. Small pleural effusions with likely adjacent atelectasis. 5. Thoracic aortic atherosclerosis including coronary artery calcifications. These results will be called to the ordering clinician or representative by the Radiologist Assistant, and communication documented in the PACS or zVision Dashboard. Electronically Signed   By: Rubye OaksMelanie  Ehinger M.D.   On: 05/08/2016 19:03    Time Spent in minutes  30   Sydney Jacobs M.D on 05/10/2016 at 2:51 PM  Between 7am to 7pm - Pager - [phone] After 7pm go to [email or web address] - password [email or web address]  [facility] -  Office  [phone]

## 2016-05-11 DIAGNOSIS — I160 Hypertensive urgency: Principal | ICD-10-CM

## 2016-05-11 DIAGNOSIS — J441 Chronic obstructive pulmonary disease with (acute) exacerbation: Secondary | ICD-10-CM

## 2016-05-11 DIAGNOSIS — I10 Essential (primary) hypertension: Secondary | ICD-10-CM

## 2016-05-11 DIAGNOSIS — N179 Acute kidney failure, unspecified: Secondary | ICD-10-CM

## 2016-05-11 LAB — CBC
HEMATOCRIT: 26.4 % — AB (ref 36.0–46.0)
Hemoglobin: 8.6 g/dL — ABNORMAL LOW (ref 12.0–15.0)
MCH: 27.6 pg (ref 26.0–34.0)
MCHC: 32.6 g/dL (ref 30.0–36.0)
MCV: 84.6 fL (ref 78.0–100.0)
PLATELETS: 301 10*3/uL (ref 150–400)
RBC: 3.12 MIL/uL — AB (ref 3.87–5.11)
RDW: 14.3 % (ref 11.5–15.5)
WBC: 10.6 10*3/uL — ABNORMAL HIGH (ref 4.0–10.5)

## 2016-05-11 LAB — COMPREHENSIVE METABOLIC PANEL
ALK PHOS: 53 U/L (ref 38–126)
ALT: 23 U/L (ref 14–54)
AST: 31 U/L (ref 15–41)
Albumin: 2.9 g/dL — ABNORMAL LOW (ref 3.5–5.0)
Anion gap: 9 (ref 5–15)
BILIRUBIN TOTAL: 0.5 mg/dL (ref 0.3–1.2)
BUN: 61 mg/dL — AB (ref 6–20)
CALCIUM: 8.3 mg/dL — AB (ref 8.9–10.3)
CHLORIDE: 108 mmol/L (ref 101–111)
CO2: 22 mmol/L (ref 22–32)
CREATININE: 2.16 mg/dL — AB (ref 0.44–1.00)
GFR, EST AFRICAN AMERICAN: 24 mL/min — AB (ref >60)
GFR, EST NON AFRICAN AMERICAN: 21 mL/min — AB (ref >60)
Glucose, Bld: 156 mg/dL — ABNORMAL HIGH (ref 65–99)
Potassium: 4.2 mmol/L (ref 3.5–5.1)
Sodium: 139 mmol/L (ref 135–145)
TOTAL PROTEIN: 5.5 g/dL — AB (ref 6.5–8.1)

## 2016-05-11 LAB — URINALYSIS, ROUTINE W REFLEX MICROSCOPIC
BILIRUBIN URINE: NEGATIVE
Glucose, UA: NEGATIVE mg/dL
Hgb urine dipstick: NEGATIVE
Ketones, ur: NEGATIVE mg/dL
NITRITE: NEGATIVE
PH: 5 (ref 5.0–8.0)
Protein, ur: 30 mg/dL — AB
SPECIFIC GRAVITY, URINE: 1.015 (ref 1.005–1.030)

## 2016-05-11 LAB — URINE MICROSCOPIC-ADD ON: RBC / HPF: NONE SEEN RBC/hpf (ref 0–5)

## 2016-05-11 LAB — ROCKY MTN SPOTTED FVR ABS PNL(IGG+IGM)
RMSF IGG: NEGATIVE
RMSF IgM: 0.29 index (ref 0.00–0.89)

## 2016-05-11 MED ORDER — SODIUM CHLORIDE 0.9 % IV SOLN
INTRAVENOUS | Status: DC
  Administered 2016-05-11 (×2): via INTRAVENOUS

## 2016-05-11 NOTE — Progress Notes (Addendum)
PROGRESS NOTE  Sydney Jacobs WUJ:[id number] DOB: 09-17-1934 DOA: 05/08/2016 PCP: Colette RibasGOLDING, JOHN CABOT, MD  Brief Narrative: 82 year old woman presented with shortness of breath, recently treated with doxycycline for tick bite. Admitted for hypertensive urgency.  Assessment/Plan: 1. Uncontrolled hypertension/accelerated hypertension. Resolved. Etiology unclear. 2. AKI superimposed on CKD stage 3. Cr trending up at 2.16. Nonoliguric. Likely secondary to overdiuresis with Lasix. 3. COPD exacerbation. Appears resolved. 4. Pulmonary nodule. Follow-up as an outpatient. 5. Recent tick bite. Patient completed a course of Doxy as an outpatient. Negative for lyme disease. RMSF in process. 6. Elevated troponin. Suspect demand ischemia in the setting of HTN. Echocardiogram reassuring.  7. Chronic diastolic CHF.    Overall subjectively improved. Main issues acute kidney injury at this point.  Continue IV fluids, recheck BMP in the morning. Strict/high oh.  Continue prednisone and bronchodilators.  Outpatient PFT and PET scan recommended.  DVT prophylaxis: Heparin Code Status: Full Family Communication: Husband and multiple family members at bedside Disposition Plan: Home, likely 7/6 if renal function improved  Brendia Sacksaniel Faylene Allerton, MD  [facility] Direct contact: [phone] --Via amion app OR  --[email or web address]; password [email or web address]  7PM-7AM contact night coverage as above 05/11/2016, 7:14 PM  LOS: 3 days   Consultants:  pulmonology  Procedures:  ECHO Study Conclusions  - Left ventricle: The cavity size was normal. Wall thickness was  increased in a pattern of mild LVH. Systolic function was normal.  The estimated ejection fraction was in the range of 55% to 60%.  Wall motion was normal; there were no regional wall motion  abnormalities. Doppler parameters are consistent with abnormal  left ventricular relaxation (grade 1 diastolic dysfunction).  Doppler parameters are  consistent with high ventricular filling  pressure. - Aortic valve: Moderately calcified annulus. Trileaflet;  moderately thickened leaflets. Valve area (VTI): 2.25 cm^2. Valve  area (Vmax): 2.08 cm^2. - Mitral valve: Moderately calcified annulus. Moderately thickened  leaflets . The findings are consistent with mild stenosis. There  was mild regurgitation. - Left atrium: The atrium was mildly to moderately dilated. - Technically adequate study.  Antimicrobials:  none  HPI/Subjective: Feels better. Very good appetite. No pain. No nausea or vomiting. Voiding frequently.  Objective: Filed Vitals:   05/11/16 0547 05/11/16 0756 05/11/16 1401 05/11/16 1430  BP: 147/74   134/60  Pulse: 85   87  Temp: 97.8 F (36.6 C)   98.1 F (36.7 C)  TempSrc: Oral   Oral  Resp: 20   18  Height:      Weight: 53.5 kg (117 lb 15.1 oz)     SpO2: 94% 96% 95% 93%    Intake/Output Summary (Last 24 hours) at 05/11/16 1914 Last data filed at 05/11/16 1700  Gross per 24 hour  Intake   2380 ml  Output    150 ml  Net   2230 ml     Filed Weights   05/09/16 0646 05/10/16 0702 05/11/16 0547  Weight: 51.7 kg (113 lb 15.7 oz) 53.025 kg (116 lb 14.4 oz) 53.5 kg (117 lb 15.1 oz)    Exam:    Constitutional:  . Appears calm and comfortable Respiratory:  . CTA bilaterally, no w/r/r.  . Respiratory effort normal. No retractions or accessory muscle use Cardiovascular:  . RRR, no m/r/g . No LE extremity edema   Tele brief SVT Psychiatric:  . judgement and insight appear normal . Mental status o Mood, affect appropriate  I have personally reviewed following labs and imaging studies:  BUN up to 61,  creatinine of 2.16, remainder CMP unremarkable  Hemoglobin stable 8.6, with WBC slightly elevated 10.6  Scheduled Meds: . allopurinol  100 mg Oral Daily  . amLODipine  10 mg Oral Daily  . aspirin EC  81 mg Oral Daily  . feeding supplement (ENSURE ENLIVE)  237 mL Oral BID BM  . guaiFENesin   1,200 mg Oral BID  . heparin  5,000 Units Subcutaneous Q8H  . ipratropium-albuterol  3 mL Nebulization TID  . metoprolol tartrate  25 mg Oral BID  . multivitamin with minerals  1 tablet Oral Daily  . predniSONE  60 mg Oral Q breakfast  . sodium chloride flush  3 mL Intravenous Q12H  . sodium chloride flush  3 mL Intravenous Q12H   Continuous Infusions: . sodium chloride 100 mL/hr at 05/11/16 1019    Principal Problem:   AKI (acute kidney injury) (HCC) Active Problems:   Tick bite   Elevated troponin   CKD (chronic kidney disease) stage 3, GFR 30-59 ml/min   Essential hypertension   Lung nodule   Accelerated hypertension   COPD exacerbation (HCC)   LOS: 3 days   Time spent 25 minutes  By signing my name below, I, Adron BeneGreylon Gawaluck, attest that this documentation has been prepared under the direction and in the presence of Travell Desaulniers P. Irene LimboGoodrich, MD. Electronically Signed: Adron BeneGreylon Gawaluck, Scribe.  05/11/2016      I personally performed the services described in this documentation. All medical record entries made by the scribe were at my direction. I have reviewed the chart and agree that the record reflects my personal performance and is accurate and complete. Brendia Sacksaniel Amiracle Neises, MD

## 2016-05-11 NOTE — Consult Note (Signed)
Consult requested by: [facility] Consult requested for abnormal chest CT:  HPI: This is a 82 year old who was admitted to the hospital with shortness of breath. This appears to be multifactorial. As part of her workup she had CT of the chest which did not show pulmonary emboli but was a relatively poor study. This did show that she has a 1 cm nodule in her left upper lobe. This may be spiculated but the study was not good quality so it's hard to be certain about that. She also had some lymph nodes were more prominent. Consultation was requested to see what plan should be as far as making a diagnosis. A nuclear PET scan has been ordered but I do not believe that that will be paid for a while she is an inpatient. She also had pulmonary function testing ordered but that should be done as an outpatient also. She has no symptoms related to her breathing now. She has not had chest pain or hemoptysis  Past Medical History  Diagnosis Date  . Hypertension   . High cholesterol   . Stroke [facility])      Family History  Problem Relation Age of Onset  . Hypertension Father   . Hypertension Mother      Social History   Social History  . Marital Status: Married    Spouse Name: N/A  . Number of Children: N/A  . Years of Education: N/A   Social History Main Topics  . Smoking status: Never Smoker   . Smokeless tobacco: None  . Alcohol Use: No  . Drug Use: No  . Sexual Activity: Not Asked   Other Topics Concern  . None   Social History Narrative     ROS: No chest pain, no hemoptysis. No shortness of breath now. No wheezing. No abdominal pain nausea vomiting or diarrhea. She had headache. Otherwise per the history and physical which I have reviewed    Objective: Vital signs in last 24 hours: Temp:  [97.8 F (36.6 C)-98.3 F (36.8 C)] 97.8 F (36.6 C) (07/05 0547) Pulse Rate:  [85-93] 85 (07/05 0547) Resp:  [19-20] 20 (07/05 0547) BP: (140-150)/(58-74) 147/74 mmHg (07/05  0547) SpO2:  [94 %-98 %] 96 % (07/05 0756) Weight:  [53.5 kg (117 lb 15.1 oz)] 53.5 kg (117 lb 15.1 oz) (07/05 0547) Weight change:  Last BM Date: 05/09/16  Intake/Output from previous day:    PHYSICAL EXAM She is awake and alert. Her pupils are reactive nose and throat are clear mucous membranes are moist her neck is supple without masses. Her chest is clear. Her heart is regular without gallop. Abdomen is soft no masses are felt extremities showed no edema. Central nervous system examination grossly intact  Lab Results: Basic Metabolic Panel:  Recent Labs  16/10/96 0425 05/11/16 0415  NA 139 139  K 4.1 4.2  CL 108 108  CO2 24 22  GLUCOSE 147* 156*  BUN 37* 61*  CREATININE 1.80* 2.16*  CALCIUM 8.9 8.3*   Liver Function Tests:  Recent Labs  05/09/16 0626 05/11/16 0415  AST 11* 31  ALT 8* 23  ALKPHOS 65 53  BILITOT 0.7 0.5  PROT 5.6* 5.5*  ALBUMIN 2.8* 2.9*   No results for input(s): LIPASE, AMYLASE in the last 72 hours. No results for input(s): AMMONIA in the last 72 hours. CBC:  Recent Labs  05/08/16 0937  05/10/16 0425 05/11/16 0415  WBC 6.1  < > 5.8 10.6*  NEUTROABS 4.3  --   --   --  HGB 11.0*  < > 9.6* 8.6*  HCT 33.2*  < > 28.8* 26.4*  MCV 83.6  < > 84.5 84.6  PLT 246  < > 283 301  < > = values in this interval not displayed. Cardiac Enzymes:  Recent Labs  05/08/16 1947 05/09/16 0035 05/09/16 0626  TROPONINI 0.06* 0.06* 0.05*   BNP: No results for input(s): PROBNP in the last 72 hours. D-Dimer: No results for input(s): DDIMER in the last 72 hours. CBG: No results for input(s): GLUCAP in the last 72 hours. Hemoglobin A1C: No results for input(s): HGBA1C in the last 72 hours. Fasting Lipid Panel: No results for input(s): CHOL, HDL, LDLCALC, TRIG, CHOLHDL, LDLDIRECT in the last 72 hours. Thyroid Function Tests: No results for input(s): TSH, T4TOTAL, FREET4, T3FREE, THYROIDAB in the last 72 hours. Anemia Panel: No results for input(s):  VITAMINB12, FOLATE, FERRITIN, TIBC, IRON, RETICCTPCT in the last 72 hours. Coagulation: No results for input(s): LABPROT, INR in the last 72 hours. Urine Drug Screen: Drugs of Abuse  No results found for: LABOPIA, COCAINSCRNUR, LABBENZ, AMPHETMU, THCU, LABBARB  Alcohol Level: No results for input(s): ETH in the last 72 hours. Urinalysis: No results for input(s): COLORURINE, LABSPEC, PHURINE, GLUCOSEU, HGBUR, BILIRUBINUR, KETONESUR, PROTEINUR, UROBILINOGEN, NITRITE, LEUKOCYTESUR in the last 72 hours.  Invalid input(s): APPERANCEUR Misc. Labs:   ABGS: No results for input(s): PHART, PO2ART, TCO2, HCO3 in the last 72 hours.  Invalid input(s): PCO2   MICROBIOLOGY: No results found for this or any previous visit (from the past 240 hour(s)).  Studies/Results: No results found.  Medications:  Prior to Admission:  Prescriptions prior to admission  Medication Sig Dispense Refill Last Dose  . allopurinol (ZYLOPRIM) 100 MG tablet Take 1 tablet by mouth daily.   05/07/2016  . amLODipine (NORVASC) 5 MG tablet Take 1 tablet by mouth daily.   05/07/2016  . aspirin EC 81 MG tablet Take 81 mg by mouth daily.   05/07/2016  . cetirizine (ZYRTEC) 10 MG tablet Take 10 mg by mouth daily.   05/07/2016  . losartan (COZAAR) 50 MG tablet Take 1 tablet by mouth daily.   05/07/2016  . Vitamin D, Ergocalciferol, (DRISDOL) 50000 units CAPS capsule Take 1 capsule by mouth once a week. Takes on Fridays.   05/06/2016  . doxycycline (VIBRAMYCIN) 100 MG capsule Take 1 capsule by mouth 2 (two) times daily. Reported on 05/08/2016   Completed Course at Unknown time   Scheduled: . allopurinol  100 mg Oral Daily  . amLODipine  10 mg Oral Daily  . aspirin EC  81 mg Oral Daily  . feeding supplement (ENSURE ENLIVE)  237 mL Oral BID BM  . guaiFENesin  1,200 mg Oral BID  . heparin  5,000 Units Subcutaneous Q8H  . ipratropium-albuterol  3 mL Nebulization TID  . metoprolol tartrate  25 mg Oral BID  . multivitamin with minerals   1 tablet Oral Daily  . predniSONE  60 mg Oral Q breakfast  . sodium chloride flush  3 mL Intravenous Q12H  . sodium chloride flush  3 mL Intravenous Q12H   Continuous: . sodium chloride     LKG:MWNUUVPRN:sodium chloride, acetaminophen **OR** acetaminophen, albuterol, hydrALAZINE, morphine injection, ondansetron **OR** ondansetron (ZOFRAN) IV, sodium chloride flush, traMADol  Assesment: She has a lung nodule. I agree that PET scan is likely the best next step but I think that will have to be done as an outpatient. Since it looked like she had COPD on CT I agree with pulmonary  function testing but that also would best be done as an outpatient Active Problems:   Hypertension   Hypertensive urgency   Tick bite   Elevated troponin   Headache   Dyspnea   CKD (chronic kidney disease) stage 3, GFR 30-59 ml/min   Acute renal failure (HCC)   Essential hypertension   Lung nodule    Plan: I discussed all this with the patient and her family. No change in treatments for now    LOS: 3 days   Justine Dines L 05/11/2016, 8:45 AM

## 2016-05-11 NOTE — Care Management Important Message (Signed)
Important Message  Patient Details  Name: Sydney Jacobs MRN: [id number] Date of Birth: [birth year]   Medicare Important Message Given:  Yes    Dali Kraner, Chrystine OilerSharley Diane, RN 05/11/2016, 8:59 AM

## 2016-05-12 ENCOUNTER — Inpatient Hospital Stay (HOSPITAL_COMMUNITY): Payer: Commercial Managed Care - HMO

## 2016-05-12 DIAGNOSIS — N183 Chronic kidney disease, stage 3 (moderate): Secondary | ICD-10-CM

## 2016-05-12 DIAGNOSIS — J441 Chronic obstructive pulmonary disease with (acute) exacerbation: Secondary | ICD-10-CM

## 2016-05-12 DIAGNOSIS — I10 Essential (primary) hypertension: Secondary | ICD-10-CM

## 2016-05-12 LAB — BASIC METABOLIC PANEL
ANION GAP: 9 (ref 5–15)
BUN: 63 mg/dL — ABNORMAL HIGH (ref 6–20)
CALCIUM: 8.5 mg/dL — AB (ref 8.9–10.3)
CO2: 21 mmol/L — AB (ref 22–32)
Chloride: 109 mmol/L (ref 101–111)
Creatinine, Ser: 1.93 mg/dL — ABNORMAL HIGH (ref 0.44–1.00)
GFR, EST AFRICAN AMERICAN: 27 mL/min — AB (ref >60)
GFR, EST NON AFRICAN AMERICAN: 24 mL/min — AB (ref >60)
Glucose, Bld: 163 mg/dL — ABNORMAL HIGH (ref 65–99)
POTASSIUM: 3.8 mmol/L (ref 3.5–5.1)
Sodium: 139 mmol/L (ref 135–145)

## 2016-05-12 MED ORDER — FUROSEMIDE 10 MG/ML IJ SOLN
20.0000 mg | Freq: Once | INTRAMUSCULAR | Status: AC
  Administered 2016-05-12: 20 mg via INTRAVENOUS
  Filled 2016-05-12: qty 2

## 2016-05-12 NOTE — Consult Note (Signed)
   [facility]   05/12/2016  Tandy Gawlsie H Mcglory [birth year] [id number]  Stopped by patient room to review [facility], patient reports not feeling up to talking at this time, however, her spouse did accept a brochure with [facility] contact information for future reference. Of note, [facility] would not interfere or replace any services arranged by inpatient case management or social work. For questions please contact: Alben SpittleMary E. Albertha GheeNiemczura, RN, BSN, [facility]  [facility] LPHN Hospital Liaison [phone]

## 2016-05-12 NOTE — Discharge Summary (Addendum)
Physician Discharge Summary  Sydney Jacobs ZOX:[id number] DOB: [birth year] DOA: 05/08/2016  PCP: Colette Ribas, MD  Admit date: 05/08/2016 Discharge date: 05/13/2016  Recommendations for Outpatient Follow-up:  1. Uncontrolled hypertension, given acute kidney injury losartan on hold and not started on diuretic. Norvasc was increased in beta blocker added. Consider repeat BMP, follow renal function, could consider restarting ARB or adding diuretic as clinically indicated. 2. Recommend outpatient PET scan to further evaluate pulmonary nodule and follow-up with Dr. Juanetta Gosling pulmonology thereafter 3. Recommend outpatient PFTs   Follow-up Information    Follow up with Colette Ribas, MD. Go on 05/20/2016.   Specialty:  Family Medicine   Why:  Hospital Follow-Up at 11:15 AM   Contact information:   [address] [place] [address] [phone]      Discharge Diagnoses:  1. Uncontrolled hypertension/Accelerated hypertension 2. Acute kidney injury superimposed on chronic kidney disease stage 3 3. COPD exacerbation 4. Acute hypoxic respiratory failure 5. Acute diastolic congestive heart failure 6. Pulmonary nodule 7. Elevated troponin secondary to demand ischemia  Discharge Condition: improved Disposition: discharge home  Diet recommendation: regular  Filed Weights   05/11/16 0547 05/12/16 0622 05/13/16 0554  Weight: 53.5 kg (117 lb 15.1 oz) 54.6 kg (120 lb 5.9 oz) 56 kg (123 lb 7.3 oz)    History of present illness:  82 year old woman presented with shortness of breath, recently treated with doxycycline for tick bite. Admitted for hypertensive urgency.   Hospital Course:  Antihypertensives were adjusted with improvement in blood pressure control. CT chest revealed evidence of emphysema and the patient was treated for presumptive COPD exacerbation. She was also treated with diuretics for possible CHF and developed acute kidney injury secondary to this. She was  treated with IV fluids but did develop some volume overload it was successfully treated with Lasix. She was successfully weaned off oxygen and will follow-up with pulmonology as an outpatient. CT imaging did reveal pulmonary nodule concerning for bronchogenic carcinoma and outpatient PET scan recommended.   1. Uncontrolled hypertension/accelerated hypertension. Resolved. 2. AKI superimposed on CKD stage 3. Improving, creatinine trending downwards. Likely secondary to overdiuresis. 3. COPD exacerbation, appears resolved. Remote history of smoking. Outpatient PFTs recommended 4. Right sided pleural effusion, volume overload, acute on chronic diastolic congestive heart failure. Acute episode of shortness of breath resolved with Lasix. Chest x-ray independently reviewed, given clinical history, pulmonary edema and effusion suggested. No fever and no history to suggest pneumonia. 5. Pulmonary nodule. Follow-up as an outpatient for PET scan. 6. Recent tick bite. Patient completed a course of Doxy as an outpatient. Negative for lyme disease. RMSF negative. 7. Elevated troponin. Secondary to demand ischemia in the setting of HTN. Echocardiogram reassuring.  8. Chronic diastolic CHF. LVEF 55-60%   Antihypertensives adjusted, ARB currently on hold given elevated creatinine. Could also consider diuretic, deferred to PCP at this time. Norvasc increased, started on beta blocker.   Consultants:  pulmonology  Procedures:  ECHO Study Conclusions  - Left ventricle: The cavity size was normal. Wall thickness was  increased in a pattern of mild LVH. Systolic function was normal.  The estimated ejection fraction was in the range of 55% to 60%.  Wall motion was normal; there were no regional wall motion  abnormalities. Doppler parameters are consistent with abnormal  left ventricular relaxation (grade 1 diastolic dysfunction).  Doppler parameters are consistent with high ventricular filling   pressure. - Aortic valve: Moderately calcified annulus. Trileaflet;  moderately thickened leaflets. Valve area (VTI): 2.25  cm^2. Valve  area (Vmax): 2.08 cm^2. - Mitral valve: Moderately calcified annulus. Moderately thickened  leaflets . The findings are consistent with mild stenosis. There  was mild regurgitation. - Left atrium: The atrium was mildly to moderately dilated. - Technically adequate study.  Antimicrobials:  None  Discharge Instructions  Discharge Instructions    Diet - low sodium heart healthy    Complete by:  As directed      Discharge instructions    Complete by:  As directed   Call your physician or seek immediate medical attention for shortness of breath, wheezing, swelling, pain or worsening of condition.     Increase activity slowly    Complete by:  As directed           Discharge Medication List as of 05/13/2016  3:08 PM    START taking these medications   Details  albuterol (PROVENTIL HFA;VENTOLIN HFA) 108 (90 Base) MCG/ACT inhaler Inhale 2 puffs into the lungs every 6 (six) hours as needed for wheezing or shortness of breath. Pharmacy please instruct in proper techinique, Starting 05/13/2016, Until Discontinued, Normal    metoprolol tartrate (LOPRESSOR) 25 MG tablet Take 1 tablet (25 mg total) by mouth 2 (two) times daily., Starting 05/13/2016, Until Discontinued, Normal    predniSONE (DELTASONE) 10 MG tablet Take daily by mouth: 40 mg x3 days, then 20 mg x3 days, then 10 mg x3 days, then stop., Normal      CONTINUE these medications which have CHANGED   Details  amLODipine (NORVASC) 10 MG tablet Take 1 tablet (10 mg total) by mouth daily., Starting 05/13/2016, Until Discontinued, Normal      CONTINUE these medications which have NOT CHANGED   Details  allopurinol (ZYLOPRIM) 100 MG tablet Take 1 tablet by mouth daily., Starting 03/23/2016, Until Discontinued, Historical Med    aspirin EC 81 MG tablet Take 81 mg by mouth daily., Until Discontinued,  Historical Med    cetirizine (ZYRTEC) 10 MG tablet Take 10 mg by mouth daily., Until Discontinued, Historical Med    Vitamin D, Ergocalciferol, (DRISDOL) 50000 units CAPS capsule Take 1 capsule by mouth once a week. Takes on Fridays., Starting 03/23/2016, Until Discontinued, Historical Med      STOP taking these medications     losartan (COZAAR) 50 MG tablet      doxycycline (VIBRAMYCIN) 100 MG capsule        Allergies  Allergen Reactions  . Sulfa Antibiotics Other (See Comments)    unknown    The results of significant diagnostics from this hospitalization (including imaging, microbiology, ancillary and laboratory) are listed below for reference.    Significant Diagnostic Studies: Dg Chest 1 View  05/08/2016  CLINICAL DATA:  Shortness of breath for 2 weeks. EXAM: CHEST 1 VIEW COMPARISON:  09/16/2010 FINDINGS: The heart size and mediastinal contours are within normal limits. Aortic atherosclerosis noted. Both lungs are clear. No evidence of pulmonary infiltrate or pleural effusion. IMPRESSION: No active disease.  Aortic atherosclerosis noted. Electronically Signed   By: Myles RosenthalJohn  Stahl M.D.   On: 05/08/2016 10:14   Ct Head Wo Contrast  05/08/2016  CLINICAL DATA:  Headache, nausea, and photophobia for 1 week. EXAM: CT HEAD WITHOUT CONTRAST TECHNIQUE: Contiguous axial images were obtained from the base of the skull through the vertex without intravenous contrast. COMPARISON:  04/28/2016 FINDINGS: There is no evidence of intracranial hemorrhage, brain edema, or other signs of acute infarction. There is no evidence of intracranial mass lesion or mass effect. No abnormal  extraaxial fluid collections are identified. Mild diffuse cerebral atrophy and chronic small vessel disease are stable in appearance. No evidence hydrocephalus. No skull abnormality identified. IMPRESSION: No acute intracranial abnormality. Stable cerebral atrophy and chronic small vessel disease. Electronically Signed   By: Myles Rosenthal M.D.   On: 05/08/2016 10:25   Ct Angio Chest Pe W Or Wo Contrast  05/08/2016  CLINICAL DATA:  Shortness of breath for 1 week. EXAM: CT ANGIOGRAPHY CHEST WITH CONTRAST TECHNIQUE: Multidetector CT imaging of the chest was performed using the standard protocol during bolus administration of intravenous contrast. Multiplanar CT image reconstructions and MIPs were obtained to evaluate the vascular anatomy. CONTRAST:  75 cc Isovue 370 IV COMPARISON:  Radiograph earlier this day. FINDINGS: Cardiovascular: Motion limits detailed assessment of the pulmonary arteries, particularly of the lower lobes, no evidence of filling defects or pulmonary embolus. Calcified and noncalcified atheromatous plaque throughout the thoracic aorta. No thoracic aortic aneurysm. Some of this plaque appears irregular. There are coronary artery calcifications. Minimal reflux of contrast into the IVC. Mediastinum/Nodes: There is an enlarged right hilar node measuring 1.8 cm short axis. Soft tissue density in the left infrahilar region may be a low left hilar node measuring 1.2 cm. Subcarinal node measures 11 mm. Lower anterior paratracheal node also measures 11 mm. Additional lower paratracheal nodes measuring 10 mm short axis. No pericardial effusion. Lungs/Pleura: In the left upper lobe is a 11 x 10 x 10 mm nodule image 36 series 7. Probable spiculated margins, however are motion artifact through this region. Punctate subpleural nodule in the right lower lobe image 58 series 7. Calcified granuloma in the lung apices. Mild background emphysema. Bronchial thickening particularly in the lower lobes, detailed dependent lower lobe evaluation limited by motion. Small pleural effusion with adjacent densities likely atelectasis. Trachea and proximal bronchi are patent. Probable atelectasis in the medial right middle lobe. There are small bilateral pleural effusions with adjacent compressive atelectasis. Upper Abdomen: Cyst in the right upper  quadrant likely renal in origin, and measures at least 8.3 cm, cyst described in this region on ultrasound measured 13 cm. Lobular borders of the spleen. Left adrenal gland tentatively identified without discrete nodule. Right adrenal gland not definitively seen. Tortuosity and atherosclerosis of the upper abdominal aorta. Musculoskeletal: No blastic or destructive lytic lesions. Exaggerated thoracic kyphosis. Review of the MIP images confirms the above findings. IMPRESSION: 1. No pulmonary embolus. 2. Left upper lobe 1 cm pulmonary nodule, partially obscured by motion, however likely spiculated. Nodule is suspicious for primary bronchogenic malignancy. This is at the size limit for PET-CT characterization. There is an enlarged contralateral right hilar node and prominent mediastinal nodes. 3. Mild emphysema. Bronchial thickening is most prominent in the lower lobes, may be acute or chronic. 4. Small pleural effusions with likely adjacent atelectasis. 5. Thoracic aortic atherosclerosis including coronary artery calcifications. These results will be called to the ordering clinician or representative by the Radiologist Assistant, and communication documented in the PACS or zVision Dashboard. Electronically Signed   By: Rubye Oaks M.D.   On: 05/08/2016 19:03   Dg Chest Port 1 View  05/12/2016  CLINICAL DATA:  Shortness of breath last night.  COPD EXAM: PORTABLE CHEST 1 VIEW COMPARISON:  05/08/2016 FINDINGS: Normal heart size. No pleural effusion or edema. Aortic atherosclerosis. Moderate right pleural effusion is identified and is increased from 05/08/2016. Chronic interstitial coarsening is noted throughout both lungs. There is diminished aeration to the right base compatible with atelectasis and/or pneumonia. IMPRESSION: 1.  New right pleural effusion 2. Worsening aeration to the right lung base. Electronically Signed   By: Signa Kellaylor  Stroud M.D.   On: 05/12/2016 09:28     Labs: Basic Metabolic  Panel:  Recent Labs Lab 05/09/16 0626 05/10/16 0425 05/11/16 0415 05/12/16 0845 05/13/16 0457  NA 140 139 139 139 141  K 3.3* 4.1 4.2 3.8 4.2  CL 108 108 108 109 112*  CO2 24 24 22  21* 23  GLUCOSE 103* 147* 156* 163* 113*  BUN 25* 37* 61* 63* 66*  CREATININE 1.46* 1.80* 2.16* 1.93* 1.87*  CALCIUM 8.7* 8.9 8.3* 8.5* 8.3*   Liver Function Tests:  Recent Labs Lab 05/08/16 0937 05/09/16 0626 05/11/16 0415  AST 14* 11* 31  ALT 10* 8* 23  ALKPHOS 80 65 53  BILITOT 0.9 0.7 0.5  PROT 6.6 5.6* 5.5*  ALBUMIN 3.4* 2.8* 2.9*   CBC:  Recent Labs Lab 05/08/16 0937 05/09/16 0626 05/10/16 0425 05/11/16 0415  WBC 6.1 6.5 5.8 10.6*  NEUTROABS 4.3  --   --   --   HGB 11.0* 9.3* 9.6* 8.6*  HCT 33.2* 28.4* 28.8* 26.4*  MCV 83.6 84.0 84.5 84.6  PLT 246 231 283 301   Cardiac Enzymes:  Recent Labs Lab 05/08/16 0937 05/08/16 1947 05/09/16 0035 05/09/16 0626  TROPONINI 0.04* 0.06* 0.06* 0.05*      Recent Labs  05/08/16 0937  BNP 908.0*    Principal Problem:   AKI (acute kidney injury) (HCC) Active Problems:   Tick bite   Elevated troponin   CKD (chronic kidney disease) stage 3, GFR 30-59 ml/min   Essential hypertension   Lung nodule   Accelerated hypertension   COPD exacerbation (HCC)   Time coordinating discharge: 35 minutes  Signed:  Brendia Sacksaniel Goodrich, MD [facility] 05/13/2016, 5:17 PM  By signing my name below, I, Adron BeneGreylon Gawaluck, attest that this documentation has been prepared under the direction and in the presence of Daniel P. Irene LimboGoodrich, MD. Electronically Signed: Adron BeneGreylon Gawaluck, Scribe.  05/13/2016   I personally performed the services described in this documentation. All medical record entries made by the scribe were at my direction. I have reviewed the chart and agree that the record reflects my personal performance and is accurate and complete. Brendia Sacksaniel Goodrich, MD    12:40pm

## 2016-05-12 NOTE — Progress Notes (Signed)
She had hoped to be discharged this morning but had an episode early this morning of severe shortness of breath. By description it sounds like she may have had flash pulmonary edema or something similar to that. She is still short of breath now but better.  She is awake and alert. Her respirations are about 20 and mildly labored. Her chest is clear without rales or wheezing. Her abdomen is soft. Extremities showed no edema.  She had another episode of shortness of breath. This could be CHF or worsening of her COPD exacerbation. I'm going to have her get a chest x-ray this morning to try to help sort this out.

## 2016-05-12 NOTE — Progress Notes (Signed)
PROGRESS NOTE  Sydney Jacobs [id number] DOB: January 27, 1934 DOA: 05/08/2016 PCP: Colette RibasGOLDING, JOHN CABOT, MD  Brief Narrative: 82 year old woman presented with shortness of breath, recently treated with doxycycline for tick bite. Admitted for hypertensive urgency. Pulmonology has been consulted and recommends outpatient PET scan. CXR revealed new right pleural effusion and she has had worsening respiratory function.   Assessment/Plan: 1. Uncontrolled hypertension/accelerated hypertension. Resolved. 2. AKI superimposed on CKD stage 3. Improving, creatinine trending downwards. 3. COPD exacerbation, continues to improve. Outpatient PFTs recommended 4. Right sided pleural effusion, volume overload, acute on chronic diastolic congestive heart failure. Acute episode of shortness of breath last night improved with Lasix. Chest x-ray independently reviewed, given clinical history, pulmonary edema and effusion suggested. No fever and no history to suggest pneumonia at this point. Monitor clinically. 5. Pulmonary nodule. Follow-up as an outpatient for PET scan. 6. Recent tick bite. Patient completed a course of Doxy as an outpatient. Negative for lyme disease. RMSF negative. 7. Elevated troponin. Suspect demand ischemia in the setting of HTN. Echocardiogram reassuring.  8. Chronic diastolic CHF. LVEF 55-60%   Episode last night of shortness of breath, volume overload, responding to Lasix. Respiratory status improved today. Repeat 1 dose of Lasix today and follow clinically. Repeat BMP in the morning.  Continue prednisone, bronchodilators  If renal function stable and respiratory status improved 7/7, likely discharge home  DVT prophylaxis: Heparin Code Status: Full Family Communication: Husband and daughter-in-law at bedside  Disposition Plan: Home once improved  Sydney Sacksaniel Goodrich, MD  [facility] Direct contact: [phone] --Via amion app OR  --[email or web address]; password [email or web address]  7PM-7AM  contact night coverage as above 05/12/2016, 6:47 AM  LOS: 4 days   Consultants:  pulmonology  Procedures:  ECHO Study Conclusions  - Left ventricle: The cavity size was normal. Wall thickness was  increased in a pattern of mild LVH. Systolic function was normal.  The estimated ejection fraction was in the range of 55% to 60%.  Wall motion was normal; there were no regional wall motion  abnormalities. Doppler parameters are consistent with abnormal  left ventricular relaxation (grade 1 diastolic dysfunction).  Doppler parameters are consistent with high ventricular filling  pressure. - Aortic valve: Moderately calcified annulus. Trileaflet;  moderately thickened leaflets. Valve area (VTI): 2.25 cm^2. Valve  area (Vmax): 2.08 cm^2. - Mitral valve: Moderately calcified annulus. Moderately thickened  leaflets . The findings are consistent with mild stenosis. There  was mild regurgitation. - Left atrium: The atrium was mildly to moderately dilated. - Technically adequate study.  Antimicrobials:  none  HPI/Subjective: Sudden onset shortness of breath last night. Improved after administration of Lasix last night. Still has some shortness of breath. Per daughter, patient had been wheezing last night during sleep.   Objective: Filed Vitals:   05/11/16 1938 05/11/16 2147 05/12/16 0357 05/12/16 0622  BP:  140/71  135/62  Pulse:  88  86  Temp:  98.1 F (36.7 C)  98.1 F (36.7 C)  TempSrc:  Oral  Oral  Resp:  18  20  Height:      Weight:    54.6 kg (120 lb 5.9 oz)  SpO2: 95% 94% 93% 94%    Intake/Output Summary (Last 24 hours) at 05/12/16 0647 Last data filed at 05/11/16 2148  Gross per 24 hour  Intake   2380 ml  Output    250 ml  Net   2130 ml     Filed Weights   05/10/16 0702 05/11/16 0547 05/12/16 0622  Weight:  53.025 kg (116 lb 14.4 oz) 53.5 kg (117 lb 15.1 oz) 54.6 kg (120 lb 5.9 oz)    Exam: Constitutional:  . Appears calm and  comfortable Respiratory:  . crackles posteriorly bases bilaterally, lungs are otherwise clear. No rhonchi or wheezing. . Mild increased respiratory effort but no retractions Cardiovascular:  . RRR, no m/r/g . 1+ bilateral ankle edema Psychiatric:  . judgement and insight appear normal . Mental status o Mood, affect appropriate  I have personally reviewed following labs and imaging studies:  Cr improved, 1.93  CXR shows new right sided pleural effusion and atelectasis.  Scheduled Meds: . allopurinol  100 mg Oral Daily  . amLODipine  10 mg Oral Daily  . aspirin EC  81 mg Oral Daily  . feeding supplement (ENSURE ENLIVE)  237 mL Oral BID BM  . guaiFENesin  1,200 mg Oral BID  . heparin  5,000 Units Subcutaneous Q8H  . ipratropium-albuterol  3 mL Nebulization TID  . metoprolol tartrate  25 mg Oral BID  . multivitamin with minerals  1 tablet Oral Daily  . predniSONE  60 mg Oral Q breakfast  . sodium chloride flush  3 mL Intravenous Q12H  . sodium chloride flush  3 mL Intravenous Q12H   Continuous Infusions:    Principal Problem:   AKI (acute kidney injury) (HCC) Active Problems:   Tick bite   Elevated troponin   CKD (chronic kidney disease) stage 3, GFR 30-59 ml/min   Essential hypertension   Lung nodule   Accelerated hypertension   COPD exacerbation (HCC)   LOS: 4 days   Time spent 25 minutes  By signing my name below, I, Sydney Jacobs, attest that this documentation has been prepared under the direction and in the presence of Sydney P. Irene LimboGoodrich, MD. Electronically Signed: Adron BeneGreylon Jacobs, Scribe.  05/12/2016 12:05pm  I personally performed the services described in this documentation. All medical record entries made by the scribe were at my direction. I have reviewed the chart and agree that the record reflects my personal performance and is accurate and complete. Sydney Sacksaniel Goodrich, MD

## 2016-05-12 NOTE — Progress Notes (Signed)
Patient become very SOB, and in distress, received treatment from RT, paged on call MD, will follow any new orders received and continue to monitor.

## 2016-05-12 NOTE — Progress Notes (Signed)
Called by nurse regarding SOB.  Patient got up to the bathroom and was quite SOB.  PE concerning for crackles.  Nurse turned off IVF and called MD.  Chart reviewed, it appears the patient has a very tenuous fluid balance.  Will give 20 mg IV Lasix x 1 now and discontinue IVF.  If this is unsuccessful and/or patient does not return to baseline, I have asked the RN to call back so that I can come see the patient.    Georgana CurioJennifer E. Avrie Kedzierski, M.D.

## 2016-05-13 ENCOUNTER — Encounter (HOSPITAL_COMMUNITY): Payer: Self-pay | Admitting: Family Medicine

## 2016-05-13 DIAGNOSIS — R911 Solitary pulmonary nodule: Secondary | ICD-10-CM

## 2016-05-13 DIAGNOSIS — R7989 Other specified abnormal findings of blood chemistry: Secondary | ICD-10-CM

## 2016-05-13 LAB — BASIC METABOLIC PANEL
ANION GAP: 6 (ref 5–15)
BUN: 66 mg/dL — ABNORMAL HIGH (ref 6–20)
CHLORIDE: 112 mmol/L — AB (ref 101–111)
CO2: 23 mmol/L (ref 22–32)
Calcium: 8.3 mg/dL — ABNORMAL LOW (ref 8.9–10.3)
Creatinine, Ser: 1.87 mg/dL — ABNORMAL HIGH (ref 0.44–1.00)
GFR calc Af Amer: 28 mL/min — ABNORMAL LOW (ref >60)
GFR calc non Af Amer: 24 mL/min — ABNORMAL LOW (ref >60)
GLUCOSE: 113 mg/dL — AB (ref 65–99)
POTASSIUM: 4.2 mmol/L (ref 3.5–5.1)
Sodium: 141 mmol/L (ref 135–145)

## 2016-05-13 MED ORDER — PREDNISONE 10 MG PO TABS: ORAL_TABLET | ORAL | Status: DC

## 2016-05-13 MED ORDER — AMLODIPINE BESYLATE 10 MG PO TABS: 10.0000 mg | ORAL_TABLET | Freq: Every day | ORAL | Status: DC

## 2016-05-13 MED ORDER — METOPROLOL TARTRATE 25 MG PO TABS: 25.0000 mg | ORAL_TABLET | Freq: Two times a day (BID) | ORAL | Status: DC

## 2016-05-13 MED ORDER — ALBUTEROL SULFATE HFA 108 (90 BASE) MCG/ACT IN AERS: 2.0000 | INHALATION_SPRAY | Freq: Four times a day (QID) | RESPIRATORY_TRACT | Status: DC | PRN

## 2016-05-13 NOTE — Care Management Important Message (Signed)
Important Message  Patient Details  Name: Sydney Jacobs MRN: [id number] Date of Birth: 1934/08/30   Medicare Important Message Given:  Yes    Luz Mares, Chrystine OilerSharley Diane, RN 05/13/2016, 10:10 AM

## 2016-05-13 NOTE — Progress Notes (Signed)
She says she feels much better. She hopes to go home. Her breathing is better. She looks more comfortable. Her chest is clear.  I think from a strictly pulmonary point of view she is probably okay for discharge. She may need oxygen and needs to be evaluated for that.  She will need outpatient PET scan and will also need outpatient pulmonary function testing. I will plan to sign off and will see her after PET scan is done

## 2016-05-13 NOTE — Progress Notes (Signed)
SATURATION QUALIFICATIONS: (This note is used to comply with regulatory documentation for home oxygen)  Patient Saturations on Room Air at Rest = 94%  Patient Saturations on Room Air while Ambulating = 90%  Please briefly explain why patient needs home oxygen: pt did have to take breaks while walking in hallway, SpO2% down to 90% while ambulating on Room Air.  Pt back to bed after walk, SpO2-92%

## 2016-05-13 NOTE — Progress Notes (Signed)
Pt IV Removed, tolerated well.  Reviewed discharge instructions with pt and family, answered questions at this time. Pt discharging home with family.

## 2016-05-13 NOTE — Progress Notes (Signed)
PROGRESS NOTE  Sydney Jacobs WJX:[id number] DOB: [birth year] DOA: 05/08/2016 PCP: Colette RibasGOLDING, JOHN CABOT, MD  Brief Narrative: 82 year old woman presented with shortness of breath, recently treated with doxycycline for tick bite. Admitted for hypertensive urgency. Pulmonology has been consulted and recommends outpatient PET scan. CXR revealed new right pleural effusion and she has had worsening respiratory function, but this has improved with lasix. She has improved overall and is being weaned off oxygen. She will be discharged home later today with bronchodilator.s.  Assessment/Plan: 1. Uncontrolled hypertension/accelerated hypertension. Resolved. 2. AKI superimposed on CKD stage 3. Improving, creatinine trending downwards. Likely secondary to overdiuresis. 3. COPD exacerbation, appears resolved. Remote history of smoking. Outpatient PFTs recommended 4. Right sided pleural effusion, volume overload, acute on chronic diastolic congestive heart failure. Acute episode of shortness of breath resolved with Lasix. Chest x-ray independently reviewed, given clinical history, pulmonary edema and effusion suggested. No fever and no history to suggest pneumonia. 5. Pulmonary nodule. Follow-up as an outpatient for PET scan. 6. Recent tick bite. Patient completed a course of Doxy as an outpatient. Negative for lyme disease. RMSF negative. 7. Elevated troponin. Secondary to demand ischemia in the setting of HTN. Echocardiogram reassuring.  8. Chronic diastolic CHF. LVEF 55-60%   She's much improved after Lasix. She is not on any diuretic as an outpatient. She has been weaned off oxygen.  Plan for discharge home on steroid taper.  Antihypertensives adjusted, ARB currently on hold given elevated creatinine. Could also consider diuretic, deferred to PCP at this time. Norvasc increased, started on beta blocker.  Follow pulmonary with outpatient with PET scan and recommend PFTs as an outpatient. Follow-up with  Dr. AS an outpatient.  DVT prophylaxis: Heparin Code Status: Full Family Communication: discussed with patient and family present at bedside. Disposition Plan: Home once improved  Brendia Sacksaniel Khi Mcmillen, MD  [facility] Direct contact: [phone] --Via amion app OR  --[email or web address]; password [email or web address]  7PM-7AM contact night coverage as above 05/13/2016, 6:56 AM  LOS: 5 days   Consultants:  pulmonology  Procedures:  ECHO Study Conclusions  - Left ventricle: The cavity size was normal. Wall thickness was  increased in a pattern of mild LVH. Systolic function was normal.  The estimated ejection fraction was in the range of 55% to 60%.  Wall motion was normal; there were no regional wall motion  abnormalities. Doppler parameters are consistent with abnormal  left ventricular relaxation (grade 1 diastolic dysfunction).  Doppler parameters are consistent with high ventricular filling  pressure. - Aortic valve: Moderately calcified annulus. Trileaflet;  moderately thickened leaflets. Valve area (VTI): 2.25 cm^2. Valve  area (Vmax): 2.08 cm^2. - Mitral valve: Moderately calcified annulus. Moderately thickened  leaflets . The findings are consistent with mild stenosis. There  was mild regurgitation. - Left atrium: The atrium was mildly to moderately dilated. - Technically adequate study.  Antimicrobials:  none  HPI/Subjective: Feels improved. Slept well. Breathing and eating well. Per family no wheeze, but still gets winded with exertion. She does not use nebs at home.  Objective: Filed Vitals:   05/12/16 1431 05/12/16 1922 05/12/16 2132 05/13/16 0554  BP:   138/66 158/72  Pulse:  75 74 84  Temp:   97.5 F (36.4 C) 97.6 F (36.4 C)  TempSrc:   Oral Oral  Resp:  16 20 18   Height:      Weight:    56 kg (123 lb 7.3 oz)  SpO2: 98% 98% 97% 95%    Intake/Output Summary (Last 24 hours) at  05/13/16 0656 Last data filed at 05/13/16 0558  Gross per 24 hour    Intake    963 ml  Output   1650 ml  Net   -687 ml     Filed Weights   05/11/16 0547 05/12/16 0622 05/13/16 0554  Weight: 53.5 kg (117 lb 15.1 oz) 54.6 kg (120 lb 5.9 oz) 56 kg (123 lb 7.3 oz)    Exam: Constitutional:  . Appears calm and comfortable Respiratory:  . Few inspiratory crackles. . Normal effort. Speaks in full sentences.  Cardiovascular:  . RRR, no m/r/g . 1+ bilateral LE extremity edema   Musculoskeletal:  o Moves all extremities Psychiatric:  . judgement and insight appear normal . Mental status o Mood, affect appropriate  I have personally reviewed following labs and imaging studies:  Cr improved, 1.87  Blood sugars stable  Scheduled Meds: . allopurinol  100 mg Oral Daily  . amLODipine  10 mg Oral Daily  . aspirin EC  81 mg Oral Daily  . feeding supplement (ENSURE ENLIVE)  237 mL Oral BID BM  . guaiFENesin  1,200 mg Oral BID  . heparin  5,000 Units Subcutaneous Q8H  . ipratropium-albuterol  3 mL Nebulization TID  . metoprolol tartrate  25 mg Oral BID  . multivitamin with minerals  1 tablet Oral Daily  . predniSONE  60 mg Oral Q breakfast  . sodium chloride flush  3 mL Intravenous Q12H  . sodium chloride flush  3 mL Intravenous Q12H   Continuous Infusions:    Principal Problem:   AKI (acute kidney injury) (HCC) Active Problems:   Tick bite   Elevated troponin   CKD (chronic kidney disease) stage 3, GFR 30-59 ml/min   Essential hypertension   Lung nodule   Accelerated hypertension   COPD exacerbation (HCC)   LOS: 5 days      By signing my name below, I, Adron BeneGreylon Gawaluck, attest that this documentation has been prepared under the direction and in the presence of Shamikia Linskey P. Irene LimboGoodrich, MD. Electronically Signed: Adron BeneGreylon Gawaluck, Scribe.  05/13/2016 12:40pm   I personally performed the services described in this documentation. All medical record entries made by the scribe were at my direction. I have reviewed the chart and agree that the  record reflects my personal performance and is accurate and complete. Brendia Sacksaniel Chimaobi Casebolt, MD

## 2016-05-14 NOTE — Progress Notes (Signed)
Pts son Sydney Jacobs called to find out if his mother could take Mucinex, the patient wants it but it was not on the discharge list.  Dr. Irene LimboGoodrich called and said she could take the plain Mucinex twice a day, 600mg ..  I called the son and relayed the message.

## 2016-05-15 ENCOUNTER — Encounter: Payer: Self-pay | Admitting: Family Medicine

## 2016-05-23 ENCOUNTER — Inpatient Hospital Stay (HOSPITAL_COMMUNITY)
Admission: EM | Admit: 2016-05-23 | Discharge: 2016-06-03 | DRG: 286 | Disposition: A | Discharge status: Home / Self Care | Payer: Commercial Managed Care - HMO | Attending: Internal Medicine | Admitting: Internal Medicine

## 2016-05-23 ENCOUNTER — Emergency Department (HOSPITAL_COMMUNITY): Payer: Commercial Managed Care - HMO

## 2016-05-23 ENCOUNTER — Encounter (HOSPITAL_COMMUNITY): Payer: Self-pay | Admitting: Emergency Medicine

## 2016-05-23 DIAGNOSIS — Z8673 Personal history of transient ischemic attack (TIA), and cerebral infarction without residual deficits: Secondary | ICD-10-CM

## 2016-05-23 DIAGNOSIS — J9601 Acute respiratory failure with hypoxia: Secondary | ICD-10-CM | POA: Diagnosis present

## 2016-05-23 DIAGNOSIS — N281 Cyst of kidney, acquired: Secondary | ICD-10-CM | POA: Diagnosis present

## 2016-05-23 DIAGNOSIS — Z79899 Other long term (current) drug therapy: Secondary | ICD-10-CM

## 2016-05-23 DIAGNOSIS — J9690 Respiratory failure, unspecified, unspecified whether with hypoxia or hypercapnia: Secondary | ICD-10-CM

## 2016-05-23 DIAGNOSIS — R0601 Orthopnea: Secondary | ICD-10-CM | POA: Diagnosis present

## 2016-05-23 DIAGNOSIS — Z7982 Long term (current) use of aspirin: Secondary | ICD-10-CM

## 2016-05-23 DIAGNOSIS — I130 Hypertensive heart and chronic kidney disease with heart failure and stage 1 through stage 4 chronic kidney disease, or unspecified chronic kidney disease: Secondary | ICD-10-CM | POA: Diagnosis present

## 2016-05-23 DIAGNOSIS — K661 Hemoperitoneum: Secondary | ICD-10-CM | POA: Diagnosis not present

## 2016-05-23 DIAGNOSIS — I714 Abdominal aortic aneurysm, without rupture: Secondary | ICD-10-CM | POA: Diagnosis present

## 2016-05-23 DIAGNOSIS — I5033 Acute on chronic diastolic (congestive) heart failure: Secondary | ICD-10-CM | POA: Diagnosis present

## 2016-05-23 DIAGNOSIS — L8990 Pressure ulcer of unspecified site, unspecified stage: Secondary | ICD-10-CM | POA: Diagnosis present

## 2016-05-23 DIAGNOSIS — J441 Chronic obstructive pulmonary disease with (acute) exacerbation: Secondary | ICD-10-CM | POA: Diagnosis not present

## 2016-05-23 DIAGNOSIS — J9600 Acute respiratory failure, unspecified whether with hypoxia or hypercapnia: Secondary | ICD-10-CM | POA: Diagnosis present

## 2016-05-23 DIAGNOSIS — Y95 Nosocomial condition: Secondary | ICD-10-CM | POA: Diagnosis not present

## 2016-05-23 DIAGNOSIS — I10 Essential (primary) hypertension: Secondary | ICD-10-CM | POA: Diagnosis not present

## 2016-05-23 DIAGNOSIS — N183 Chronic kidney disease, stage 3 (moderate): Secondary | ICD-10-CM | POA: Diagnosis present

## 2016-05-23 DIAGNOSIS — R911 Solitary pulmonary nodule: Secondary | ICD-10-CM | POA: Diagnosis present

## 2016-05-23 DIAGNOSIS — J189 Pneumonia, unspecified organism: Secondary | ICD-10-CM | POA: Diagnosis not present

## 2016-05-23 DIAGNOSIS — E7800 Pure hypercholesterolemia, unspecified: Secondary | ICD-10-CM | POA: Diagnosis present

## 2016-05-23 DIAGNOSIS — N179 Acute kidney failure, unspecified: Secondary | ICD-10-CM

## 2016-05-23 DIAGNOSIS — Z87891 Personal history of nicotine dependence: Secondary | ICD-10-CM

## 2016-05-23 DIAGNOSIS — D62 Acute posthemorrhagic anemia: Secondary | ICD-10-CM | POA: Diagnosis not present

## 2016-05-23 DIAGNOSIS — I2510 Atherosclerotic heart disease of native coronary artery without angina pectoris: Secondary | ICD-10-CM | POA: Diagnosis present

## 2016-05-23 DIAGNOSIS — N184 Chronic kidney disease, stage 4 (severe): Secondary | ICD-10-CM | POA: Diagnosis present

## 2016-05-23 DIAGNOSIS — D649 Anemia, unspecified: Secondary | ICD-10-CM | POA: Diagnosis not present

## 2016-05-23 DIAGNOSIS — D6489 Other specified anemias: Secondary | ICD-10-CM | POA: Diagnosis not present

## 2016-05-23 DIAGNOSIS — R0602 Shortness of breath: Secondary | ICD-10-CM

## 2016-05-23 DIAGNOSIS — J449 Chronic obstructive pulmonary disease, unspecified: Secondary | ICD-10-CM | POA: Diagnosis present

## 2016-05-23 HISTORY — DX: Personal history of transient ischemic attack (TIA), and cerebral infarction without residual deficits: Z86.73

## 2016-05-23 HISTORY — DX: Solitary pulmonary nodule: R91.1

## 2016-05-23 HISTORY — DX: Chronic obstructive pulmonary disease, unspecified: J44.9

## 2016-05-23 HISTORY — DX: Hyperlipidemia, unspecified: E78.5

## 2016-05-23 HISTORY — DX: Chronic kidney disease, stage 3 (moderate): N18.3

## 2016-05-23 HISTORY — DX: Chronic kidney disease, stage 3 unspecified: N18.30

## 2016-05-23 HISTORY — DX: Essential (primary) hypertension: I10

## 2016-05-23 LAB — CBC WITH DIFFERENTIAL/PLATELET
BASOS PCT: 0 %
Basophils Absolute: 0 10*3/uL (ref 0.0–0.1)
EOS ABS: 0.3 10*3/uL (ref 0.0–0.7)
Eosinophils Relative: 2 %
HCT: 33.3 % — ABNORMAL LOW (ref 36.0–46.0)
Hemoglobin: 10.5 g/dL — ABNORMAL LOW (ref 12.0–15.0)
LYMPHS ABS: 1.5 10*3/uL (ref 0.7–4.0)
Lymphocytes Relative: 8 %
MCH: 27.7 pg (ref 26.0–34.0)
MCHC: 31.5 g/dL (ref 30.0–36.0)
MCV: 87.9 fL (ref 78.0–100.0)
Monocytes Absolute: 1.2 10*3/uL — ABNORMAL HIGH (ref 0.1–1.0)
Monocytes Relative: 7 %
Neutro Abs: 15.6 10*3/uL — ABNORMAL HIGH (ref 1.7–7.7)
Neutrophils Relative %: 83 %
Platelets: 186 10*3/uL (ref 150–400)
RBC: 3.79 MIL/uL — AB (ref 3.87–5.11)
RDW: 17.1 % — ABNORMAL HIGH (ref 11.5–15.5)
WBC: 18.8 10*3/uL — AB (ref 4.0–10.5)

## 2016-05-23 LAB — BLOOD GAS, ARTERIAL
Acid-base deficit: 0.4 mmol/L (ref 0.0–2.0)
Bicarbonate: 24.1 mEq/L — ABNORMAL HIGH (ref 20.0–24.0)
Drawn by: 23534
O2 Content: 2 L/min
O2 SAT: 94.2 %
PCO2 ART: 38 mmHg (ref 35.0–45.0)
pH, Arterial: 7.41 (ref 7.350–7.450)
pO2, Arterial: 70.8 mmHg — ABNORMAL LOW (ref 80.0–100.0)

## 2016-05-23 LAB — TROPONIN I
TROPONIN I: 0.03 ng/mL — AB (ref <0.03)
Troponin I: 0.03 ng/mL (ref <0.03)
Troponin I: 0.03 ng/mL (ref <0.03)

## 2016-05-23 LAB — BASIC METABOLIC PANEL
Anion gap: 8 (ref 5–15)
BUN: 27 mg/dL — AB (ref 6–20)
CHLORIDE: 109 mmol/L (ref 101–111)
CO2: 23 mmol/L (ref 22–32)
CREATININE: 1.34 mg/dL — AB (ref 0.44–1.00)
Calcium: 8.7 mg/dL — ABNORMAL LOW (ref 8.9–10.3)
GFR calc non Af Amer: 37 mL/min — ABNORMAL LOW (ref >60)
GFR, EST AFRICAN AMERICAN: 42 mL/min — AB (ref >60)
Glucose, Bld: 127 mg/dL — ABNORMAL HIGH (ref 65–99)
Potassium: 4 mmol/L (ref 3.5–5.1)
Sodium: 140 mmol/L (ref 135–145)

## 2016-05-23 LAB — MRSA PCR SCREENING: MRSA BY PCR: NEGATIVE

## 2016-05-23 LAB — PROTIME-INR
INR: 0.98 (ref 0.00–1.49)
PROTHROMBIN TIME: 13.2 s (ref 11.6–15.2)

## 2016-05-23 LAB — BRAIN NATRIURETIC PEPTIDE: B NATRIURETIC PEPTIDE 5: 1216 pg/mL — AB (ref 0.0–100.0)

## 2016-05-23 MED ORDER — ENOXAPARIN SODIUM 30 MG/0.3ML ~~LOC~~ SOLN
30.0000 mg | SUBCUTANEOUS | Status: DC
  Administered 2016-05-23 – 2016-05-27 (×5): 30 mg via SUBCUTANEOUS
  Filled 2016-05-23 (×5): qty 0.3

## 2016-05-23 MED ORDER — IPRATROPIUM-ALBUTEROL 0.5-2.5 (3) MG/3ML IN SOLN
3.0000 mL | Freq: Once | RESPIRATORY_TRACT | Status: AC
  Administered 2016-05-23: 3 mL via RESPIRATORY_TRACT
  Filled 2016-05-23: qty 3

## 2016-05-23 MED ORDER — METHYLPREDNISOLONE SODIUM SUCC 125 MG IJ SOLR
125.0000 mg | Freq: Once | INTRAMUSCULAR | Status: AC
  Administered 2016-05-23: 125 mg via INTRAVENOUS
  Filled 2016-05-23: qty 2

## 2016-05-23 MED ORDER — SODIUM CHLORIDE 0.9% FLUSH
3.0000 mL | Freq: Two times a day (BID) | INTRAVENOUS | Status: DC
  Administered 2016-05-24 – 2016-05-27 (×6): 3 mL via INTRAVENOUS

## 2016-05-23 MED ORDER — NITROGLYCERIN IN D5W 200-5 MCG/ML-% IV SOLN
5.0000 ug/min | Freq: Once | INTRAVENOUS | Status: AC
  Administered 2016-05-23: 5 ug/min via INTRAVENOUS
  Filled 2016-05-23: qty 250

## 2016-05-23 MED ORDER — ACETAMINOPHEN 325 MG PO TABS: 650.0000 mg | ORAL_TABLET | ORAL | Status: DC | PRN

## 2016-05-23 MED ORDER — CETYLPYRIDINIUM CHLORIDE 0.05 % MT LIQD
7.0000 mL | Freq: Two times a day (BID) | OROMUCOSAL | Status: DC
  Administered 2016-05-24 – 2016-06-01 (×13): 7 mL via OROMUCOSAL

## 2016-05-23 MED ORDER — AMLODIPINE BESYLATE 10 MG PO TABS
10.0000 mg | ORAL_TABLET | Freq: Every day | ORAL | Status: DC
  Administered 2016-05-23 – 2016-06-03 (×12): 10 mg via ORAL
  Filled 2016-05-23 (×5): qty 1
  Filled 2016-05-23 (×2): qty 2
  Filled 2016-05-23 (×5): qty 1

## 2016-05-23 MED ORDER — SODIUM CHLORIDE 0.9 % IV SOLN: 250.0000 mL | INTRAVENOUS | Status: DC | PRN

## 2016-05-23 MED ORDER — ALLOPURINOL 100 MG PO TABS
100.0000 mg | ORAL_TABLET | Freq: Every day | ORAL | Status: DC
Start: 2016-05-23 — End: 2016-06-03
  Administered 2016-05-24 – 2016-06-02 (×8): 100 mg via ORAL
  Filled 2016-05-23 (×10): qty 1

## 2016-05-23 MED ORDER — NITROGLYCERIN IN D5W 200-5 MCG/ML-% IV SOLN: 0.0000 ug/min | INTRAVENOUS | Status: DC

## 2016-05-23 MED ORDER — SODIUM CHLORIDE 0.9% FLUSH: 3.0000 mL | INTRAVENOUS | Status: DC | PRN

## 2016-05-23 MED ORDER — ASPIRIN 81 MG PO CHEW
324.0000 mg | CHEWABLE_TABLET | Freq: Once | ORAL | Status: AC
Start: 2016-05-23 — End: 2016-05-23
  Administered 2016-05-23: 324 mg via ORAL
  Filled 2016-05-23: qty 4

## 2016-05-23 MED ORDER — FUROSEMIDE 10 MG/ML IJ SOLN
60.0000 mg | Freq: Two times a day (BID) | INTRAMUSCULAR | Status: DC
  Administered 2016-05-23 – 2016-05-24 (×2): 60 mg via INTRAVENOUS
  Filled 2016-05-23 (×2): qty 6

## 2016-05-23 MED ORDER — IPRATROPIUM-ALBUTEROL 0.5-2.5 (3) MG/3ML IN SOLN
3.0000 mL | Freq: Four times a day (QID) | RESPIRATORY_TRACT | Status: DC
  Administered 2016-05-23 – 2016-05-24 (×2): 3 mL via RESPIRATORY_TRACT
  Filled 2016-05-23 (×2): qty 3

## 2016-05-23 MED ORDER — ASPIRIN EC 81 MG PO TBEC
81.0000 mg | DELAYED_RELEASE_TABLET | Freq: Every day | ORAL | Status: DC
  Administered 2016-05-23 – 2016-05-27 (×4): 81 mg via ORAL
  Filled 2016-05-23 (×5): qty 1

## 2016-05-23 MED ORDER — ONDANSETRON HCL 4 MG/2ML IJ SOLN
4.0000 mg | Freq: Four times a day (QID) | INTRAMUSCULAR | Status: DC | PRN
  Administered 2016-05-26: 4 mg via INTRAVENOUS
  Filled 2016-05-23: qty 2

## 2016-05-23 MED ORDER — METOPROLOL TARTRATE 25 MG PO TABS
25.0000 mg | ORAL_TABLET | Freq: Two times a day (BID) | ORAL | Status: DC
  Administered 2016-05-23 – 2016-05-27 (×8): 25 mg via ORAL
  Filled 2016-05-23 (×8): qty 1

## 2016-05-23 MED ORDER — GUAIFENESIN ER 600 MG PO TB12
1200.0000 mg | ORAL_TABLET | Freq: Two times a day (BID) | ORAL | Status: DC
  Administered 2016-05-23 – 2016-05-27 (×8): 1200 mg via ORAL
  Filled 2016-05-23 (×8): qty 2

## 2016-05-23 MED ORDER — LORATADINE 10 MG PO TABS
10.0000 mg | ORAL_TABLET | Freq: Every day | ORAL | Status: DC
Start: 2016-05-24 — End: 2016-06-03
  Administered 2016-05-24 – 2016-06-03 (×11): 10 mg via ORAL
  Filled 2016-05-23 (×11): qty 1

## 2016-05-23 MED ORDER — FUROSEMIDE 10 MG/ML IJ SOLN
40.0000 mg | Freq: Once | INTRAMUSCULAR | Status: AC
  Administered 2016-05-23: 40 mg via INTRAVENOUS
  Filled 2016-05-23: qty 4

## 2016-05-23 MED ORDER — CHLORHEXIDINE GLUCONATE 0.12 % MT SOLN
15.0000 mL | Freq: Two times a day (BID) | OROMUCOSAL | Status: DC
  Administered 2016-05-23 – 2016-06-03 (×17): 15 mL via OROMUCOSAL
  Filled 2016-05-23 (×19): qty 15

## 2016-05-23 NOTE — Progress Notes (Signed)
Pt transported on BIPAP from ED10 to ICU01 without incidence. RT will continue to monitor.

## 2016-05-23 NOTE — H&P (Addendum)
History and Physical    Sydney Jacobs ZOX:[id number] DOB: 1934-01-08 DOA: 05/23/2016  PCP: Colette Ribas, MD  Patient coming from: home  Chief Complaint: shortness of breath  HPI: Sydney Jacobs is a 82 y.o. female with medical history significant of diastolic chf, CKD 3, and COPD. Patient was recently discharged from the hospital on 7/7 after being treated for shortness of breath which was felt to be related to a combination of COPD and CHF. She has undergone diuresis during that hospitalization and had developed renal failure. On discharge, she was sent home on a prednisone taper, bronchodilators and beta blockers. It does not appear that diuretics were included in her discharge meds, likely due to recovering renal function. She says that since her discharge, she has been noticing worsening lower extremity edema. She has had significant orthopnea and has been unable to sleep flat. She has some central chest pain, which only occurs while coughing. This morning, she woke up to worsening shortness of breath, wheezing and came to the ED for evaluation.   ED Course: in the ED she was noted to be in respiratory distress. Despite wearing oxygen, she continued to be tachypneic. She was started on bipap with some improvement in her shortness of breath. CXR showed evidence of pleural effusions, BNP noted to be higher than last admission. She was given a dose of lasix, solumedrol,nebs and referred for admission  Review of Systems: As per HPI otherwise 10 point review of systems negative.    Past Medical History  Diagnosis Date  . Hypertension   . High cholesterol   . Stroke (HCC)   . Chronic diastolic CHF (congestive heart failure) (HCC)   . CKD (chronic kidney disease)     Past Surgical History  Procedure Laterality Date  . Partial hip arthroplasty    . Appendectomy    . Tonsillectomy    . Abdominal hysterectomy    . Hemorrhoid surgery       reports that she has quit smoking. She  does not have any smokeless tobacco history on file. She reports that she does not drink alcohol or use illicit drugs.  Allergies  Allergen Reactions  . Sulfa Antibiotics Other (See Comments)    unknown    Family History  Problem Relation Age of Onset  . Hypertension Father   . Hypertension Mother      Prior to Admission medications   Medication Sig Start Date End Date Taking? Authorizing Provider  albuterol (PROVENTIL HFA;VENTOLIN HFA) 108 (90 Base) MCG/ACT inhaler Inhale 2 puffs into the lungs every 6 (six) hours as needed for wheezing or shortness of breath. Pharmacy please instruct in proper techinique 05/13/16  Yes Standley Brooking, MD  allopurinol (ZYLOPRIM) 100 MG tablet Take 1 tablet by mouth at bedtime.  03/23/16  Yes Historical Provider, MD  amLODipine (NORVASC) 10 MG tablet Take 1 tablet (10 mg total) by mouth daily. 05/13/16  Yes Standley Brooking, MD  aspirin EC 81 MG tablet Take 81 mg by mouth daily.   Yes Historical Provider, MD  cetirizine (ZYRTEC) 10 MG tablet Take 10 mg by mouth daily.   Yes Historical Provider, MD  metoprolol tartrate (LOPRESSOR) 25 MG tablet Take 1 tablet (25 mg total) by mouth 2 (two) times daily. 05/13/16  Yes Standley Brooking, MD  Vitamin D, Ergocalciferol, (DRISDOL) 50000 units CAPS capsule Take 1 capsule by mouth once a week. Takes on Fridays. 03/23/16  Yes Historical Provider, MD    Physical Exam:  Filed Vitals:   05/23/16 1500 05/23/16 1530 05/23/16 1600 05/23/16 1700  BP: 178/83 187/83 168/67   Pulse: 84 82 79   Temp:    98.2 F (36.8 C)  TempSrc:    Axillary  Resp: 15 16 11    Height:      Weight:      SpO2: 97% 97% 97%       Constitutional: NAD, calm, comfortable, bipap in place Filed Vitals:   05/23/16 1500 05/23/16 1530 05/23/16 1600 05/23/16 1700  BP: 178/83 187/83 168/67   Pulse: 84 82 79   Temp:    98.2 F (36.8 C)  TempSrc:    Axillary  Resp: 15 16 11    Height:      Weight:      SpO2: 97% 97% 97%    Eyes: PERRL, lids  and conjunctivae normal ENMT: limited due to bipap mask over face  Neck: normal, supple, no masses, no thyromegaly Respiratory:minimal crackles at bases, no wheezing. Mild increase in respiratory effort. No accessory muscle use.  Cardiovascular: Regular rate and rhythm, no murmurs / rubs / gallops. 1-2+ extremity edema. 2+ pedal pulses. No carotid bruits.  Abdomen: no tenderness, no masses palpated. No hepatosplenomegaly. Bowel sounds positive.  Musculoskeletal: no clubbing / cyanosis. No joint deformity upper and lower extremities. Good ROM, no contractures. Normal muscle tone.  Skin: no rashes, lesions, ulcers. No induration Neurologic: CN 2-12 grossly intact. Sensation intact, DTR normal. Strength 5/5 in all 4.  Psychiatric: Normal judgment and insight. Alert and oriented x 3. Normal mood.   Labs on Admission: I have personally reviewed following labs and imaging studies  CBC:  Recent Labs Lab 05/23/16 1330  WBC 18.8*  NEUTROABS 15.6*  HGB 10.5*  HCT 33.3*  MCV 87.9  PLT 186   Basic Metabolic Panel:  Recent Labs Lab 05/23/16 1330  NA 140  K 4.0  CL 109  CO2 23  GLUCOSE 127*  BUN 27*  CREATININE 1.34*  CALCIUM 8.7*   GFR: Estimated Creatinine Clearance: 25.7 mL/min (by C-G formula based on Cr of 1.34). Liver Function Tests: No results for input(s): AST, ALT, ALKPHOS, BILITOT, PROT, ALBUMIN in the last 168 hours. No results for input(s): LIPASE, AMYLASE in the last 168 hours. No results for input(s): AMMONIA in the last 168 hours. Coagulation Profile:  Recent Labs Lab 05/23/16 1330  INR 0.98   Cardiac Enzymes:  Recent Labs Lab 05/23/16 1330  TROPONINI 0.03*   BNP (last 3 results) No results for input(s): PROBNP in the last 8760 hours. HbA1C: No results for input(s): HGBA1C in the last 72 hours. CBG: No results for input(s): GLUCAP in the last 168 hours. Lipid Profile: No results for input(s): CHOL, HDL, LDLCALC, TRIG, CHOLHDL, LDLDIRECT in the last  72 hours. Thyroid Function Tests: No results for input(s): TSH, T4TOTAL, FREET4, T3FREE, THYROIDAB in the last 72 hours. Anemia Panel: No results for input(s): VITAMINB12, FOLATE, FERRITIN, TIBC, IRON, RETICCTPCT in the last 72 hours. Urine analysis:    Component Value Date/Time   COLORURINE YELLOW 05/11/2016 2210   APPEARANCEUR CLEAR 05/11/2016 2210   LABSPEC 1.015 05/11/2016 2210   PHURINE 5.0 05/11/2016 2210   GLUCOSEU NEGATIVE 05/11/2016 2210   HGBUR NEGATIVE 05/11/2016 2210   BILIRUBINUR NEGATIVE 05/11/2016 2210   KETONESUR NEGATIVE 05/11/2016 2210   PROTEINUR 30* 05/11/2016 2210   UROBILINOGEN 0.2 04/22/2010 1413   NITRITE NEGATIVE 05/11/2016 2210   LEUKOCYTESUR TRACE* 05/11/2016 2210   Sepsis Labs: !!!!!!!!!!!!!!!!!!!!!!!!!!!!!!!!!!!!!!!!!!!! @LABRCNTIP (procalcitonin:4,lacticidven:4) )No results found for  this or any previous visit (from the past 240 hour(s)).   Radiological Exams on Admission: Dg Chest Portable 1 View  05/23/2016  CLINICAL DATA:  Shortness of breath beginning this morning. EXAM: PORTABLE CHEST 1 VIEW COMPARISON:  Single-view of the chest 05/12/2016 and 05/08/2016. CT chest 05/08/2016. FINDINGS: The lungs are emphysematous. Since the most recent examination, right basilar airspace disease has worsened. The patient has a new small left pleural effusion with increased left basilar airspace disease. Small right effusion is noted. No pneumothorax. Heart size is normal. 1 cm left upper lobe nodule seen on the prior chest CT is not visible on this examination. Aortic atherosclerosis is noted. No focal bony abnormality. IMPRESSION: Increased bibasilar airspace disease and small effusions worrisome for pneumonia and/or pulmonary edema. Emphysema. Atherosclerosis. Electronically Signed   By: Drusilla Kanner M.D.   On: 05/23/2016 13:50    EKG: Independently reviewed. Sinus rhythm, no acute changes  Assessment/Plan Active Problems:   CKD (chronic kidney disease) stage  3, GFR 30-59 ml/min   Essential hypertension   Acute respiratory failure with hypoxia (HCC)   Acute on chronic diastolic CHF (congestive heart failure) (HCC)   COPD (chronic obstructive pulmonary disease) (HCC)    1. Acute respiratory failure with hypoxia. Likely related to CHF exacerbation. Currently on bipap, will try and wean off as tolerated. Consult pulmonology 2. Acute on chronic diastolic CHF. Echo done on last admission with normal EF. Will start the patient on IV lasix. Continue BB. No ACE due to renal dysfunction. Continue nitroglycerin infusion started in ED. Will consult cardiology since patient has been readmitted with CHF 3. COPD. No further evidence of wheezing. Will continue bronchodilators and pulmonary hygiene 4. Hypertension. Continue on lopressor.  5. CKD stage 3. Creatinine has improved since last admission. Continue to follow in the setting of diuresis. 6. Leukocytosis. Possibly related to recent steroid taper vs. Stress demargination. No fever. Continue to monitor.    DVT prophylaxis: lovenox Code Status: full code Family Communication: no family present Disposition Plan: discharge home once improved Consults called: pulmonology, cardiology Admission status: inpatient, SDU   Jaziya Obarr MD [facility] Pager [phone]  If 7PM-7AM, please contact night-coverage [email or web address] Password [email or web address]  05/23/2016, 5:14 PM

## 2016-05-23 NOTE — ED Notes (Signed)
MD at bedside. 

## 2016-05-23 NOTE — ED Notes (Signed)
Patient complaining of shortness of breath starting at 0130 this morning. Also complaining of chest pain.

## 2016-05-23 NOTE — ED Provider Notes (Signed)
CSN: [id number]     Arrival date & time 05/23/16  1314 History  By signing my name below, I, Rosario Adie, attest that this documentation has been prepared under the direction and in the presence of Glynn Octave, MD. Electronically Signed: Rosario Adie, ED Scribe. 05/23/2016. 1:57 PM.   Chief Complaint  Patient presents with  . Shortness of Breath  . Chest Pain   The history is provided by the patient. No language interpreter was used.   HPI Comments: PEG FIFER is a 82 y.o. female with a PMHx of HTN, CKD, stroke, and CHF who presents to the Emergency Department complaining of sudden onset, gradually worsening, constant SOB onset this morning PTA.  She has associated bilateral leg swelling x 2 weeks, which her husband reports began while she was hospitalized. She also states that she has had a dry cough and moderate chest pain secondary to her cough. Pt was seen in the ED on 05/13/16 for a similar episode of SOB and was admitted at that time. She was discharged on 05/13/16. Before her discharge a CT Chest was performed which was resulted with chronic emphysema and a spot on her lung consistent with lung cancer. Pt does not wear O2 at home, and notes that she has to sleep sitting up. Pt reports that she was prescribed Lasix for her CHF, but has not been taking because she states that they "dehydrate me". She does not smoke currently, but she is a former smoker. Pt is not complaining of any other symptoms.   Past Medical History  Diagnosis Date  . Hypertension   . High cholesterol   . Stroke (HCC)   . Chronic diastolic CHF (congestive heart failure) (HCC)   . CKD (chronic kidney disease)    Past Surgical History  Procedure Laterality Date  . Partial hip arthroplasty    . Appendectomy    . Tonsillectomy    . Abdominal hysterectomy    . Hemorrhoid surgery     Family History  Problem Relation Age of Onset  . Hypertension Father   . Hypertension Mother    Social  History  Substance Use Topics  . Smoking status: Former Games developer  . Smokeless tobacco: None  . Alcohol Use: No   OB History    No data available     Review of Systems A complete 10 system review of systems was obtained and all systems are negative except as noted in the HPI and PMH.   Allergies  Sulfa antibiotics  Home Medications   Prior to Admission medications   Medication Sig Start Date End Date Taking? Authorizing Provider  albuterol (PROVENTIL HFA;VENTOLIN HFA) 108 (90 Base) MCG/ACT inhaler Inhale 2 puffs into the lungs every 6 (six) hours as needed for wheezing or shortness of breath. Pharmacy please instruct in proper techinique 05/13/16   Standley Brooking, MD  allopurinol (ZYLOPRIM) 100 MG tablet Take 1 tablet by mouth daily. 03/23/16   Historical Provider, MD  amLODipine (NORVASC) 10 MG tablet Take 1 tablet (10 mg total) by mouth daily. 05/13/16   Standley Brooking, MD  aspirin EC 81 MG tablet Take 81 mg by mouth daily.    Historical Provider, MD  cetirizine (ZYRTEC) 10 MG tablet Take 10 mg by mouth daily.    Historical Provider, MD  metoprolol tartrate (LOPRESSOR) 25 MG tablet Take 1 tablet (25 mg total) by mouth 2 (two) times daily. 05/13/16   Standley Brooking, MD  predniSONE (DELTASONE) 10 MG  tablet Take daily by mouth: 40 mg x3 days, then 20 mg x3 days, then 10 mg x3 days, then stop. 05/13/16   Standley Brookinganiel P Goodrich, MD  Vitamin D, Ergocalciferol, (DRISDOL) 50000 units CAPS capsule Take 1 capsule by mouth once a week. Takes on Fridays. 03/23/16   Historical Provider, MD   BP 179/72 mmHg  Pulse 96  Temp(Src) 97.9 F (36.6 C) (Oral)  Resp 36  Ht 5\' 1"  (1.549 m)  Wt 123 lb (55.792 kg)  BMI 23.25 kg/m2  SpO2 87%   Physical Exam  Constitutional: She is oriented to person, place, and time. She appears well-developed and well-nourished. She appears distressed.  She is speaking in short, 1-2 word phrases.  HENT:  Head: Normocephalic and atraumatic.  Eyes: Conjunctivae are normal.   Neck: Normal range of motion.  Cardiovascular: Normal rate, regular rhythm and normal heart sounds.   Pulmonary/Chest: She is in respiratory distress. She has wheezes.  Decreased breath sounds in the bases with scattered expiratory wheezing.  Abdominal: She exhibits no distension.  Musculoskeletal: She exhibits edema.  Pt has +2 edema to the bilateral knees  Neurological: She is alert and oriented to person, place, and time.  Skin: Skin is warm and dry. There is pallor.  Pt is overall pale appearing.  Psychiatric: Judgment normal.  Nursing note and vitals reviewed.  ED Course  Procedures (including critical care time)  DIAGNOSTIC STUDIES: Oxygen Saturation is 87% on 2L/min O2, low by my interpretation.   COORDINATION OF CARE: 1:37 PM-Discussed next steps with pt including DG Chest, CBC, BMP, Troponin, Bipap, BNP, and EKG. Pt verbalized understanding and is agreeable with the plan.   Labs Review Labs Reviewed  CBC WITH DIFFERENTIAL/PLATELET - Abnormal; Notable for the following:    WBC 18.8 (*)    RBC 3.79 (*)    Hemoglobin 10.5 (*)    HCT 33.3 (*)    RDW 17.1 (*)    Neutro Abs 15.6 (*)    Monocytes Absolute 1.2 (*)    All other components within normal limits  BASIC METABOLIC PANEL - Abnormal; Notable for the following:    Glucose, Bld 127 (*)    BUN 27 (*)    Creatinine, Ser 1.34 (*)    Calcium 8.7 (*)    GFR calc non Af Amer 37 (*)    GFR calc Af Amer 42 (*)    All other components within normal limits  TROPONIN I - Abnormal; Notable for the following:    Troponin I 0.03 (*)    All other components within normal limits  BRAIN NATRIURETIC PEPTIDE - Abnormal; Notable for the following:    B Natriuretic Peptide 1216.0 (*)    All other components within normal limits  BLOOD GAS, ARTERIAL - Abnormal; Notable for the following:    pO2, Arterial 70.8 (*)    Bicarbonate 24.1 (*)    All other components within normal limits  TROPONIN I - Abnormal; Notable for the  following:    Troponin I 0.03 (*)    All other components within normal limits  MRSA PCR SCREENING  PROTIME-INR  BASIC METABOLIC PANEL  TROPONIN I  TROPONIN I    Imaging Review Dg Chest Portable 1 View  05/23/2016  CLINICAL DATA:  Shortness of breath beginning this morning. EXAM: PORTABLE CHEST 1 VIEW COMPARISON:  Single-view of the chest 05/12/2016 and 05/08/2016. CT chest 05/08/2016. FINDINGS: The lungs are emphysematous. Since the most recent examination, right basilar airspace disease has worsened. The patient  has a new small left pleural effusion with increased left basilar airspace disease. Small right effusion is noted. No pneumothorax. Heart size is normal. 1 cm left upper lobe nodule seen on the prior chest CT is not visible on this examination. Aortic atherosclerosis is noted. No focal bony abnormality. IMPRESSION: Increased bibasilar airspace disease and small effusions worrisome for pneumonia and/or pulmonary edema. Emphysema. Atherosclerosis. Electronically Signed   By: Drusilla Kanner M.D.   On: 05/23/2016 13:50    I have personally reviewed and evaluated these images and lab results as part of my medical decision-making.   EKG Interpretation   Date/Time:  Monday May 23 2016 13:28:00 EDT Ventricular Rate:  94 PR Interval:    QRS Duration: 110 QT Interval:  381 QTC Calculation: 477 R Axis:   1 Text Interpretation:  Sinus rhythm Anterior infarct, old No significant  change was found Confirmed by Manus Gunning  MD, Rodderick Holtzer [phone]) on 05/23/2016  2:05:47 PM      MDM   Final diagnoses:  Acute respiratory failure with hypoxia (HCC)  Acute on chronic diastolic congestive heart failure (HCC)  COPD exacerbation (HCC)   Patient from home with respiratory distress and difficulty breathing over the past one day. Recent hospitalization for same. Denies having history of emphysema or CHF though this is documented during her previous admission. Echocardiogram normal EF does not take  diuretics at home. New nodule on recent CT scan concerning for lung cancer. Patient with crackles and wheezing throughout in lower extremity edema. Placed on BiPAP. IV Lasix given IV nitroglycerin started  Tolerating bipap well. No CO2 retention on ABG. Edema on CXR. IV lasix, IV NTG gtt.  Admission to stepdown d/w Dr. Kerry Hough.  CRITICAL CARE Performed by: Glynn Octave Total critical care time: 40 minutes Critical care time was exclusive of separately billable procedures and treating other patients. Critical care was necessary to treat or prevent imminent or life-threatening deterioration. Critical care was time spent personally by me on the following activities: development of treatment plan with patient and/or surrogate as well as nursing, discussions with consultants, evaluation of patient's response to treatment, examination of patient, obtaining history from patient or surrogate, ordering and performing treatments and interventions, ordering and review of laboratory studies, ordering and review of radiographic studies, pulse oximetry and re-evaluation of patient's condition.      I personally performed the services described in this documentation, which was scribed in my presence. The recorded information has been reviewed and is accurate.     Glynn Octave, MD 05/23/16 [phone]

## 2016-05-23 NOTE — Progress Notes (Signed)
Pt refuses foley catheter despite being educated on its benefits.

## 2016-05-23 NOTE — ED Notes (Signed)
[facility] Lajean Saverancor made aware. Troponin 0.03

## 2016-05-23 NOTE — Progress Notes (Signed)
Pt and family were unaware of CHF diagnosis.  Education regarding daily weights given to pt and family.  Will need reinforcing as pt's condition improves.

## 2016-05-24 ENCOUNTER — Encounter (HOSPITAL_COMMUNITY): Payer: Self-pay | Admitting: Cardiology

## 2016-05-24 ENCOUNTER — Inpatient Hospital Stay (HOSPITAL_COMMUNITY): Payer: Commercial Managed Care - HMO

## 2016-05-24 DIAGNOSIS — N183 Chronic kidney disease, stage 3 (moderate): Secondary | ICD-10-CM

## 2016-05-24 DIAGNOSIS — J9601 Acute respiratory failure with hypoxia: Secondary | ICD-10-CM

## 2016-05-24 DIAGNOSIS — R911 Solitary pulmonary nodule: Secondary | ICD-10-CM

## 2016-05-24 DIAGNOSIS — I10 Essential (primary) hypertension: Secondary | ICD-10-CM

## 2016-05-24 DIAGNOSIS — J449 Chronic obstructive pulmonary disease, unspecified: Secondary | ICD-10-CM

## 2016-05-24 DIAGNOSIS — I200 Unstable angina: Secondary | ICD-10-CM

## 2016-05-24 DIAGNOSIS — I25709 Atherosclerosis of coronary artery bypass graft(s), unspecified, with unspecified angina pectoris: Secondary | ICD-10-CM

## 2016-05-24 DIAGNOSIS — I5033 Acute on chronic diastolic (congestive) heart failure: Secondary | ICD-10-CM

## 2016-05-24 LAB — BASIC METABOLIC PANEL
Anion gap: 10 (ref 5–15)
BUN: 35 mg/dL — ABNORMAL HIGH (ref 6–20)
CALCIUM: 8.4 mg/dL — AB (ref 8.9–10.3)
CHLORIDE: 105 mmol/L (ref 101–111)
CO2: 26 mmol/L (ref 22–32)
CREATININE: 1.4 mg/dL — AB (ref 0.44–1.00)
GFR calc Af Amer: 40 mL/min — ABNORMAL LOW (ref >60)
GFR calc non Af Amer: 35 mL/min — ABNORMAL LOW (ref >60)
GLUCOSE: 142 mg/dL — AB (ref 65–99)
Potassium: 4.4 mmol/L (ref 3.5–5.1)
Sodium: 141 mmol/L (ref 135–145)

## 2016-05-24 LAB — TROPONIN I: Troponin I: 0.03 ng/mL (ref <0.03)

## 2016-05-24 MED ORDER — METHYLPREDNISOLONE SODIUM SUCC 40 MG IJ SOLR
40.0000 mg | Freq: Two times a day (BID) | INTRAMUSCULAR | Status: DC
  Administered 2016-05-24 – 2016-05-25 (×3): 40 mg via INTRAVENOUS
  Filled 2016-05-24 (×3): qty 1

## 2016-05-24 MED ORDER — ATORVASTATIN CALCIUM 80 MG PO TABS
80.0000 mg | ORAL_TABLET | Freq: Every day | ORAL | Status: DC
  Administered 2016-05-24 – 2016-06-02 (×10): 80 mg via ORAL
  Filled 2016-05-24 (×9): qty 1
  Filled 2016-05-24: qty 2

## 2016-05-24 MED ORDER — VANCOMYCIN HCL 500 MG IV SOLR
500.0000 mg | INTRAVENOUS | Status: DC
  Administered 2016-05-25: 500 mg via INTRAVENOUS
  Filled 2016-05-24 (×3): qty 500

## 2016-05-24 MED ORDER — HYDRALAZINE HCL 25 MG PO TABS
25.0000 mg | ORAL_TABLET | Freq: Three times a day (TID) | ORAL | Status: DC
Start: 2016-05-24 — End: 2016-05-27
  Administered 2016-05-24 – 2016-05-27 (×9): 25 mg via ORAL
  Filled 2016-05-24 (×9): qty 1

## 2016-05-24 MED ORDER — VANCOMYCIN HCL IN DEXTROSE 1-5 GM/200ML-% IV SOLN
1000.0000 mg | Freq: Once | INTRAVENOUS | Status: AC
  Administered 2016-05-24: 1000 mg via INTRAVENOUS
  Filled 2016-05-24: qty 200

## 2016-05-24 MED ORDER — IPRATROPIUM-ALBUTEROL 0.5-2.5 (3) MG/3ML IN SOLN
3.0000 mL | Freq: Three times a day (TID) | RESPIRATORY_TRACT | Status: DC
  Administered 2016-05-24 – 2016-05-25 (×5): 3 mL via RESPIRATORY_TRACT
  Filled 2016-05-24 (×5): qty 3

## 2016-05-24 MED ORDER — FUROSEMIDE 10 MG/ML IJ SOLN
60.0000 mg | Freq: Every day | INTRAMUSCULAR | Status: DC
  Administered 2016-05-25: 60 mg via INTRAVENOUS
  Filled 2016-05-24: qty 6

## 2016-05-24 MED ORDER — LIVING BETTER WITH HEART FAILURE BOOK: Freq: Once | Status: DC

## 2016-05-24 MED ORDER — PIPERACILLIN-TAZOBACTAM 3.375 G IVPB
3.3750 g | Freq: Three times a day (TID) | INTRAVENOUS | Status: AC
  Administered 2016-05-24 – 2016-05-26 (×8): 3.375 g via INTRAVENOUS
  Filled 2016-05-24 (×9): qty 50

## 2016-05-24 NOTE — Consult Note (Signed)
Requesting provider: Dr. Erick BlinksJehanzeb Memon Consulting cardiologist: Dr. Jonelle SidleSamuel G. Taresa [place]  Reason for consultation: Diastolic heart failure  Clinical Summary [name] is a 82 y.o.female with past medical history outlined below, recently hospitalized July 2 to July 7 with hypertensive urgency associated with suspected COPD exacerbation as well as acute diastolic heart failure. She was found to have a left upper lobe pulmonary nodule at that time with outpatient workup pending (PFTs and PET scan), also had acute on chronic renal insufficiency in the setting of diuresis with probable CKD stage 3 at baseline. Peak troponin I level was only 0.06 during prior hospital stay.  She is now readmitted to the hospital complaining of sudden onset shortness of breath that occurred around 1 AM, waking her from sleep. She was in respiratory distress and required transient use of BiPAP with findings of bibasilar airspace disease and pleural effusions. She improved significantly with BiPAP and diuresis of about 2500 cc. She states that she has had some intermittent sharp chest discomfort, but has also had some coughing. Pulmonary inhalers were not helpful with this presentation.  Echocardiogram from July 3 showed LVEF 55-60% without regional wall motion abnormalities, grade 1 diastolic dysfunction, increased LV filling pressures, sclerotic aortic valve without stenosis, mild mitral stenosis and regurgitation. Chest CT imaging incidentally noted atherosclerosis involving the aorta and also coronary arteries.   Allergies  Allergen Reactions  . Sulfa Antibiotics Other (See Comments)    unknown    Medications Scheduled Medications: . allopurinol  100 mg Oral QHS  . amLODipine  10 mg Oral Daily  . antiseptic oral rinse  7 mL Mouth Rinse q12n4p  . aspirin EC  81 mg Oral Daily  . chlorhexidine  15 mL Mouth Rinse BID  . enoxaparin (LOVENOX) injection  30 mg Subcutaneous Q24H  . furosemide  60 mg Intravenous  BID  . guaiFENesin  1,200 mg Oral BID  . hydrALAZINE  25 mg Oral Q8H  . ipratropium-albuterol  3 mL Nebulization TID  . Living Better with Heart Failure Book   Does not apply Once  . loratadine  10 mg Oral Daily  . methylPREDNISolone (SOLU-MEDROL) injection  40 mg Intravenous Q12H  . metoprolol tartrate  25 mg Oral BID  . piperacillin-tazobactam (ZOSYN)  IV  3.375 g Intravenous Q8H  . sodium chloride flush  3 mL Intravenous Q12H  . [START ON 05/25/2016] vancomycin  500 mg Intravenous Q24H  . vancomycin  1,000 mg Intravenous Once    Infusions: . nitroGLYCERIN 5 mcg/min (05/24/16 0800)    PRN Medications: sodium chloride, acetaminophen, ondansetron (ZOFRAN) IV, sodium chloride flush   Past Medical History  Diagnosis Date  . Essential hypertension   . Hyperlipidemia   . History of stroke   . Chronic diastolic CHF (congestive heart failure) (HCC)   . CKD (chronic kidney disease) stage 3, GFR 30-59 ml/min   . Pulmonary nodule     Left upper lobe 1 cm pulmonary nodule - chest CT July 2017  . COPD (chronic obstructive pulmonary disease) [facility](HCC)     Past Surgical History  Procedure Laterality Date  . Partial hip arthroplasty    . Appendectomy    . Tonsillectomy    . Abdominal hysterectomy    . Hemorrhoid surgery      Family History  Problem Relation Age of Onset  . Hypertension Father   . Hypertension Mother     Social History [name] reports that she has quit smoking. Her smoking use included Cigarettes.  She does not have any smokeless tobacco history on file. Ms. Eliasen reports that she does not drink alcohol.  Review of Systems Complete review of systems negative except as otherwise outlined in the clinical summary and also the following. Fatigue, intermittent leg edema, appetite fair.  Physical Examination Blood pressure 150/63, pulse 86, temperature 98.3 F (36.8 C), temperature source Oral, resp. rate 19, height 5\' 1"  (1.549 m), weight 122 lb 5.7 oz (55.5  kg), SpO2 96 %.  Intake/Output Summary (Last 24 hours) at 05/24/16 1018 Last data filed at 05/24/16 0800  Gross per 24 hour  Intake  143.5 ml  Output   2800 ml  Net -2656.5 ml   Telemetry: Sinus rhythm with PVCs, brief NSVT.  Gen.: Elderly woman in no distress. HEENT: Conjunctiva and lids normal, oropharynx clear. Neck: Supple, no elevated JVP or carotid bruits, no thyromegaly. Lungs: Clear anteriorly, decreased at the bases, nonlabored breathing at rest. Cardiac: Regular rate and rhythm, no S3, soft systolic murmur, no pericardial rub. Abdomen: Soft, nontender, bowel sounds present, no guarding or rebound. Extremities: Trace ankle edema, distal pulses 2+. Skin: Warm and dry. Musculoskeletal: No kyphosis. Neuropsychiatric: Alert and oriented x3, affect grossly appropriate.  Lab Results  Basic Metabolic Panel:  Recent Labs Lab 05/23/16 1330 05/24/16 0458  NA 140 141  K 4.0 4.4  CL 109 105  CO2 23 26  GLUCOSE 127* 142*  BUN 27* 35*  CREATININE 1.34* 1.40*  CALCIUM 8.7* 8.4*    CBC:  Recent Labs Lab 05/23/16 1330  WBC 18.8*  NEUTROABS 15.6*  HGB 10.5*  HCT 33.3*  MCV 87.9  PLT 186    Cardiac Enzymes:  Recent Labs Lab 05/23/16 1330 05/23/16 1727 05/23/16 2302 05/24/16 0458  TROPONINI 0.03* 0.03* 0.03* 0.03*    BNP: 1216  ECG I personally reviewed the tracing from 05/23/2016 which showed sinus rhythm with IVCD, rule out old anterior infarct pattern.  Imaging  Echocardiogram 05/09/2016: Study Conclusions  - Left ventricle: The cavity size was normal. Wall thickness was  increased in a pattern of mild LVH. Systolic function was normal.  The estimated ejection fraction was in the range of 55% to 60%.  Wall motion was normal; there were no regional wall motion  abnormalities. Doppler parameters are consistent with abnormal  left ventricular relaxation (grade 1 diastolic dysfunction).  Doppler parameters are consistent with high ventricular  filling  pressure. - Aortic valve: Moderately calcified annulus. Trileaflet;  moderately thickened leaflets. Valve area (VTI): 2.25 cm^2. Valve  area (Vmax): 2.08 cm^2. - Mitral valve: Moderately calcified annulus. Moderately thickened  leaflets . The findings are consistent with mild stenosis. There  was mild regurgitation. - Left atrium: The atrium was mildly to moderately dilated. - Technically adequate study.  Chest x-ray 05/23/2016: FINDINGS: The lungs are emphysematous. Since the most recent examination, right basilar airspace disease has worsened. The patient has a new small left pleural effusion with increased left basilar airspace disease. Small right effusion is noted. No pneumothorax. Heart size is normal. 1 cm left upper lobe nodule seen on the prior chest CT is not visible on this examination. Aortic atherosclerosis is noted. No focal bony abnormality.  IMPRESSION: Increased bibasilar airspace disease and small effusions worrisome for pneumonia and/or pulmonary edema.  Emphysema.  Atherosclerosis.  Impression  1. Presentation with acute shortness of breath and respiratory failure requiring BiPAP, symptomatically improved with diuresis. Chest x-ray shows bibasilar airspace disease and small effusions, suspect pulmonary edema given relatively acute onset symptoms. She has  had some mild cough, no fevers or chills. WBC elevated likely with steroids. Recently treated for diastolic heart failure earlier in the month, would be concerned about unstable CAD as potential etiology to her more acute symptoms although troponin I levels are not substantially elevated.  2. Incidentally noted coronary and aortic atherosclerosis by recent chest CT imaging.  3. Emphysema by recent chest imaging, has been recently treated by Dr. Juanetta Gosling. PFTs are pending as an outpatient. She has a prior history of smoking.  4. Left upper lobe 1 cm pulmonary nodule suspicious for malignancy per  chest CT report. Outpatient PET scan is anticipated.  5. CKD stage 3, current creatinine 1.4.  6. Essential hypertension, patient had hypertensive urgency at her presentation earlier in the month.   Recommendations  Discussed with patient, husband, two sons, and daughter-in-law present. Discussed overall situation and answered several questions. Anticipate transfer to the hospitalist team at [facility] later today, I spoke with Dr Kerry Hough. Our cardiology service will follow in consultation. Anticipate diagnostic cardiac catheterization, possibly tomorrow if she remains clinically stable for clear evaluation of the coronary anatomy in light of her recent symptoms. Also important to better understand cardiac status as she may need further invasive testing related to her pulmonary nodule depending on PET scan report. Potential risks and anticipated benefits discussed with patient and family. She is in agreement at this time.. Continue aspirin, blocker, hydralazine, and Norvasc. Decrease Lasix dose and follow-up BMET in the morning. Hold off on ACE inhibitor or ARB at this time. Start empiric statin therapy and check FLP.  Jonelle Sidle, M.D., F.A.C.C.

## 2016-05-24 NOTE — Progress Notes (Signed)
Left unit via stretcher with carelink. No distress noted.

## 2016-05-24 NOTE — Progress Notes (Signed)
Pharmacy Antibiotic Note  Sydney Jacobs is a 82 y.o. female admitted on 05/23/2016 with pneumonia.  [facility]  Plan:  Vancomycin 1000mg  IV x 1 then 500mg  IV q24h, renally adjusted Check trough at steady state, goal = 15-20 Zosyn 3.375gm IV q8h, EID Monitor labs, renal fxn, progress and c/s Deescalate ABX when improved / appropriate.    Height: 5\' 1"  (154.9 cm) Weight: 122 lb 5.7 oz (55.5 kg) IBW/kg (Calculated) : 47.8  Temp (24hrs), Avg:97.6 F (36.4 C), Min:97 F (36.1 C), Max:98.3 F (36.8 C)   Recent Labs Lab 05/23/16 1330 05/24/16 0458  WBC 18.8*  --   CREATININE 1.34* 1.40*    Estimated Creatinine Clearance: 24.6 mL/min (by C-G formula based on Cr of 1.4).    Allergies  Allergen Reactions  . Sulfa Antibiotics Other (See Comments)    unknown   Anti-infectives    Start     Dose/Rate Route Frequency Ordered Stop   05/25/16 0600  vancomycin (VANCOCIN) 500 mg in sodium chloride 0.9 % 100 mL IVPB     500 mg 100 mL/hr over 60 Minutes Intravenous Every 24 hours 05/24/16 0848     05/24/16 0900  piperacillin-tazobactam (ZOSYN) IVPB 3.375 g     3.375 g 12.5 mL/hr over 240 Minutes Intravenous Every 8 hours 05/24/16 0845     05/24/16 0900  vancomycin (VANCOCIN) IVPB 1000 mg/200 mL premix     1,000 mg 200 mL/hr over 60 Minutes Intravenous  Once 05/24/16 0846       Dose adjustments this admission: n/a  Recent Results (from the past 240 hour(s))  MRSA PCR Screening     Status: None   Collection Time: 05/23/16  6:40 PM  Result Value Ref Range Status   MRSA by PCR NEGATIVE NEGATIVE Final    Comment:        The GeneXpert MRSA Assay (FDA approved for NASAL specimens only), is one component of a comprehensive MRSA colonization surveillance program. It is not intended to diagnose MRSA infection nor to guide or monitor treatment for MRSA infections.    Thank you for allowing pharmacy to be a part of this patient's  care.  Valrie HartHall, Jocelynne Duquette A 05/24/2016 8:50 AM

## 2016-05-24 NOTE — Consult Note (Signed)
Consult requested by: [facility] Consult requested for acute on chronic respiratory failure:  HPI: This is a 82 year old who had been in the hospital with multiple medical problems. She was discharged home in improved condition but developed increasing problems with respiratory distress over the last 24-48 hours. She had been noticing that she was having increasing swelling of her legs. She had diastolic heart failure during her hospitalization. She also however had chronic kidney disease. When she came to the emergency department she had a chest x-ray that showed pleural effusion and her BNP was higher than on her previous admission. She was given BiPAP Lasix Solu-Medrol nebulizer treatments and has improved. She was found to have a lung mass during her last hospitalization and she was being scheduled for PET scan which has not been accomplished yet. She says this morning she feels much better  Past Medical History  Diagnosis Date  . Hypertension   . High cholesterol   . Stroke (HCC)   . Chronic diastolic CHF (congestive heart failure) (HCC)   . CKD (chronic kidney disease)      Family History  Problem Relation Age of Onset  . Hypertension Father   . Hypertension Mother      Social History   Social History  . Marital Status: Married    Spouse Name: N/A  . Number of Children: N/A  . Years of Education: N/A   Social History Main Topics  . Smoking status: Former Games developer  . Smokeless tobacco: None  . Alcohol Use: No  . Drug Use: No  . Sexual Activity: Not Asked   Other Topics Concern  . None   Social History Narrative     ROS: She's had dry cough. No chest pain. No fever. No hemoptysis. No other new symptoms. Otherwise per history and physical which I have reviewed    Objective: Vital signs in last 24 hours: Temp:  [97 F (36.1 C)-98.3 F (36.8 C)] 98.3 F (36.8 C) (07/18 0746) Pulse Rate:  [62-96] 83 (07/18 0800) Resp:  [11-36] 19 (07/18 0800) BP:  (139-193)/(67-89) 164/75 mmHg (07/18 0800) SpO2:  [87 %-100 %] 95 % (07/18 0800) FiO2 (%):  [30 %] 30 % (07/17 2348) Weight:  [55.5 kg (122 lb 5.7 oz)-55.792 kg (123 lb)] 55.5 kg (122 lb 5.7 oz) (07/18 0500) Weight change:  Last BM Date: 05/23/16  Intake/Output from previous day: 07/17 0701 - 07/18 0700 In: 140.5 [I.V.:63.5] Out: 2800 [Urine:2800]  PHYSICAL EXAM She is awake and alert lying flat on nasal oxygen and looks comfortable. Her pupils are reactive nose and throat are clear mucous membranes are moist her neck is supple without masses. Her chest shows some fine rales bilaterally. Her heart is regular without gallop. Her abdomen soft no masses are felt. She still has 1+ edema but that is apparently much better. Central nervous system exam grossly intact  Lab Results: Basic Metabolic Panel:  Recent Labs  16/10/96 1330 05/24/16 0458  NA 140 141  K 4.0 4.4  CL 109 105  CO2 23 26  GLUCOSE 127* 142*  BUN 27* 35*  CREATININE 1.34* 1.40*  CALCIUM 8.7* 8.4*   Liver Function Tests: No results for input(s): AST, ALT, ALKPHOS, BILITOT, PROT, ALBUMIN in the last 72 hours. No results for input(s): LIPASE, AMYLASE in the last 72 hours. No results for input(s): AMMONIA in the last 72 hours. CBC:  Recent Labs  05/23/16 1330  WBC 18.8*  NEUTROABS 15.6*  HGB 10.5*  HCT 33.3*  MCV  87.9  PLT 186   Cardiac Enzymes:  Recent Labs  05/23/16 1727 05/23/16 2302 05/24/16 0458  TROPONINI 0.03* 0.03* 0.03*   BNP: No results for input(s): PROBNP in the last 72 hours. D-Dimer: No results for input(s): DDIMER in the last 72 hours. CBG: No results for input(s): GLUCAP in the last 72 hours. Hemoglobin A1C: No results for input(s): HGBA1C in the last 72 hours. Fasting Lipid Panel: No results for input(s): CHOL, HDL, LDLCALC, TRIG, CHOLHDL, LDLDIRECT in the last 72 hours. Thyroid Function Tests: No results for input(s): TSH, T4TOTAL, FREET4, T3FREE, THYROIDAB in the last 72  hours. Anemia Panel: No results for input(s): VITAMINB12, FOLATE, FERRITIN, TIBC, IRON, RETICCTPCT in the last 72 hours. Coagulation:  Recent Labs  05/23/16 1330  LABPROT 13.2  INR 0.98   Urine Drug Screen: Drugs of Abuse  No results found for: LABOPIA, COCAINSCRNUR, LABBENZ, AMPHETMU, THCU, LABBARB  Alcohol Level: No results for input(s): ETH in the last 72 hours. Urinalysis: No results for input(s): COLORURINE, LABSPEC, PHURINE, GLUCOSEU, HGBUR, BILIRUBINUR, KETONESUR, PROTEINUR, UROBILINOGEN, NITRITE, LEUKOCYTESUR in the last 72 hours.  Invalid input(s): APPERANCEUR Misc. Labs:   ABGS:  Recent Labs  05/23/16 1413  PHART 7.410  PO2ART 70.8*  HCO3 24.1*     MICROBIOLOGY: Recent Results (from the past 240 hour(s))  MRSA PCR Screening     Status: None   Collection Time: 05/23/16  6:40 PM  Result Value Ref Range Status   MRSA by PCR NEGATIVE NEGATIVE Final    Comment:        The GeneXpert MRSA Assay (FDA approved for NASAL specimens only), is one component of a comprehensive MRSA colonization surveillance program. It is not intended to diagnose MRSA infection nor to guide or monitor treatment for MRSA infections.     Studies/Results: Dg Chest Portable 1 View  05/23/2016  CLINICAL DATA:  Shortness of breath beginning this morning. EXAM: PORTABLE CHEST 1 VIEW COMPARISON:  Single-view of the chest 05/12/2016 and 05/08/2016. CT chest 05/08/2016. FINDINGS: The lungs are emphysematous. Since the most recent examination, right basilar airspace disease has worsened. The patient has a new small left pleural effusion with increased left basilar airspace disease. Small right effusion is noted. No pneumothorax. Heart size is normal. 1 cm left upper lobe nodule seen on the prior chest CT is not visible on this examination. Aortic atherosclerosis is noted. No focal bony abnormality. IMPRESSION: Increased bibasilar airspace disease and small effusions worrisome for pneumonia  and/or pulmonary edema. Emphysema. Atherosclerosis. Electronically Signed   By: Drusilla Kanner M.D.   On: 05/23/2016 13:50    Medications:  Prior to Admission:  Prescriptions prior to admission  Medication Sig Dispense Refill Last Dose  . albuterol (PROVENTIL HFA;VENTOLIN HFA) 108 (90 Base) MCG/ACT inhaler Inhale 2 puffs into the lungs every 6 (six) hours as needed for wheezing or shortness of breath. Pharmacy please instruct in proper techinique 1 Inhaler 0 05/23/2016 at Unknown time  . allopurinol (ZYLOPRIM) 100 MG tablet Take 1 tablet by mouth at bedtime.    05/22/2016 at Unknown time  . amLODipine (NORVASC) 10 MG tablet Take 1 tablet (10 mg total) by mouth daily. 30 tablet 0 05/23/2016 at Unknown time  . aspirin EC 81 MG tablet Take 81 mg by mouth daily.   05/23/2016 at 1030  . cetirizine (ZYRTEC) 10 MG tablet Take 10 mg by mouth daily.   05/23/2016 at Unknown time  . metoprolol tartrate (LOPRESSOR) 25 MG tablet Take 1 tablet (25 mg total)  by mouth 2 (two) times daily. 60 tablet 0 05/23/2016 at 0630  . Vitamin D, Ergocalciferol, (DRISDOL) 50000 units CAPS capsule Take 1 capsule by mouth once a week. Takes on Fridays.   05/20/2016  . [DISCONTINUED] predniSONE (DELTASONE) 10 MG tablet Take daily by mouth: 40 mg x3 days, then 20 mg x3 days, then 10 mg x3 days, then stop. (Patient not taking: Reported on 05/23/2016) 21 tablet 0 Completed Course at Unknown time   Scheduled: . allopurinol  100 mg Oral QHS  . amLODipine  10 mg Oral Daily  . antiseptic oral rinse  7 mL Mouth Rinse q12n4p  . aspirin EC  81 mg Oral Daily  . chlorhexidine  15 mL Mouth Rinse BID  . enoxaparin (LOVENOX) injection  30 mg Subcutaneous Q24H  . furosemide  60 mg Intravenous BID  . guaiFENesin  1,200 mg Oral BID  . ipratropium-albuterol  3 mL Nebulization TID  . Living Better with Heart Failure Book   Does not apply Once  . loratadine  10 mg Oral Daily  . metoprolol tartrate  25 mg Oral BID  . sodium chloride flush  3 mL  Intravenous Q12H   Continuous: . nitroGLYCERIN 5 mcg/min (05/24/16 0800)   ONG:EXBMWUPRN:sodium chloride, acetaminophen, ondansetron (ZOFRAN) IV, sodium chloride flush  Assesment: She has acute on chronic hypoxic respiratory failure multifactorial. She seems to have at least some element of acute on chronic diastolic heart failure but she also has COPD. She has chronic kidney disease which appears to be stable. She is better with treatment. She has bilateral infiltrates and I think these are most likely pulmonary edema but could represent multi focal pneumonia. I think it's safest to treat her for that at least temporarily until we sort out how much of this is heart failure and how much of this is lung disease Active Problems:   CKD (chronic kidney disease) stage 3, GFR 30-59 ml/min   Essential hypertension   Acute respiratory failure with hypoxia (HCC)   Acute on chronic diastolic CHF (congestive heart failure) (HCC)   COPD (chronic obstructive pulmonary disease) (HCC)    Plan: Add antibiotics. Continue relatively low-dose steroids    LOS: 1 day   Darya Bigler L 05/24/2016, 8:29 AM

## 2016-05-24 NOTE — Progress Notes (Signed)
Updated report given to [facility].

## 2016-05-24 NOTE — Progress Notes (Addendum)
PROGRESS NOTE    Sydney Jacobs  ZOX:[id number] DOB: 11/20/1933 DOA: 05/23/2016 PCP: Colette Ribas, MD  Outpatient Specialists:    Brief Narrative:  89 yof with history of diastolic CHF, CKD Stage III and COPD presented with shortness of breath, significant volume overload, and mild chest pain secondary to cough. In the ED, she was noted to be in respiratory distress for which she was started on BiPAP with improvement in shortness of breath. CXR with evidence of pleural effusion and BNP noted to be higher then prior admission. Also, could not rule out pneumonia. She was started on lasix, antibiotics, solumedrol, and nebs.    Assessment & Plan:   Active Problems:   CKD (chronic kidney disease) stage 3, GFR 30-59 ml/min   Essential hypertension   Acute respiratory failure with hypoxia (HCC)   Acute on chronic diastolic CHF (congestive heart failure) (HCC)   COPD (chronic obstructive pulmonary disease) (HCC)   1. Acute respiratory failure with hypoxia. Likely related to CHF exacerbation.patient has been wean off bipap, currently on nasal cannula. Continue to wean oxygen as tolerated. Pulmonology consult requested.  2. Acute on chronic diastolic CHF, ECHO on last admission with normal EF. Continue on IV lasix and BB. Volume status is -2.6L since admission. She is not on ACE-I due to renal function. Wean off nitroglycerin infusion. Since this is her second admission in 30 days for CHF, will consult Cardiology.  3. Possible HCAP. Started on vancomycin and zosyn.  4. COPD. Stable. No evidence of wheezing. Started on low dose steroids per pulmonology 5. Essential HTN. Continue on lopressor. Add hydralazine 6. CKD Stage III. Creatinine improved since last admission and remains stable. Continue to monitor with diuresis.   DVT prophylaxis: Lovenox Code Status: Full Family Communication: discussed with patient and daughter in law at the bedside Disposition Plan: discharge home once  improved   Consultants:   Pulmonology  cardiology  Procedures:     Antimicrobials:   Vancomycin 7/18>>  Zosyn 7/18>>   Subjective: Feeling better today. Shortness of breath improving. No chest pain.  Objective: Filed Vitals:   05/24/16 0300 05/24/16 0400 05/24/16 0500 05/24/16 0600  BP: 146/70 157/68 158/71 159/74  Pulse: 73 78 79 77  Temp:  97 F (36.1 C)    TempSrc:  Oral    Resp: 19 27 18 15   Height:      Weight:   55.5 kg (122 lb 5.7 oz)   SpO2: 97% 97% 98% 95%    Intake/Output Summary (Last 24 hours) at 05/24/16 0639 Last data filed at 05/24/16 0500  Gross per 24 hour  Intake   68.5 ml  Output   2450 ml  Net -2381.5 ml   Filed Weights   05/23/16 1325 05/24/16 0500  Weight: 55.792 kg (123 lb) 55.5 kg (122 lb 5.7 oz)    Examination:  General exam: Appears calm and comfortable on nasal cannula  Respiratory system: crackles at bases. Respiratory effort normal. Cardiovascular system: S1 & S2 heard, RRR. No murmurs, rubs, gallops or clicks. 1+ pedal edema. Gastrointestinal system: Abdomen is nondistended, soft and nontender. No organomegaly or masses felt. Normal bowel sounds heard. Central nervous system: Alert and oriented. No focal neurological deficits. Extremities: Symmetric 5 x 5 power. Skin: No rashes, lesions or ulcers Psychiatry: Judgement and insight appear normal. Mood & affect appropriate.     Data Reviewed: I have personally reviewed following labs and imaging studies  CBC:  Recent Labs Lab 05/23/16 1330  WBC 18.8*  NEUTROABS 15.6*  HGB 10.5*  HCT 33.3*  MCV 87.9  PLT 186   Basic Metabolic Panel:  Recent Labs Lab 05/23/16 1330 05/24/16 0458  NA 140 141  K 4.0 4.4  CL 109 105  CO2 23 26  GLUCOSE 127* 142*  BUN 27* 35*  CREATININE 1.34* 1.40*  CALCIUM 8.7* 8.4*   GFR: Estimated Creatinine Clearance: 24.6 mL/min (by C-G formula based on Cr of 1.4). Liver Function Tests: No results for input(s): AST, ALT, ALKPHOS,  BILITOT, PROT, ALBUMIN in the last 168 hours. No results for input(s): LIPASE, AMYLASE in the last 168 hours. No results for input(s): AMMONIA in the last 168 hours. Coagulation Profile:  Recent Labs Lab 05/23/16 1330  INR 0.98   Cardiac Enzymes:  Recent Labs Lab 05/23/16 1330 05/23/16 1727 05/23/16 2302 05/24/16 0458  TROPONINI 0.03* 0.03* 0.03* 0.03*   BNP (last 3 results) No results for input(s): PROBNP in the last 8760 hours. HbA1C: No results for input(s): HGBA1C in the last 72 hours. CBG: No results for input(s): GLUCAP in the last 168 hours. Lipid Profile: No results for input(s): CHOL, HDL, LDLCALC, TRIG, CHOLHDL, LDLDIRECT in the last 72 hours. Thyroid Function Tests: No results for input(s): TSH, T4TOTAL, FREET4, T3FREE, THYROIDAB in the last 72 hours. Anemia Panel: No results for input(s): VITAMINB12, FOLATE, FERRITIN, TIBC, IRON, RETICCTPCT in the last 72 hours. Urine analysis:    Component Value Date/Time   COLORURINE YELLOW 05/11/2016 2210   APPEARANCEUR CLEAR 05/11/2016 2210   LABSPEC 1.015 05/11/2016 2210   PHURINE 5.0 05/11/2016 2210   GLUCOSEU NEGATIVE 05/11/2016 2210   HGBUR NEGATIVE 05/11/2016 2210   BILIRUBINUR NEGATIVE 05/11/2016 2210   KETONESUR NEGATIVE 05/11/2016 2210   PROTEINUR 30* 05/11/2016 2210   UROBILINOGEN 0.2 04/22/2010 1413   NITRITE NEGATIVE 05/11/2016 2210   LEUKOCYTESUR TRACE* 05/11/2016 2210   Sepsis Labs: @LABRCNTIP (procalcitonin:4,lacticidven:4)  ) Recent Results (from the past 240 hour(s))  MRSA PCR Screening     Status: None   Collection Time: 05/23/16  6:40 PM  Result Value Ref Range Status   MRSA by PCR NEGATIVE NEGATIVE Final    Comment:        The GeneXpert MRSA Assay (FDA approved for NASAL specimens only), is one component of a comprehensive MRSA colonization surveillance program. It is not intended to diagnose MRSA infection nor to guide or monitor treatment for MRSA infections.           Radiology Studies: Dg Chest Portable 1 View  05/23/2016  CLINICAL DATA:  Shortness of breath beginning this morning. EXAM: PORTABLE CHEST 1 VIEW COMPARISON:  Single-view of the chest 05/12/2016 and 05/08/2016. CT chest 05/08/2016. FINDINGS: The lungs are emphysematous. Since the most recent examination, right basilar airspace disease has worsened. The patient has a new small left pleural effusion with increased left basilar airspace disease. Small right effusion is noted. No pneumothorax. Heart size is normal. 1 cm left upper lobe nodule seen on the prior chest CT is not visible on this examination. Aortic atherosclerosis is noted. No focal bony abnormality. IMPRESSION: Increased bibasilar airspace disease and small effusions worrisome for pneumonia and/or pulmonary edema. Emphysema. Atherosclerosis. Electronically Signed   By: Drusilla Kannerhomas  Dalessio M.D.   On: 05/23/2016 13:50        Scheduled Meds: . allopurinol  100 mg Oral QHS  . amLODipine  10 mg Oral Daily  . antiseptic oral rinse  7 mL Mouth Rinse q12n4p  . aspirin EC  81 mg Oral Daily  .  chlorhexidine  15 mL Mouth Rinse BID  . enoxaparin (LOVENOX) injection  30 mg Subcutaneous Q24H  . furosemide  60 mg Intravenous BID  . guaiFENesin  1,200 mg Oral BID  . ipratropium-albuterol  3 mL Nebulization TID  . Living Better with Heart Failure Book   Does not apply Once  . loratadine  10 mg Oral Daily  . metoprolol tartrate  25 mg Oral BID  . sodium chloride flush  3 mL Intravenous Q12H   Continuous Infusions: . nitroGLYCERIN 10 mcg/min (05/23/16 1736)     LOS: 1 day    Time spent:    Erick Blinks, MD  [facility] If 7PM-7AM, please contact night-coverage [email or web address] Password [facility] 05/24/2016, 6:39 AM    Addendum 10:00:   Discussed case with Dr. Diona Browner who has recommended transfer to [facility] for cardiac catheterization. Since she has multiple medical problems, she will stay on hospitalist service.  Discussed with Dr. Konrad Dolores who has accepted in transfer. She will be transferred to SDU for another 24 hours, after which she can likely be transitioned to telemetry. She will likely have cath tomorrow. Family is in agreement.  MEMON,JEHANZEB

## 2016-05-25 LAB — BASIC METABOLIC PANEL
Anion gap: 9 (ref 5–15)
BUN: 46 mg/dL — ABNORMAL HIGH (ref 6–20)
CALCIUM: 7.9 mg/dL — AB (ref 8.9–10.3)
CO2: 24 mmol/L (ref 22–32)
CREATININE: 1.88 mg/dL — AB (ref 0.44–1.00)
Chloride: 105 mmol/L (ref 101–111)
GFR calc non Af Amer: 24 mL/min — ABNORMAL LOW (ref >60)
GFR, EST AFRICAN AMERICAN: 28 mL/min — AB (ref >60)
Glucose, Bld: 125 mg/dL — ABNORMAL HIGH (ref 65–99)
Potassium: 4 mmol/L (ref 3.5–5.1)
SODIUM: 138 mmol/L (ref 135–145)

## 2016-05-25 MED ORDER — SODIUM CHLORIDE 0.9% FLUSH: 3.0000 mL | INTRAVENOUS | Status: DC | PRN

## 2016-05-25 MED ORDER — SODIUM CHLORIDE 0.9 % IV SOLN: 250.0000 mL | INTRAVENOUS | Status: DC | PRN

## 2016-05-25 MED ORDER — IPRATROPIUM-ALBUTEROL 0.5-2.5 (3) MG/3ML IN SOLN
3.0000 mL | Freq: Two times a day (BID) | RESPIRATORY_TRACT | Status: DC
  Administered 2016-05-25 – 2016-05-26 (×3): 3 mL via RESPIRATORY_TRACT
  Filled 2016-05-25 (×4): qty 3

## 2016-05-25 MED ORDER — SODIUM CHLORIDE 0.9% FLUSH
3.0000 mL | Freq: Two times a day (BID) | INTRAVENOUS | Status: DC
  Administered 2016-05-25: 3 mL via INTRAVENOUS

## 2016-05-25 MED ORDER — SODIUM CHLORIDE 0.9% FLUSH
3.0000 mL | Freq: Two times a day (BID) | INTRAVENOUS | Status: DC
  Administered 2016-05-25 – 2016-05-26 (×2): 3 mL via INTRAVENOUS

## 2016-05-25 MED ORDER — SODIUM CHLORIDE 0.9 % IV SOLN
INTRAVENOUS | Status: DC
  Administered 2016-05-25: 09:00:00 via INTRAVENOUS

## 2016-05-25 MED ORDER — ASPIRIN 81 MG PO CHEW
81.0000 mg | CHEWABLE_TABLET | ORAL | Status: AC
  Administered 2016-05-26: 81 mg via ORAL
  Filled 2016-05-25: qty 1

## 2016-05-25 MED ORDER — SODIUM CHLORIDE 0.9 % IV SOLN
INTRAVENOUS | Status: DC
  Administered 2016-05-25: 20:00:00 via INTRAVENOUS

## 2016-05-25 NOTE — Progress Notes (Signed)
   Subjective: No CP  Breahting is better   Objective: Filed Vitals:   05/24/16 2217 05/24/16 2342 05/25/16 0500 05/25/16 0821  BP: 143/63   146/69  Pulse: 76   83  Temp: 97.6 F (36.4 C) 98.2 F (36.8 C)  98.1 F (36.7 C)  TempSrc: Oral Oral  Oral  Resp: 16   24  Height: 4\' 10"  (1.473 m)     Weight: 121 lb (54.885 kg)  122 lb (55.339 kg)   SpO2: 97%   93%   Weight change: -2 lb (-0.907 kg)  Intake/Output Summary (Last 24 hours) at 05/25/16 0846 Last data filed at 05/25/16 0700  Gross per 24 hour  Intake    907 ml  Output   1400 ml  Net   -493 ml   Net neg 3.15 L    General: Alert, awake, oriented x3, in no acute distress Neck:  JVP is normal Heart: Regular rate and rhythm, without murmurs, rubs, gallops.  Lungs: Clear to auscultation.  No rales or wheezes. Exemities:  No edema.   Neuro: Grossly intact, nonfocal.  Tele: SR    Lab Results: No results found for this or any previous visit (from the past 24 hour(s)).  Studies/Results: Dg Chest Port 1 View  05/24/2016  CLINICAL DATA:  Patient with respiratory failure. EXAM: PORTABLE CHEST 1 VIEW COMPARISON:  Chest radiograph 05/23/2016. FINDINGS: Multiple monitoring leads overlie the patient. Stable cardiomegaly with tortuosity and calcification of the thoracic aorta. Persistent bibasilar airspace opacities. Small bilateral pleural effusions. No pneumothorax. Known 1 cm left upper lobe nodule not well demonstrated on current evaluation. IMPRESSION: Unchanged bibasilar airspace opacities and small bilateral pleural effusions. Aortic vascular calcification. Electronically Signed   By: Annia Beltrew  Davis M.D.   On: 05/24/2016 09:52    Medications:Reviewed   @PROBHOSP @  1  Acute diastolic CHF  Clinically improved from yesterday  Concern for abrupt presentation and recent discharge with similar symtpoms    Seen by Ival BibleS McDowell yesterday  Recomm L heart cath given coronary calcifications.  Discussed with pt and family who agree to  proceed    2  Coronary atheroscleroisis  Cath today    3  Lung notdule  4 HTN  BP is improved    5  Hx CVA in 2008  Hx of CV dz    4.  CKD  Will need to follow post  Continue hydration    LOS: 2 days   Dietrich Patesaula Tameron Lama 05/25/2016, 8:46 AM

## 2016-05-25 NOTE — Progress Notes (Signed)
[place] TEAM 1 - Stepdown/ICU TEAM  Sydney Jacobs  JXB:[id number] DOB: 11-07-1934 DOA: 05/23/2016 PCP: Colette Ribas, MD  Outpatient Specialists:   Brief Narrative:  82 yo f with history of diastolic CHF, CKD Stage III, and COPD presented with shortness of breath, significant volume overload, and mild chest pain secondary to cough. In the ED, she was noted to be in respiratory distress for which she was started on BiPAP with improvement. CXR with evidence of pleural effusion and BNP noted to be higher than prior admission.  Assessment & Plan:   Acute respiratory failure with hypoxia due to Acute on chronic diastolic CHF TTE 11/09/06 w/ normal EF and grade 1 DD - not on ACE-I due to renal function - net negative ~2.3L since admit - currently hydrating due to renal failure  Filed Weights   05/24/16 0500 05/24/16 2217 05/25/16 0500  Weight: 55.5 kg (122 lb 5.7 oz) 54.885 kg (121 lb) 55.339 kg (122 lb)    Chest pain - ?CAD  For cardiac cath when renal fxn stable/improved  Acute kidney failure on CKD Stage III Gently hydrating today - follow up in AM - hold ACEi/ARB - crt 1.34 - 2.16 earlier this month during admission   Recent Labs Lab 05/23/16 1330 05/24/16 0458 05/25/16 0930  CREATININE 1.34* 1.40* 1.88*    Possible HCAP Cont Zosyn to complete 3 days of tx - d/c Vanc as MRSA screen negative and no compelling evidence of acute infection   1 cm left upper lobe pulmonary nodule Will need outpatient PET scan/further evaluation   COPD Stable at present w/ no evidence of an acute exacerbation   Essential HTN BP reasonably controlled - follow w/o change for now    DVT prophylaxis: Lovenox Code Status: Full Family Communication: spoke w/ DIL at bedside  Disposition Plan: discharge home once improved   Consultants:  Pulmonology Cardiology  Procedures:  none  Antimicrobials:  Vancomycin 7/18 > Zosyn 7/18 > 7/19   Subjective: The patient is resting  comfortably presently.  She states she feels much better.  She denies shortness of breath cough chest pain nausea vomiting or abdominal pain.  Objective: Filed Vitals:   05/25/16 1148 05/25/16 1200 05/25/16 1300 05/25/16 1305  BP: 149/65 144/63  144/63  Pulse: 81 79 87   Temp: 98.3 F (36.8 C)     TempSrc: Oral     Resp: Height:      Weight:      SpO2: 93% 91% 91%     Intake/Output Summary (Last 24 hours) at 05/25/16 1604 Last data filed at 05/25/16 1300  Gross per 24 hour  Intake   1448 ml  Output    400 ml  Net   1048 ml   Filed Weights   05/24/16 0500 05/24/16 2217 05/25/16 0500  Weight: 55.5 kg (122 lb 5.7 oz) 54.885 kg (121 lb) 55.339 kg (122 lb)    Examination: General: No acute respiratory distress Lungs: Clear to auscultation bilaterally without wheezes or crackles Cardiovascular: Regular rate and rhythm without murmur gallop or rub normal S1 and S2 Abdomen: Nontender, nondistended, soft, bowel sounds positive, no rebound, no ascites, no appreciable mass Extremities: No significant cyanosis, clubbing, or edema bilateral lower extremities    CBC:  Recent Labs Lab 05/23/16 1330  WBC 18.8*  NEUTROABS 15.6*  HGB 10.5*  HCT 33.3*  MCV 87.9  PLT 186   Basic Metabolic Panel:  Recent Labs Lab 05/23/16 1330 05/24/16  0458 05/25/16 0930  NA 140 141 138  K 4.0 4.4 4.0  CL 109 105 105  CO2 23 26 24   GLUCOSE 127* 142* 125*  BUN 27* 35* 46*  CREATININE 1.34* 1.40* 1.88*  CALCIUM 8.7* 8.4* 7.9*   GFR: Estimated Creatinine Clearance: 17.9 mL/min (by C-G formula based on Cr of 1.88).   Liver Function Tests: No results for input(s): AST, ALT, ALKPHOS, BILITOT, PROT, ALBUMIN in the last 168 hours. No results for input(s): LIPASE, AMYLASE in the last 168 hours. No results for input(s): AMMONIA in the last 168 hours.   Coagulation Profile:  Recent Labs Lab 05/23/16 1330  INR 0.98   Cardiac Enzymes:  Recent Labs Lab 05/23/16 1330  05/23/16 1727 05/23/16 2302 05/24/16 0458  TROPONINI 0.03* 0.03* 0.03* 0.03*    Recent Results (from the past 240 hour(s))  MRSA PCR Screening     Status: None   Collection Time: 05/23/16  6:40 PM  Result Value Ref Range Status   MRSA by PCR NEGATIVE NEGATIVE Final    Comment:        The GeneXpert MRSA Assay (FDA approved for NASAL specimens only), is one component of a comprehensive MRSA colonization surveillance program. It is not intended to diagnose MRSA infection nor to guide or monitor treatment for MRSA infections.      Radiology Studies: Dg Chest Port 1 View  05/24/2016  CLINICAL DATA:  Patient with respiratory failure. EXAM: PORTABLE CHEST 1 VIEW COMPARISON:  Chest radiograph 05/23/2016. FINDINGS: Multiple monitoring leads overlie the patient. Stable cardiomegaly with tortuosity and calcification of the thoracic aorta. Persistent bibasilar airspace opacities. Small bilateral pleural effusions. No pneumothorax. Known 1 cm left upper lobe nodule not well demonstrated on current evaluation. IMPRESSION: Unchanged bibasilar airspace opacities and small bilateral pleural effusions. Aortic vascular calcification. Electronically Signed   By: Annia Beltrew  Davis M.D.   On: 05/24/2016 09:52     Scheduled Meds: . allopurinol  100 mg Oral QHS  . amLODipine  10 mg Oral Daily  . antiseptic oral rinse  7 mL Mouth Rinse q12n4p  . [START ON 05/26/2016] aspirin  81 mg Oral Pre-Cath  . aspirin EC  81 mg Oral Daily  . atorvastatin  80 mg Oral q1800  . chlorhexidine  15 mL Mouth Rinse BID  . enoxaparin (LOVENOX) injection  30 mg Subcutaneous Q24H  . guaiFENesin  1,200 mg Oral BID  . hydrALAZINE  25 mg Oral Q8H  . ipratropium-albuterol  3 mL Nebulization TID  . Living Better with Heart Failure Book   Does not apply Once  . loratadine  10 mg Oral Daily  . methylPREDNISolone (SOLU-MEDROL) injection  40 mg Intravenous Q12H  . metoprolol tartrate  25 mg Oral BID  . piperacillin-tazobactam  (ZOSYN)  IV  3.375 g Intravenous Q8H  . sodium chloride flush  3 mL Intravenous Q12H  . sodium chloride flush  3 mL Intravenous Q12H  . vancomycin  500 mg Intravenous Q24H   Continuous Infusions: . sodium chloride 75 mL/hr at 05/25/16 1300  . nitroGLYCERIN 5 mcg/min (05/25/16 1300)     LOS: 2 days    Lonia BloodJeffrey T. Aniken Monestime, MD [facility] Office  [phone] Pager - Text Page per Amion as per below:  On-Call/Text Page:      [email or web address]      password [email or web address]  If 7PM-7AM, please contact night-coverage [email or web address] Password [email or web address] 05/25/2016, 4:36 PM

## 2016-05-25 NOTE — Care Management Important Message (Signed)
Important Message  Patient Details  Name: Sydney Jacobs MRN: [id number] Date of Birth: 02/23/1934   Medicare Important Message Given:  Yes    Bernadette HoitShoffner, Kolby Myung Coleman 05/25/2016, 10:02 AM

## 2016-05-25 NOTE — Progress Notes (Signed)
Interventional Note:  Patient was scheduled for heart catheterization today. Labs just resulted and creatinine is significantly elevated from yesterday. The patient is artery received IV diuretic this morning. Risk of acute kidney injury secondary to contrast nephropathy is high and after discussion with Dr. Tenny Crawoss, recommend delaying heart catheterization until tomorrow. Will stop Lasix and gently hydrate overnight. Labs in the morning. Discussed with patient and family at bedside.  Tonny BollmanCooper, Blessings Inglett 05/25/2016 10:45 AM

## 2016-05-26 ENCOUNTER — Encounter (HOSPITAL_COMMUNITY)
Admission: EM | Disposition: A | Discharge status: Home / Self Care | Payer: Self-pay | Source: Home / Self Care | Attending: Internal Medicine

## 2016-05-26 HISTORY — PX: CARDIAC CATHETERIZATION: SHX172

## 2016-05-26 LAB — BASIC METABOLIC PANEL
Anion gap: 8 (ref 5–15)
Anion gap: 8 (ref 5–15)
BUN: 56 mg/dL — AB (ref 6–20)
BUN: 49 mg/dL — AB (ref 6–20)
CHLORIDE: 108 mmol/L (ref 101–111)
CHLORIDE: 108 mmol/L (ref 101–111)
CO2: 24 mmol/L (ref 22–32)
CO2: 26 mmol/L (ref 22–32)
CREATININE: 2.09 mg/dL — AB (ref 0.44–1.00)
CREATININE: 1.91 mg/dL — AB (ref 0.44–1.00)
Calcium: 7.4 mg/dL — ABNORMAL LOW (ref 8.9–10.3)
Calcium: 8 mg/dL — ABNORMAL LOW (ref 8.9–10.3)
GFR calc Af Amer: 25 mL/min — ABNORMAL LOW (ref >60)
GFR calc Af Amer: 28 mL/min — ABNORMAL LOW (ref >60)
GFR calc non Af Amer: 21 mL/min — ABNORMAL LOW (ref >60)
GFR calc non Af Amer: 24 mL/min — ABNORMAL LOW (ref >60)
GLUCOSE: 108 mg/dL — AB (ref 65–99)
GLUCOSE: 98 mg/dL (ref 65–99)
POTASSIUM: 4 mmol/L (ref 3.5–5.1)
POTASSIUM: 3.8 mmol/L (ref 3.5–5.1)
Sodium: 140 mmol/L (ref 135–145)
Sodium: 142 mmol/L (ref 135–145)

## 2016-05-26 SURGERY — LEFT HEART CATH AND CORONARY ANGIOGRAPHY

## 2016-05-26 MED ORDER — LIDOCAINE HCL (PF) 1 % IJ SOLN
INTRAMUSCULAR | Status: DC | PRN
  Administered 2016-05-26: 18 mL via SUBCUTANEOUS

## 2016-05-26 MED ORDER — IOPAMIDOL (ISOVUE-370) INJECTION 76%
INTRAVENOUS | Status: AC
  Filled 2016-05-26: qty 100

## 2016-05-26 MED ORDER — SODIUM CHLORIDE 0.9% FLUSH: 3.0000 mL | INTRAVENOUS | Status: DC | PRN

## 2016-05-26 MED ORDER — FUROSEMIDE 10 MG/ML IJ SOLN
60.0000 mg | Freq: Once | INTRAMUSCULAR | Status: AC
  Administered 2016-05-26: 60 mg via INTRAVENOUS
  Filled 2016-05-26: qty 6

## 2016-05-26 MED ORDER — ONDANSETRON HCL 4 MG/2ML IJ SOLN: 4.0000 mg | Freq: Four times a day (QID) | INTRAMUSCULAR | Status: DC | PRN

## 2016-05-26 MED ORDER — ACETAMINOPHEN 325 MG PO TABS: 650.0000 mg | ORAL_TABLET | ORAL | Status: DC | PRN

## 2016-05-26 MED ORDER — SODIUM CHLORIDE 0.9% FLUSH
3.0000 mL | Freq: Two times a day (BID) | INTRAVENOUS | Status: DC
  Administered 2016-05-26 – 2016-05-27 (×2): 3 mL via INTRAVENOUS

## 2016-05-26 MED ORDER — ALBUTEROL SULFATE (2.5 MG/3ML) 0.083% IN NEBU
2.5000 mg | INHALATION_SOLUTION | RESPIRATORY_TRACT | Status: DC | PRN
Start: 2016-05-26 — End: 2016-05-27
  Administered 2016-05-26: 2.5 mg via RESPIRATORY_TRACT
  Filled 2016-05-26: qty 3

## 2016-05-26 MED ORDER — SODIUM CHLORIDE 0.9 % IV SOLN: INTRAVENOUS | Status: AC

## 2016-05-26 MED ORDER — HEPARIN (PORCINE) IN NACL 2-0.9 UNIT/ML-% IJ SOLN
INTRAMUSCULAR | Status: AC
  Filled 2016-05-26: qty 1500

## 2016-05-26 MED ORDER — HEPARIN (PORCINE) IN NACL 2-0.9 UNIT/ML-% IJ SOLN
INTRAMUSCULAR | Status: DC | PRN
  Administered 2016-05-26: 1500 mL

## 2016-05-26 MED ORDER — IOPAMIDOL (ISOVUE-370) INJECTION 76%
INTRAVENOUS | Status: DC | PRN
  Administered 2016-05-26: 25 mL via INTRA_ARTERIAL

## 2016-05-26 MED ORDER — SODIUM CHLORIDE 0.9 % IV SOLN: 250.0000 mL | INTRAVENOUS | Status: DC | PRN

## 2016-05-26 MED ORDER — LIDOCAINE HCL (PF) 1 % IJ SOLN
INTRAMUSCULAR | Status: AC
  Filled 2016-05-26: qty 30

## 2016-05-26 SURGICAL SUPPLY — 8 items
CATH INFINITI 5FR MULTPACK ANG (CATHETERS) ×3 IMPLANT
CATH SOFT-VU 4F 65 STRAIGHT (CATHETERS) ×1 IMPLANT
CATH SOFT-VU STRAIGHT 4F 65CM (CATHETERS) ×2
KIT HEART LEFT (KITS) ×3 IMPLANT
PACK CARDIAC CATHETERIZATION (CUSTOM PROCEDURE TRAY) ×3 IMPLANT
SHEATH PINNACLE 5F 10CM (SHEATH) ×3 IMPLANT
TRANSDUCER W/STOPCOCK (MISCELLANEOUS) ×3 IMPLANT
WIRE EMERALD 3MM-J .035X150CM (WIRE) ×3 IMPLANT

## 2016-05-26 NOTE — Progress Notes (Signed)
PROGRESS NOTE    Sydney Jacobs  ZOX:[id number] DOB: June 03, 1934 DOA: 05/23/2016 PCP: Colette Ribas, MD   Brief Narrative:  82 yo Wf PMHx CVA, Chronic Diastolic CHF, CKD Stage III, and COPD   Presented with shortness of breath, significant volume overload, and mild chest pain secondary to cough. In the ED, she was noted to be in respiratory distress for which she was started on BiPAP with improvement. CXR with evidence of pleural effusion and BNP noted to be higher than prior admission.   Assessment & Plan:   Active Problems:   CKD (chronic kidney disease) stage 3, GFR 30-59 ml/min   Essential hypertension   Acute respiratory failure with hypoxia (HCC)   Acute on chronic diastolic CHF (congestive heart failure) (HCC)   COPD (chronic obstructive pulmonary disease) (HCC)   Acute respiratory failure with hypoxia /Acute on chronic Diastolic CHF -TTE 05/09/16 w/ normal EF and grade 1 DD  - not on ACE-I due to renal function -Strict in and out since admission -3.8 L -Daily weight Filed Weights   05/24/16 2217 05/25/16 0500 05/26/16 0500  Weight: 54.885 kg (121 lb) 55.339 kg (122 lb) 55.883 kg (123 lb 3.2 oz)  -Patient in LEADERS TRIAL II  -7/20 S/P cardiac catheterization  Chest pain - ?CAD  -See acute on chronic diastolic CHF   Acute kidney failure on CKD Stage III -Normal saline 50 ml/hr   Possible HCAP -Cont Zosyn to complete 3 days of tx  - d/c Vanc as MRSA screen negative and no compelling evidence of acute infection   1 cm left upper lobe pulmonary nodule -Will need outpatient PET scan/further evaluation   COPD -Stable at present w/ no evidence of an acute exacerbation   Essential HTN -Patient hypertensive but in cardiac catheterization lab with cardiology.  -Per cardiology     DVT prophylaxis: Lovenox Code Status: Full Family Communication: None Disposition Plan: Per cardiology   Consultants:  Cardiology  Procedures/Significant Events:  7/20  cardiac catheterization; no stents placed    Cultures 7/17 MRSA by PCR negative  Antimicrobials: Vancomycin 7/18 >7/19 Zosyn 7/18 > >   Devices    LINES / TUBES:  None    Continuous Infusions: . sodium chloride Stopped (05/25/16 2000)  . sodium chloride 50 mL/hr at 05/25/16 2000  . nitroGLYCERIN 5 mcg/min (05/25/16 2000)     Subjective: 7/20 Patient on 2 occasions still in surgery  Objective: Filed Vitals:   05/26/16 0000 05/26/16 0300 05/26/16 0400 05/26/16 0500  BP: 142/63 142/63 155/73   Pulse: 74 76 80   Temp:  97.9 F (36.6 C)    TempSrc:  Oral    Resp: Height:      Weight:    55.883 kg (123 lb 3.2 oz)  SpO2: 93% 92% 91%     Intake/Output Summary (Last 24 hours) at 05/26/16 0819 Last data filed at 05/26/16 0600  Gross per 24 hour  Intake   2238 ml  Output   2205 ml  Net     33 ml   Filed Weights   05/24/16 2217 05/25/16 0500 05/26/16 0500  Weight: 54.885 kg (121 lb) 55.339 kg (122 lb) 55.883 kg (123 lb 3.2 oz)    Examination:  7/20 Patient on 2 occasions still in surgery No charge    Data Reviewed: Care during the described time interval was provided by me .  I have reviewed this patient's available data, including medical history, events of note,  physical examination, and all test results as part of my evaluation. I have personally reviewed and interpreted all radiology studies.  CBC:  Recent Labs Lab 05/23/16 1330  WBC 18.8*  NEUTROABS 15.6*  HGB 10.5*  HCT 33.3*  MCV 87.9  PLT 186   Basic Metabolic Panel:  Recent Labs Lab 05/23/16 1330 05/24/16 0458 05/25/16 0930 05/26/16 0354  NA 140 141 138 140  K 4.0 4.4 4.0 4.0  CL 109 105 105 108  CO2 23 26 24 24   GLUCOSE 127* 142* 125* 108*  BUN 27* 35* 46* 56*  CREATININE 1.34* 1.40* 1.88* 2.09*  CALCIUM 8.7* 8.4* 7.9* 7.4*   GFR: Estimated Creatinine Clearance: 16.2 mL/min (by C-G formula based on Cr of 2.09). Liver Function Tests: No results for input(s): AST,  ALT, ALKPHOS, BILITOT, PROT, ALBUMIN in the last 168 hours. No results for input(s): LIPASE, AMYLASE in the last 168 hours. No results for input(s): AMMONIA in the last 168 hours. Coagulation Profile:  Recent Labs Lab 05/23/16 1330  INR 0.98   Cardiac Enzymes:  Recent Labs Lab 05/23/16 1330 05/23/16 1727 05/23/16 2302 05/24/16 0458  TROPONINI 0.03* 0.03* 0.03* 0.03*   BNP (last 3 results) No results for input(s): PROBNP in the last 8760 hours. HbA1C: No results for input(s): HGBA1C in the last 72 hours. CBG: No results for input(s): GLUCAP in the last 168 hours. Lipid Profile: No results for input(s): CHOL, HDL, LDLCALC, TRIG, CHOLHDL, LDLDIRECT in the last 72 hours. Thyroid Function Tests: No results for input(s): TSH, T4TOTAL, FREET4, T3FREE, THYROIDAB in the last 72 hours. Anemia Panel: No results for input(s): VITAMINB12, FOLATE, FERRITIN, TIBC, IRON, RETICCTPCT in the last 72 hours. Urine analysis:    Component Value Date/Time   COLORURINE YELLOW 05/11/2016 2210   APPEARANCEUR CLEAR 05/11/2016 2210   LABSPEC 1.015 05/11/2016 2210   PHURINE 5.0 05/11/2016 2210   GLUCOSEU NEGATIVE 05/11/2016 2210   HGBUR NEGATIVE 05/11/2016 2210   BILIRUBINUR NEGATIVE 05/11/2016 2210   KETONESUR NEGATIVE 05/11/2016 2210   PROTEINUR 30* 05/11/2016 2210   UROBILINOGEN 0.2 04/22/2010 1413   NITRITE NEGATIVE 05/11/2016 2210   LEUKOCYTESUR TRACE* 05/11/2016 2210   Sepsis Labs: @LABRCNTIP (procalcitonin:4,lacticidven:4)  ) Recent Results (from the past 240 hour(s))  MRSA PCR Screening     Status: None   Collection Time: 05/23/16  6:40 PM  Result Value Ref Range Status   MRSA by PCR NEGATIVE NEGATIVE Final    Comment:        The GeneXpert MRSA Assay (FDA approved for NASAL specimens only), is one component of a comprehensive MRSA colonization surveillance program. It is not intended to diagnose MRSA infection nor to guide or monitor treatment for MRSA infections.           Radiology Studies: Dg Chest Port 1 View  05/24/2016  CLINICAL DATA:  Patient with respiratory failure. EXAM: PORTABLE CHEST 1 VIEW COMPARISON:  Chest radiograph 05/23/2016. FINDINGS: Multiple monitoring leads overlie the patient. Stable cardiomegaly with tortuosity and calcification of the thoracic aorta. Persistent bibasilar airspace opacities. Small bilateral pleural effusions. No pneumothorax. Known 1 cm left upper lobe nodule not well demonstrated on current evaluation. IMPRESSION: Unchanged bibasilar airspace opacities and small bilateral pleural effusions. Aortic vascular calcification. Electronically Signed   By: Annia Belt M.D.   On: 05/24/2016 09:52        Scheduled Meds: . allopurinol  100 mg Oral QHS  . amLODipine  10 mg Oral Daily  . antiseptic oral rinse  7 mL Mouth  Rinse q12n4p  . aspirin EC  81 mg Oral Daily  . atorvastatin  80 mg Oral q1800  . chlorhexidine  15 mL Mouth Rinse BID  . enoxaparin (LOVENOX) injection  30 mg Subcutaneous Q24H  . guaiFENesin  1,200 mg Oral BID  . hydrALAZINE  25 mg Oral Q8H  . ipratropium-albuterol  3 mL Nebulization BID  . Living Better with Heart Failure Book   Does not apply Once  . loratadine  10 mg Oral Daily  . metoprolol tartrate  25 mg Oral BID  . piperacillin-tazobactam (ZOSYN)  IV  3.375 g Intravenous Q8H  . sodium chloride flush  3 mL Intravenous Q12H  . sodium chloride flush  3 mL Intravenous Q12H  . sodium chloride flush  3 mL Intravenous Q12H   Continuous Infusions: . sodium chloride Stopped (05/25/16 2000)  . sodium chloride 50 mL/hr at 05/25/16 2000  . nitroGLYCERIN 5 mcg/min (05/25/16 2000)     LOS: 3 days    Time spent: 40 minutes    WOODS, Roselind MessierURTIS J, MD [facility] Pager [phone]   If 7PM-7AM, please contact night-coverage [email or web address] Password [email or web address] 05/26/2016, 8:19 AM

## 2016-05-26 NOTE — Research (Signed)
LEADERS FREE II Informed Consent   Subject Name: Sydney Jacobs  Subject met inclusion and exclusion criteria.  The informed consent form, study requirements and expectations were reviewed with the subject and questions and concerns were addressed prior to the signing of the consent form.  The subject verbalized understanding of the trail requirements.  The subject agreed to participate in the LEADERS FREE II trial and signed the informed consent.  The informed consent was obtained prior to performance of any protocol-specific procedures for the subject.  A copy of the signed informed consent was given to the subject and a copy was placed in the subject's medical record. If [name] meets angiographic criteria she would be a candidate for the LEADERS FREE II trial.  [name] 05/26/2016, 7:26 AM

## 2016-05-26 NOTE — Interval H&P Note (Signed)
Cath Lab Visit (complete for each Cath Lab visit)  Clinical Evaluation Leading to the Procedure:   ACS: Yes.    Non-ACS:    Anginal Classification: CCS IV  Anti-ischemic medical therapy: Minimal Therapy (1 class of medications)  Non-Invasive Test Results: No non-invasive testing performed  Prior CABG: No previous CABG      History and Physical Interval Note:  05/26/2016 3:02 PM  Sydney Jacobs  has presented today for surgery, with the diagnosis of cp  The various methods of treatment have been discussed with the patient and family. After consideration of risks, benefits and other options for treatment, the patient has consented to  Procedure(s): Left Heart Cath and Coronary Angiography (N/A) as a surgical intervention .  The patient's history has been reviewed, patient examined, no change in status, stable for surgery.  I have reviewed the patient's chart and labs.  Questions were answered to the patient's satisfaction.     Lance MussJayadeep Meagen Limones

## 2016-05-26 NOTE — Progress Notes (Signed)
   Subjective: Very SOB this AM  A litte better now  No CP   Objective: Filed Vitals:   05/26/16 0000 05/26/16 0300 05/26/16 0400 05/26/16 0500  BP: 142/63 142/63 155/73   Pulse: 74 76 80   Temp:  97.9 F (36.6 C)    TempSrc:  Oral    Resp: 12 14 17    Height:      Weight:    123 lb 3.2 oz (55.883 kg)  SpO2: 93% 92% 91%    Weight change: 2 lb 3.2 oz (0.998 kg)  Intake/Output Summary (Last 24 hours) at 05/26/16 0758 Last data filed at 05/26/16 0600  Gross per 24 hour  Intake   2241 ml  Output   2205 ml  Net     36 ml   Net I/O 3.13 L negative   General: Alert, awake, oriented x3, in no acute distress Neck:  JVP is normal Heart: Regular rate and rhythm, without murmurs, rubs, gallops.  Lungs: Clear to auscultation.  No rales or wheezes. Exemities:  No edema.   Neuro: Grossly intact, nonfocal.  Tele  SR   Lab Results: Results for orders placed or performed during the hospital encounter of 05/23/16 (from the past 24 hour(s))  Basic metabolic panel     Status: Abnormal   Collection Time: 05/25/16  9:30 AM  Result Value Ref Range   Sodium 138 135 - 145 mmol/L   Potassium 4.0 3.5 - 5.1 mmol/L   Chloride 105 101 - 111 mmol/L   CO2 24 22 - 32 mmol/L   Glucose, Bld 125 (H) 65 - 99 mg/dL   BUN 46 (H) 6 - 20 mg/dL   Creatinine, Ser 2.951.88 (H) 0.44 - 1.00 mg/dL   Calcium 7.9 (L) 8.9 - 10.3 mg/dL   GFR calc non Af Amer 24 (L) >60 mL/min   GFR calc Af Amer 28 (L) >60 mL/min   Anion gap 9 5 - 15  Basic metabolic panel     Status: Abnormal   Collection Time: 05/26/16  3:54 AM  Result Value Ref Range   Sodium 140 135 - 145 mmol/L   Potassium 4.0 3.5 - 5.1 mmol/L   Chloride 108 101 - 111 mmol/L   CO2 24 22 - 32 mmol/L   Glucose, Bld 108 (H) 65 - 99 mg/dL   BUN 56 (H) 6 - 20 mg/dL   Creatinine, Ser 6.212.09 (H) 0.44 - 1.00 mg/dL   Calcium 7.4 (L) 8.9 - 10.3 mg/dL   GFR calc non Af Amer 21 (L) >60 mL/min   GFR calc Af Amer 25 (L) >60 mL/min   Anion gap 8 5 - 15     Studies/Results: No results found.  Medications:Reviewed   @PROBHOSP @  1  Acute on chronic diastolic CHF  Spell this AM  Beter now  Continue meds  Have held lasix with renal function declinie Discussed cath with J Varanasi  Pt needs to be able to lay flat  2.  HTN  Continue mesd   3.  Renal  Unfort with hydration Cr hs continued to bump up  Will repeat at noon      LOS: 3 days   Sydney Jacobs 05/26/2016, 7:58 AM

## 2016-05-26 NOTE — Progress Notes (Signed)
Site area: rt groin Site Prior to Removal:  Level 0 Pressure Applied For:  25 minutes Manual:   yes Patient Status During Pull:  stable Post Pull Site:  Level  0 Post Pull Instructions Given:  yes Post Pull Pulses Present: yes Dressing Applied:  tegaderm Bedrest begins @  1615 Comments:

## 2016-05-26 NOTE — H&P (View-Only) (Signed)
   Subjective: Very SOB this AM  A litte better now  No CP   Objective: Filed Vitals:   05/26/16 0000 05/26/16 0300 05/26/16 0400 05/26/16 0500  BP: 142/63 142/63 155/73   Pulse: 74 76 80   Temp:  97.9 F (36.6 C)    TempSrc:  Oral    Resp: 12 14 17   Height:      Weight:    123 lb 3.2 oz (55.883 kg)  SpO2: 93% 92% 91%    Weight change: 2 lb 3.2 oz (0.998 kg)  Intake/Output Summary (Last 24 hours) at 05/26/16 0758 Last data filed at 05/26/16 0600  Gross per 24 hour  Intake   2241 ml  Output   2205 ml  Net     36 ml   Net I/O 3.13 L negative   General: Alert, awake, oriented x3, in no acute distress Neck:  JVP is normal Heart: Regular rate and rhythm, without murmurs, rubs, gallops.  Lungs: Clear to auscultation.  No rales or wheezes. Exemities:  No edema.   Neuro: Grossly intact, nonfocal.  Tele  SR   Lab Results: Results for orders placed or performed during the hospital encounter of 05/23/16 (from the past 24 hour(s))  Basic metabolic panel     Status: Abnormal   Collection Time: 05/25/16  9:30 AM  Result Value Ref Range   Sodium 138 135 - 145 mmol/L   Potassium 4.0 3.5 - 5.1 mmol/L   Chloride 105 101 - 111 mmol/L   CO2 24 22 - 32 mmol/L   Glucose, Bld 125 (H) 65 - 99 mg/dL   BUN 46 (H) 6 - 20 mg/dL   Creatinine, Ser 1.88 (H) 0.44 - 1.00 mg/dL   Calcium 7.9 (L) 8.9 - 10.3 mg/dL   GFR calc non Af Amer 24 (L) >60 mL/min   GFR calc Af Amer 28 (L) >60 mL/min   Anion gap 9 5 - 15  Basic metabolic panel     Status: Abnormal   Collection Time: 05/26/16  3:54 AM  Result Value Ref Range   Sodium 140 135 - 145 mmol/L   Potassium 4.0 3.5 - 5.1 mmol/L   Chloride 108 101 - 111 mmol/L   CO2 24 22 - 32 mmol/L   Glucose, Bld 108 (H) 65 - 99 mg/dL   BUN 56 (H) 6 - 20 mg/dL   Creatinine, Ser 2.09 (H) 0.44 - 1.00 mg/dL   Calcium 7.4 (L) 8.9 - 10.3 mg/dL   GFR calc non Af Amer 21 (L) >60 mL/min   GFR calc Af Amer 25 (L) >60 mL/min   Anion gap 8 5 - 15     Studies/Results: No results found.  Medications:Reviewed   @PROBHOSP@  1  Acute on chronic diastolic CHF  Spell this AM  Beter now  Continue meds  Have held lasix with renal function declinie Discussed cath with J Varanasi  Pt needs to be able to lay flat  2.  HTN  Continue mesd   3.  Renal  Unfort with hydration Cr hs continued to bump up  Will repeat at noon      LOS: 3 days   Sylis Ketchum 05/26/2016, 7:58 AM   

## 2016-05-27 ENCOUNTER — Encounter (HOSPITAL_COMMUNITY): Payer: Self-pay | Admitting: Interventional Cardiology

## 2016-05-27 MED ORDER — METOPROLOL TARTRATE 25 MG PO TABS
25.0000 mg | ORAL_TABLET | Freq: Four times a day (QID) | ORAL | Status: DC
  Administered 2016-05-27 – 2016-05-29 (×8): 25 mg via ORAL
  Filled 2016-05-27 (×8): qty 1

## 2016-05-27 MED ORDER — ALBUTEROL SULFATE (2.5 MG/3ML) 0.083% IN NEBU: 2.5000 mg | INHALATION_SOLUTION | RESPIRATORY_TRACT | Status: DC | PRN

## 2016-05-27 MED ORDER — GUAIFENESIN ER 600 MG PO TB12: 1200.0000 mg | ORAL_TABLET | Freq: Two times a day (BID) | ORAL | Status: DC | PRN

## 2016-05-27 MED ORDER — HYDRALAZINE HCL 25 MG PO TABS
25.0000 mg | ORAL_TABLET | Freq: Three times a day (TID) | ORAL | Status: DC
Start: 2016-05-27 — End: 2016-05-29
  Administered 2016-05-27 – 2016-05-29 (×6): 25 mg via ORAL
  Filled 2016-05-27 (×6): qty 1

## 2016-05-27 MED ORDER — HYDRALAZINE HCL 50 MG PO TABS: 50.0000 mg | ORAL_TABLET | Freq: Three times a day (TID) | ORAL | Status: DC

## 2016-05-27 NOTE — Progress Notes (Signed)
[facility] TEAM 1 - Stepdown/ICU TEAM  Tandy Gawlsie H Carusone  ZOX:[id number] DOB: 1934/05/12 DOA: 05/23/2016 PCP: Colette RibasGOLDING, JOHN CABOT, MD    Brief Narrative:  82 yo f with history of diastolic CHF, CKD Stage III, and COPD presented with shortness of breath, significant volume overload, and mild chest pain secondary to cough. In the ED, she was noted to be in respiratory distress for which she was started on BiPAP with improvement. CXR with evidence of pleural effusion and BNP noted to be higher than prior admission.  Assessment & Plan:   Acute respiratory failure with hypoxia due to Acute on chronic diastolic CHF TTE 8/1/197/3/17 w/ normal EF and grade 1 DD - not on ACE-I due to renal function - net negative ~4.8L since admit - attempts to diurese continue -  Hypoxia persists to extent that seems beyond her current volume status - may require further w/u if O2 demands do not improve soon  Filed Weights   05/25/16 0500 05/26/16 0500 05/27/16 0354  Weight: 55.339 kg (122 lb) 55.883 kg (123 lb 3.2 oz) 53.57 kg (118 lb 1.6 oz)    Chest pain  cardiac cath 7/20 noted only mild CAD - no more chest pain at this time   Acute kidney failure on CKD Stage III hold ACEi/ARB - crt 1.34 - 2.16 earlier this month during admission - crt appears to have stabilized for now - follow as pt post-cath   Recent Labs Lab 05/23/16 1330 05/24/16 0458 05/25/16 0930 05/26/16 0354 05/26/16 1317  CREATININE 1.34* 1.40* 1.88* 2.09* 1.91*    Possible HCAP completed 3 days of zosyn - afeb w/ normal WBC -  Recheck CXR in AM   1 cm left upper lobe pulmonary nodule Will need outpatient PET scan/further evaluation   COPD No wheezing at present - cont nebs - attempt to wean O2 as discussed above   Essential HTN BP not at goal - med tx being adjusted today    DVT prophylaxis: Lovenox Code Status: Full Family Communication: No family present at time of exam today Disposition Plan: Transfer to telemetry bed - wean oxygen  as able - titrate blood pressure medications - PT/OT  Consultants:  Pulmonology Cardiology  Procedures:  none  Antimicrobials:  Vancomycin 7/18  Zosyn 7/18 > 7/20   Subjective: Resting comfortably in bed.  She denies chest pain nausea vomiting or abdominal pain.  She denies dyspnea at rest but does admit that with even the slightest exertion she rapidly become short of breath.  Objective: Filed Vitals:   05/27/16 1100 05/27/16 1200 05/27/16 1215 05/27/16 1230  BP: 149/61 152/64    Pulse: 77 75 78 73  Temp:  98.4 F (36.9 C)    TempSrc:  Oral    Resp: 19 18 22 17   Height:      Weight:      SpO2: 98% 93% 88% 93%    Intake/Output Summary (Last 24 hours) at 05/27/16 1422 Last data filed at 05/27/16 1400  Gross per 24 hour  Intake    715 ml  Output   2160 ml  Net  -1445 ml   Filed Weights   05/25/16 0500 05/26/16 0500 05/27/16 0354  Weight: 55.339 kg (122 lb) 55.883 kg (123 lb 3.2 oz) 53.57 kg (118 lb 1.6 oz)    Examination: General: No acute respiratory distress at rest Lungs: Poor air movement bilateral bases - good air movement throughout other fields with no wheeze Cardiovascular: Regular rate and rhythm without murmur  gallop or rub normal S1 and S2 Abdomen: Nontender, nondistended, soft, bowel sounds positive, no rebound Extremities: No significant cyanosis, clubbing, edema bilateral lower extremities  CBC:  Recent Labs Lab 05/23/16 1330  WBC 18.8*  NEUTROABS 15.6*  HGB 10.5*  HCT 33.3*  MCV 87.9  PLT 186   Basic Metabolic Panel:  Recent Labs Lab 05/23/16 1330 05/24/16 0458 05/25/16 0930 05/26/16 0354 05/26/16 1317  NA 140 141 138 140 142  K 4.0 4.4 4.0 4.0 3.8  CL 109 105 105 108 108  CO2 GLUCOSE 127* 142* 125* 108* 98  BUN 27* 35* 46* 56* 49*  CREATININE 1.34* 1.40* 1.88* 2.09* 1.91*  CALCIUM 8.7* 8.4* 7.9* 7.4* 8.0*   GFR: Estimated Creatinine Clearance: 17.3 mL/min (by C-G formula based on Cr of 1.91).   Liver  Function Tests: No results for input(s): AST, ALT, ALKPHOS, BILITOT, PROT, ALBUMIN in the last 168 hours. No results for input(s): LIPASE, AMYLASE in the last 168 hours. No results for input(s): AMMONIA in the last 168 hours.   Coagulation Profile:  Recent Labs Lab 05/23/16 1330  INR 0.98   Cardiac Enzymes:  Recent Labs Lab 05/23/16 1330 05/23/16 1727 05/23/16 2302 05/24/16 0458  TROPONINI 0.03* 0.03* 0.03* 0.03*    Recent Results (from the past 240 hour(s))  MRSA PCR Screening     Status: None   Collection Time: 05/23/16  6:40 PM  Result Value Ref Range Status   MRSA by PCR NEGATIVE NEGATIVE Final    Comment:        The GeneXpert MRSA Assay (FDA approved for NASAL specimens only), is one component of a comprehensive MRSA colonization surveillance program. It is not intended to diagnose MRSA infection nor to guide or monitor treatment for MRSA infections.     Scheduled Meds: . allopurinol  100 mg Oral QHS  . amLODipine  10 mg Oral Daily  . antiseptic oral rinse  7 mL Mouth Rinse q12n4p  . aspirin EC  81 mg Oral Daily  . atorvastatin  80 mg Oral q1800  . chlorhexidine  15 mL Mouth Rinse BID  . enoxaparin (LOVENOX) injection  30 mg Subcutaneous Q24H  . guaiFENesin  1,200 mg Oral BID  . hydrALAZINE  25 mg Oral Q8H  . Living Better with Heart Failure Book   Does not apply Once  . loratadine  10 mg Oral Daily  . metoprolol tartrate  25 mg Oral QID  . sodium chloride flush  3 mL Intravenous Q12H  . sodium chloride flush  3 mL Intravenous Q12H     LOS: 4 days    Lonia Blood, MD [facility] Office  [phone] Pager - Text Page per Loretha Stapler as per below:  On-Call/Text Page:      [email or web address]      password [email or web address]  If 7PM-7AM, please contact night-coverage [email or web address] Password [email or web address] 05/27/2016, 2:22 PM

## 2016-05-27 NOTE — Progress Notes (Signed)
Transferred to 3east room17 by wheelchair, stable, report given to RN, belongings with patient.

## 2016-05-27 NOTE — Progress Notes (Signed)
   Subjective: Breathign a little better  Still gives out  No CP   Objective: Filed Vitals:   05/27/16 0400 05/27/16 0500 05/27/16 0600 05/27/16 0700  BP: 150/64 147/62 152/68 154/60  Pulse: 70 67 72 71  Temp:      TempSrc:      Resp: 15 13 18 16   Height:      Weight:      SpO2: 97% 97% 97% 97%   Weight change: -5 lb 1.6 oz (-2.313 kg)  Intake/Output Summary (Last 24 hours) at 05/27/16 0943 Last data filed at 05/27/16 0900  Gross per 24 hour  Intake  722.5 ml  Output   2510 ml  Net -1787.5 ml   Net neg 4.9 L     General: Alert, awake, oriented x3, in no acute distress Neck:  JVP is normal Heart: Regular rate and rhythm, without murmurs, rubs, gallops.  Lungs: Clear to auscultation.  No rales or wheezes. Exemities:  No edema.   Neuro: Grossly intact, nonfocal.  Tele:  SR  Lab Results: Results for orders placed or performed during the hospital encounter of 05/23/16 (from the past 24 hour(s))  Basic metabolic panel     Status: Abnormal   Collection Time: 05/26/16  1:17 PM  Result Value Ref Range   Sodium 142 135 - 145 mmol/L   Potassium 3.8 3.5 - 5.1 mmol/L   Chloride 108 101 - 111 mmol/L   CO2 26 22 - 32 mmol/L   Glucose, Bld 98 65 - 99 mg/dL   BUN 49 (H) 6 - 20 mg/dL   Creatinine, Ser 1.021.91 (H) 0.44 - 1.00 mg/dL   Calcium 8.0 (L) 8.9 - 10.3 mg/dL   GFR calc non Af Amer 24 (L) >60 mL/min   GFR calc Af Amer 28 (L) >60 mL/min   Anion gap 8 5 - 15    Studies/Results: No results found.  Medications: Reviewed   @PROBHOSP @  1  Acute on chronic diastolic CHF   Cath yesterday showed just mild CAD  I have reviewed echo from [facility]  LVEF normal  Mild diastolic dysfunction   I still am confused about why she is SO sensitive to volume   ? BP    Will increase meds for better control She desaturated today with moving  WIll give additional dose of lasix  Watch Cr  2  HTN  As noted  Very diffiuclt    Will set up for renal artery USN  3  CAD  Mild at cath  Continue  statin  4  HL  Keep on statin    5  CV dz  Will need USN of carodites  It has been almost 10 years      LOS: 4 days   Dietrich Patesaula Lani Havlik 05/27/2016, 9:43 AM

## 2016-05-27 NOTE — Progress Notes (Signed)
Offered to help her sit in a chair and claimed to have it done later.

## 2016-05-27 NOTE — Progress Notes (Signed)
desats to 80's on room air. Placed back on o2 3l [place] . Continue to monitor.

## 2016-05-27 NOTE — Care Management Important Message (Signed)
Important Message  Patient Details  Name: Sydney Jacobs MRN: [id number] Date of Birth: 1934-03-16   Medicare Important Message Given:  Yes    Bernadette HoitShoffner, Assata Juncaj Coleman 05/27/2016, 8:16 AM

## 2016-05-28 ENCOUNTER — Inpatient Hospital Stay (HOSPITAL_COMMUNITY): Payer: Commercial Managed Care - HMO

## 2016-05-28 LAB — COMPREHENSIVE METABOLIC PANEL
ALT: 32 U/L (ref 14–54)
AST: 40 U/L (ref 15–41)
Albumin: 2.4 g/dL — ABNORMAL LOW (ref 3.5–5.0)
Alkaline Phosphatase: 38 U/L (ref 38–126)
Anion gap: 7 (ref 5–15)
BILIRUBIN TOTAL: 0.4 mg/dL (ref 0.3–1.2)
BUN: 49 mg/dL — ABNORMAL HIGH (ref 6–20)
CALCIUM: 7.6 mg/dL — AB (ref 8.9–10.3)
CO2: 28 mmol/L (ref 22–32)
CREATININE: 1.75 mg/dL — AB (ref 0.44–1.00)
Chloride: 107 mmol/L (ref 101–111)
GFR calc Af Amer: 31 mL/min — ABNORMAL LOW (ref >60)
GFR calc non Af Amer: 27 mL/min — ABNORMAL LOW (ref >60)
Glucose, Bld: 97 mg/dL (ref 65–99)
Potassium: 3.5 mmol/L (ref 3.5–5.1)
Sodium: 142 mmol/L (ref 135–145)
Total Protein: 4.2 g/dL — ABNORMAL LOW (ref 6.5–8.1)

## 2016-05-28 LAB — CBC
HCT: 21.4 % — ABNORMAL LOW (ref 36.0–46.0)
HCT: 21.9 % — ABNORMAL LOW (ref 36.0–46.0)
HEMOGLOBIN: 6.4 g/dL — AB (ref 12.0–15.0)
HEMOGLOBIN: 6.8 g/dL — AB (ref 12.0–15.0)
MCH: 27.4 pg (ref 26.0–34.0)
MCH: 27.5 pg (ref 26.0–34.0)
MCHC: 30.8 g/dL (ref 30.0–36.0)
MCHC: 31.1 g/dL (ref 30.0–36.0)
MCV: 88.8 fL (ref 78.0–100.0)
MCV: 88.7 fL (ref 78.0–100.0)
PLATELETS: 129 10*3/uL — AB (ref 150–400)
PLATELETS: 129 10*3/uL — AB (ref 150–400)
RBC: 2.41 MIL/uL — AB (ref 3.87–5.11)
RBC: 2.47 MIL/uL — ABNORMAL LOW (ref 3.87–5.11)
RDW: 17 % — ABNORMAL HIGH (ref 11.5–15.5)
RDW: 16.9 % — AB (ref 11.5–15.5)
WBC: 7.3 10*3/uL (ref 4.0–10.5)
WBC: 6.5 10*3/uL (ref 4.0–10.5)

## 2016-05-28 LAB — PREPARE RBC (CROSSMATCH)

## 2016-05-28 LAB — OCCULT BLOOD X 1 CARD TO LAB, STOOL: FECAL OCCULT BLD: NEGATIVE

## 2016-05-28 MED ORDER — SODIUM CHLORIDE 0.9 % IV SOLN: Freq: Once | INTRAVENOUS | Status: DC

## 2016-05-28 MED ORDER — FUROSEMIDE 10 MG/ML IJ SOLN
60.0000 mg | Freq: Once | INTRAMUSCULAR | Status: AC
  Administered 2016-05-28: 60 mg via INTRAVENOUS
  Filled 2016-05-28: qty 6

## 2016-05-28 NOTE — Progress Notes (Signed)
Sydney Jacobs  EAV:[id number] DOB: 22-Dec-1933 DOA: 05/23/2016 PCP: Colette Ribas, MD    Brief Narrative:  82 yo f with history of diastolic CHF, CKD Stage III, and COPD presented with shortness of breath, significant volume overload, and mild chest pain secondary to cough. In the ED, she was noted to be in respiratory distress for which she was started on BiPAP with improvement. CXR with evidence of pleural effusion and BNP noted to be higher than prior admission.  Assessment & Plan:   Acute respiratory failure with hypoxia due to Acute on chronic diastolic CHF TTE 05/14/28 w/ normal EF and grade 1 DD - not on ACE-I due to renal function - net negative ~5 L since admit - attempts to diurese continue -  Hypoxia persists to extent that seems beyond her current volume status - may require further w/u if O2 demands do not improve soon  Filed Weights   05/26/16 0500 05/27/16 0354 05/28/16 0527  Weight: 55.883 kg (123 lb 3.2 oz) 53.57 kg (118 lb 1.6 oz) 53.615 kg (118 lb 3.2 oz)    Chest pain  cardiac cath 7/20 noted only mild CAD - no more chest pain at this time   Acute kidney failure on CKD Stage III hold ACEi/ARB - crt 1.34 - 2.16 earlier this month during admission - crt appears to have stabilized for now - follow as pt post-cath   Recent Labs Lab 05/24/16 0458 05/25/16 0930 05/26/16 0354 05/26/16 1317 05/28/16 0329  CREATININE 1.40* 1.88* 2.09* 1.91* 1.75*    Possible HCAP completed 3 days of zosyn - afeb w/ normal WBC -  Recheck CXR in AM   1 cm left upper lobe pulmonary nodule Will need outpatient PET scan/further evaluation   COPD No wheezing at present - cont nebs - attempt to wean O2 as discussed above   Essential HTN BP not at goal - med tx being adjusted today   Anemia - hemoglobin 6.4 this a.m., has significant abdominal bruising, will hold Lovenox, aspirin, will check CT abdomen and pelvis to rule out abdominal hematoma, Hemoccult negative, will receive 2  units PRBC today.   DVT prophylaxis: SCD Code Status: Full Family Communication: Husband in both sons at bedside Disposition Plan: Will need PT/OT evaluation  Consultants:  Pulmonology Cardiology  Procedures:  none  Antimicrobials:  Vancomycin 7/18  Zosyn 7/18 > 7/20   Subjective: Resting comfortably in bed.  She denies chest pain nausea vomiting or abdominal pain.  She denies dyspnea at rest. Objective: Filed Vitals:   05/28/16 1056 05/28/16 1130 05/28/16 1400 05/28/16 1500  BP: 159/50 151/54 163/61 153/66  Pulse: 84 77 84 89  Temp: 98.2 F (36.8 C) 98.5 F (36.9 C) 99.1 F (37.3 C) 98.3 F (36.8 C)  TempSrc: Oral Oral Oral Oral  Resp: Height:      Weight:      SpO2: 95% 97% 94% 90%    Intake/Output Summary (Last 24 hours) at 05/28/16 1504 Last data filed at 05/28/16 1300  Gross per 24 hour  Intake    865 ml  Output   1100 ml  Net   -235 ml   Filed Weights   05/26/16 0500 05/27/16 0354 05/28/16 0527  Weight: 55.883 kg (123 lb 3.2 oz) 53.57 kg (118 lb 1.6 oz) 53.615 kg (118 lb 3.2 oz)    Examination: General: No acute respiratory distress at rest Lungs: Poor air movement bilateral bases - good air movement throughout  other fields with no wheeze Cardiovascular: Regular rate and rhythm without murmur gallop or rub normal S1 and S2 Abdomen: Nontender, nondistended, soft, bowel sounds positive, no rebound, Significant bruising in the morning including right, left, and hypogastric area. Extremities: No significant cyanosis, clubbing, edema bilateral lower extremities  CBC:  Recent Labs Lab 05/23/16 1330 05/28/16 0329 05/28/16 0747  WBC 18.8* 7.3 6.5  NEUTROABS 15.6*  --   --   HGB 10.5* 6.4* 6.8*  HCT 33.3* 21.4* 21.9*  MCV 87.9 88.8 88.7  PLT 186 129* 129*   Basic Metabolic Panel:  Recent Labs Lab 05/24/16 0458 05/25/16 0930 05/26/16 0354 05/26/16 1317 05/28/16 0329  NA 141 138 140 142 142  K 4.4 4.0 4.0 3.8 3.5  CL 105 105 108  108 107  CO2 26 24 24 26 28   GLUCOSE 142* 125* 108* 98 97  BUN 35* 46* 56* 49* 49*  CREATININE 1.40* 1.88* 2.09* 1.91* 1.75*  CALCIUM 8.4* 7.9* 7.4* 8.0* 7.6*   GFR: Estimated Creatinine Clearance: 18.9 mL/min (by C-G formula based on Cr of 1.75).   Liver Function Tests:  Recent Labs Lab 05/28/16 0329  AST 40  ALT 32  ALKPHOS 38  BILITOT 0.4  PROT 4.2*  ALBUMIN 2.4*   No results for input(s): LIPASE, AMYLASE in the last 168 hours. No results for input(s): AMMONIA in the last 168 hours.   Coagulation Profile:  Recent Labs Lab 05/23/16 1330  INR 0.98   Cardiac Enzymes:  Recent Labs Lab 05/23/16 1330 05/23/16 1727 05/23/16 2302 05/24/16 0458  TROPONINI 0.03* 0.03* 0.03* 0.03*    Recent Results (from the past 240 hour(s))  MRSA PCR Screening     Status: None   Collection Time: 05/23/16  6:40 PM  Result Value Ref Range Status   MRSA by PCR NEGATIVE NEGATIVE Final    Comment:        The GeneXpert MRSA Assay (FDA approved for NASAL specimens only), is one component of a comprehensive MRSA colonization surveillance program. It is not intended to diagnose MRSA infection nor to guide or monitor treatment for MRSA infections.     Scheduled Meds: . sodium chloride   Intravenous Once  . allopurinol  100 mg Oral QHS  . amLODipine  10 mg Oral Daily  . antiseptic oral rinse  7 mL Mouth Rinse q12n4p  . atorvastatin  80 mg Oral q1800  . chlorhexidine  15 mL Mouth Rinse BID  . hydrALAZINE  25 mg Oral Q8H  . Living Better with Heart Failure Book   Does not apply Once  . loratadine  10 mg Oral Daily  . metoprolol tartrate  25 mg Oral QID     LOS: 5 days    Huey Bienenstock MD [phone] [facility] Office  [phone] Pager - Text Page per Amion as per below:  On-Call/Text Page:      [email or web address]      password [email or web address]  If 7PM-7AM, please contact night-coverage [email or web address] Password [facility] 05/28/2016, 3:04 PM

## 2016-05-28 NOTE — Progress Notes (Signed)
Patient's two units of blood transfused, patient tolerated well, patient is stable but continues to have some SOB, patient is currently on 3L [place]  Will continue to monitor.

## 2016-05-29 DIAGNOSIS — D6489 Other specified anemias: Secondary | ICD-10-CM

## 2016-05-29 DIAGNOSIS — K661 Hemoperitoneum: Secondary | ICD-10-CM

## 2016-05-29 DIAGNOSIS — L8990 Pressure ulcer of unspecified site, unspecified stage: Secondary | ICD-10-CM | POA: Insufficient documentation

## 2016-05-29 DIAGNOSIS — D649 Anemia, unspecified: Secondary | ICD-10-CM

## 2016-05-29 DIAGNOSIS — J441 Chronic obstructive pulmonary disease with (acute) exacerbation: Secondary | ICD-10-CM

## 2016-05-29 DIAGNOSIS — I2510 Atherosclerotic heart disease of native coronary artery without angina pectoris: Secondary | ICD-10-CM

## 2016-05-29 DIAGNOSIS — E785 Hyperlipidemia, unspecified: Secondary | ICD-10-CM

## 2016-05-29 DIAGNOSIS — I779 Disorder of arteries and arterioles, unspecified: Secondary | ICD-10-CM

## 2016-05-29 LAB — TYPE AND SCREEN
ABO/RH(D): B POS
Antibody Screen: NEGATIVE
UNIT DIVISION: 0
UNIT DIVISION: 0

## 2016-05-29 LAB — BASIC METABOLIC PANEL
ANION GAP: 4 — AB (ref 5–15)
BUN: 39 mg/dL — AB (ref 6–20)
CHLORIDE: 108 mmol/L (ref 101–111)
CO2: 29 mmol/L (ref 22–32)
CREATININE: 1.41 mg/dL — AB (ref 0.44–1.00)
Calcium: 7.8 mg/dL — ABNORMAL LOW (ref 8.9–10.3)
GFR calc non Af Amer: 34 mL/min — ABNORMAL LOW (ref >60)
GFR, EST AFRICAN AMERICAN: 40 mL/min — AB (ref >60)
Glucose, Bld: 106 mg/dL — ABNORMAL HIGH (ref 65–99)
POTASSIUM: 3.4 mmol/L — AB (ref 3.5–5.1)
Sodium: 141 mmol/L (ref 135–145)

## 2016-05-29 LAB — CBC
HEMATOCRIT: 29.7 % — AB (ref 36.0–46.0)
Hemoglobin: 9.6 g/dL — ABNORMAL LOW (ref 12.0–15.0)
MCH: 27.7 pg (ref 26.0–34.0)
MCHC: 32.3 g/dL (ref 30.0–36.0)
MCV: 85.6 fL (ref 78.0–100.0)
PLATELETS: 123 10*3/uL — AB (ref 150–400)
RBC: 3.47 MIL/uL — ABNORMAL LOW (ref 3.87–5.11)
RDW: 17.3 % — AB (ref 11.5–15.5)
WBC: 7.3 10*3/uL (ref 4.0–10.5)

## 2016-05-29 MED ORDER — PANTOPRAZOLE SODIUM 40 MG PO TBEC
40.0000 mg | DELAYED_RELEASE_TABLET | Freq: Every day | ORAL | Status: DC
  Administered 2016-05-29 – 2016-06-03 (×6): 40 mg via ORAL
  Filled 2016-05-29 (×6): qty 1

## 2016-05-29 MED ORDER — METHYLPREDNISOLONE SODIUM SUCC 125 MG IJ SOLR
60.0000 mg | Freq: Two times a day (BID) | INTRAMUSCULAR | Status: DC
  Administered 2016-05-29 – 2016-05-30 (×2): 60 mg via INTRAVENOUS
  Filled 2016-05-29 (×2): qty 2

## 2016-05-29 MED ORDER — FUROSEMIDE 10 MG/ML IJ SOLN
40.0000 mg | Freq: Two times a day (BID) | INTRAMUSCULAR | Status: DC
  Administered 2016-05-29 (×2): 40 mg via INTRAVENOUS
  Filled 2016-05-29 (×3): qty 4

## 2016-05-29 MED ORDER — METOPROLOL TARTRATE 50 MG PO TABS
50.0000 mg | ORAL_TABLET | Freq: Two times a day (BID) | ORAL | Status: DC
  Administered 2016-05-29 – 2016-06-03 (×10): 50 mg via ORAL
  Filled 2016-05-29 (×10): qty 1

## 2016-05-29 MED ORDER — HYDRALAZINE HCL 50 MG PO TABS
50.0000 mg | ORAL_TABLET | Freq: Three times a day (TID) | ORAL | Status: DC
  Administered 2016-05-29 – 2016-05-31 (×6): 50 mg via ORAL
  Filled 2016-05-29 (×6): qty 1

## 2016-05-29 NOTE — Progress Notes (Signed)
SUBJECTIVE: Feeling better after transfusion. Denies CP/SOB. Mild right groin pain/tenderness.  ROS: Other than pertinent positives in "Subjective", all others were reviewed and found to be negative.   Intake/Output Summary (Last 24 hours) at 05/29/16 0946 Last data filed at 05/29/16 0600  Gross per 24 hour  Intake             1242 ml  Output             1401 ml  Net             -159 ml    Current Facility-Administered Medications  Medication Dose Route Frequency Provider Last Rate Last Dose  . 0.9 %  sodium chloride infusion   Intravenous Once Jinger Neighbors, NP      . acetaminophen (TYLENOL) tablet 650 mg  650 mg Oral Q4H PRN Erick Blinks, MD      . albuterol (PROVENTIL) (2.5 MG/3ML) 0.083% nebulizer solution 2.5 mg  2.5 mg Nebulization Q2H PRN Lonia Blood, MD      . allopurinol (ZYLOPRIM) tablet 100 mg  100 mg Oral QHS Erick Blinks, MD   100 mg at 05/28/16 2244  . amLODipine (NORVASC) tablet 10 mg  10 mg Oral Daily Erick Blinks, MD   10 mg at 05/28/16 0917  . antiseptic oral rinse (CPC / CETYLPYRIDINIUM CHLORIDE 0.05%) solution 7 mL  7 mL Mouth Rinse q12n4p Erick Blinks, MD   7 mL at 05/27/16 1800  . atorvastatin (LIPITOR) tablet 80 mg  80 mg Oral q1800 Jonelle Sidle, MD   80 mg at 05/28/16 1805  . chlorhexidine (PERIDEX) 0.12 % solution 15 mL  15 mL Mouth Rinse BID Erick Blinks, MD   15 mL at 05/28/16 0917  . furosemide (LASIX) injection 40 mg  40 mg Intravenous BID Starleen Arms, MD   40 mg at 05/29/16 0843  . guaiFENesin (MUCINEX) 12 hr tablet 1,200 mg  1,200 mg Oral BID PRN Lonia Blood, MD      . hydrALAZINE (APRESOLINE) tablet 25 mg  25 mg Oral Q8H Pricilla Riffle, MD   25 mg at 05/29/16 0542  . Living Better with Heart Failure Book   Does not apply Once Erick Blinks, MD      . loratadine (CLARITIN) tablet 10 mg  10 mg Oral Daily Erick Blinks, MD   10 mg at 05/28/16 0917  . metoprolol tartrate (LOPRESSOR) tablet 25 mg  25 mg Oral QID Pricilla Riffle, MD   25 mg at 05/28/16 2244  . ondansetron (ZOFRAN) injection 4 mg  4 mg Intravenous Q6H PRN Erick Blinks, MD   4 mg at 05/26/16 1724    Vitals:   05/28/16 2006 05/29/16 0120 05/29/16 0500 05/29/16 0632  BP: (!) 139/56 (!) 160/56 (!) 155/86   Pulse: 82 73 79   Resp: Temp: 98.2 F (36.8 C) 98.2 F (36.8 C) 98 F (36.7 C)   TempSrc: Oral Oral Oral   SpO2: 96% 96% 96%   Weight:    116 lb 6.4 oz (52.8 kg)  Height:        PHYSICAL EXAM General: NAD HEENT: Normal. Neck: No JVD, no thyromegaly.  Lungs: Clear to auscultation bilaterally with normal respiratory effort. CV: Nondisplaced PMI.  Regular rate and rhythm, normal S1/S2, no S3/S4, no murmur.  No pretibial edema. Abdomen: Soft, mild right groin tenderness/ecchymoses Neurologic: Alert and oriented x 3.  Psych: Normal  affect.   LABS: Basic Metabolic Panel:  Recent Labs  46/50/35 0329 05/29/16 0307  NA 142 141  K 3.5 3.4*  CL 107 108  CO2 28 29  GLUCOSE 97 106*  BUN 49* 39*  CREATININE 1.75* 1.41*  CALCIUM 7.6* 7.8*   Liver Function Tests:  Recent Labs  05/28/16 0329  AST 40  ALT 32  ALKPHOS 38  BILITOT 0.4  PROT 4.2*  ALBUMIN 2.4*   No results for input(s): LIPASE, AMYLASE in the last 72 hours. CBC:  Recent Labs  05/28/16 0747 05/29/16 0307  WBC 6.5 7.3  HGB 6.8* 9.6*  HCT 21.9* 29.7*  MCV 88.7 85.6  PLT 129* 123*   Cardiac Enzymes: No results for input(s): CKTOTAL, CKMB, CKMBINDEX, TROPONINI in the last 72 hours. BNP: Invalid input(s): POCBNP D-Dimer: No results for input(s): DDIMER in the last 72 hours. Hemoglobin A1C: No results for input(s): HGBA1C in the last 72 hours. Fasting Lipid Panel: No results for input(s): CHOL, HDL, LDLCALC, TRIG, CHOLHDL, LDLDIRECT in the last 72 hours. Thyroid Function Tests: No results for input(s): TSH, T4TOTAL, T3FREE, THYROIDAB in the last 72 hours.  Invalid input(s): FREET3 Anemia Panel: No results for input(s): VITAMINB12,  FOLATE, FERRITIN, TIBC, IRON, RETICCTPCT in the last 72 hours.  RADIOLOGY: Ct Abdomen Pelvis Wo Contrast  Result Date: 05/29/2016 CLINICAL DATA:  Recent catheterization in the right groin. Anemia and abdominal wall bruising. Evaluation for hematoma. EXAM: CT ABDOMEN AND PELVIS WITHOUT CONTRAST TECHNIQUE: Multidetector CT imaging of the abdomen and pelvis was performed following the standard protocol without IV contrast. COMPARISON:  Renal ultrasound 01/06/2014 FINDINGS: Small bilateral pleural effusions are partially visualized with overlying bilateral lower lobe atelectasis. Three-vessel coronary artery calcification. Mitral annular calcification. The liver, gallbladder, and spleen are unremarkable. There is minimal nodularity of the left adrenal gland. There is marked left renal atrophy with a 4.0 cm low-density lesion extending anteriorly from the interpolar kidney likely representing a cyst. Subcentimeter hyperdense lesions in the upper pole of the left kidney likely represent hemorrhagic/proteinaceous cysts. A large right upper pole renal cyst measures 10.8 cm. The right kidney is inferiorly located/ectopic and abnormally rotated. A 1.4 cm right renal sinus cyst is noted inferiorly. There is no hydronephrosis. The right adrenal gland is not well seen due to regional distortion by the large renal cyst. A couple of punctate calcifications are noted in the pancreas. There is no evidence of bowel obstruction. There is left-sided colonic diverticulosis without evidence of diverticulitis. Assessment of the pelvis is partially limited by streak artifact from the patient's right total hip arthroplasty. The bladder is grossly unremarkable. The uterus is absent. Stranding is seen in the soft tissues of the right inguinal region consistent with recent catheterization. There is a 3.7 x 3.7 cm hematoma in the right groin. There is also small to moderate volume right-sided retroperitoneal hemorrhage in the pelvis  measuring up to 7.1 x 3.7 cm on axial images. There is extensive atherosclerotic calcification of the aorta and its major branch vessels. There is aneurysmal dilatation of the infrarenal abdominal aorta measuring 4.4 x 4.1 cm with displacement of intimal calcifications centrally into the lumen. No enlarged lymph nodes are identified. All multilevel disc and facet degeneration in the lumbar spine. IMPRESSION: 1. Small right groin hematoma and small to moderate sized right-sided pelvic retroperitoneal hematoma. 2. Small bilateral pleural effusions and bibasilar atelectasis. 3. Bilateral renal lesions as above including a large right upper pole cyst. The marked left renal atrophy. 4. 4.4 cm  infrarenal abdominal aortic aneurysm. Calcifications central in the lumen may reflect calcified plaque/thrombus although a dissection flap could also give this appearance. These results will be called to the ordering clinician or representative by the Radiologist Assistant, and communication documented in the PACS or zVision Dashboard. Electronically Signed   By: Sebastian Ache M.D.   On: 05/29/2016 00:46  Dg Chest 1 View  Result Date: 05/08/2016 CLINICAL DATA:  Shortness of breath for 2 weeks. EXAM: CHEST 1 VIEW COMPARISON:  09/16/2010 FINDINGS: The heart size and mediastinal contours are within normal limits. Aortic atherosclerosis noted. Both lungs are clear. No evidence of pulmonary infiltrate or pleural effusion. IMPRESSION: No active disease.  Aortic atherosclerosis noted. Electronically Signed   By: Myles Rosenthal M.D.   On: 05/08/2016 10:14   Ct Head Wo Contrast  Result Date: 05/08/2016 CLINICAL DATA:  Headache, nausea, and photophobia for 1 week. EXAM: CT HEAD WITHOUT CONTRAST TECHNIQUE: Contiguous axial images were obtained from the base of the skull through the vertex without intravenous contrast. COMPARISON:  04/28/2016 FINDINGS: There is no evidence of intracranial hemorrhage, brain edema, or other signs of acute  infarction. There is no evidence of intracranial mass lesion or mass effect. No abnormal extraaxial fluid collections are identified. Mild diffuse cerebral atrophy and chronic small vessel disease are stable in appearance. No evidence hydrocephalus. No skull abnormality identified. IMPRESSION: No acute intracranial abnormality. Stable cerebral atrophy and chronic small vessel disease. Electronically Signed   By: Myles Rosenthal M.D.   On: 05/08/2016 10:25   Ct Angio Chest Pe W Or Wo Contrast  Result Date: 05/08/2016 CLINICAL DATA:  Shortness of breath for 1 week. EXAM: CT ANGIOGRAPHY CHEST WITH CONTRAST TECHNIQUE: Multidetector CT imaging of the chest was performed using the standard protocol during bolus administration of intravenous contrast. Multiplanar CT image reconstructions and MIPs were obtained to evaluate the vascular anatomy. CONTRAST:  75 cc Isovue 370 IV COMPARISON:  Radiograph earlier this day. FINDINGS: Cardiovascular: Motion limits detailed assessment of the pulmonary arteries, particularly of the lower lobes, no evidence of filling defects or pulmonary embolus. Calcified and noncalcified atheromatous plaque throughout the thoracic aorta. No thoracic aortic aneurysm. Some of this plaque appears irregular. There are coronary artery calcifications. Minimal reflux of contrast into the IVC. Mediastinum/Nodes: There is an enlarged right hilar node measuring 1.8 cm short axis. Soft tissue density in the left infrahilar region may be a low left hilar node measuring 1.2 cm. Subcarinal node measures 11 mm. Lower anterior paratracheal node also measures 11 mm. Additional lower paratracheal nodes measuring 10 mm short axis. No pericardial effusion. Lungs/Pleura: In the left upper lobe is a 11 x 10 x 10 mm nodule image 36 series 7. Probable spiculated margins, however are motion artifact through this region. Punctate subpleural nodule in the right lower lobe image 58 series 7. Calcified granuloma in the lung  apices. Mild background emphysema. Bronchial thickening particularly in the lower lobes, detailed dependent lower lobe evaluation limited by motion. Small pleural effusion with adjacent densities likely atelectasis. Trachea and proximal bronchi are patent. Probable atelectasis in the medial right middle lobe. There are small bilateral pleural effusions with adjacent compressive atelectasis. Upper Abdomen: Cyst in the right upper quadrant likely renal in origin, and measures at least 8.3 cm, cyst described in this region on ultrasound measured 13 cm. Lobular borders of the spleen. Left adrenal gland tentatively identified without discrete nodule. Right adrenal gland not definitively seen. Tortuosity and atherosclerosis of the upper abdominal aorta. Musculoskeletal: No  blastic or destructive lytic lesions. Exaggerated thoracic kyphosis. Review of the MIP images confirms the above findings. IMPRESSION: 1. No pulmonary embolus. 2. Left upper lobe 1 cm pulmonary nodule, partially obscured by motion, however likely spiculated. Nodule is suspicious for primary bronchogenic malignancy. This is at the size limit for PET-CT characterization. There is an enlarged contralateral right hilar node and prominent mediastinal nodes. 3. Mild emphysema. Bronchial thickening is most prominent in the lower lobes, may be acute or chronic. 4. Small pleural effusions with likely adjacent atelectasis. 5. Thoracic aortic atherosclerosis including coronary artery calcifications. These results will be called to the ordering clinician or representative by the Radiologist Assistant, and communication documented in the PACS or zVision Dashboard. Electronically Signed   By: Rubye Oaks M.D.   On: 05/08/2016 19:03   Dg Chest Port 1 View  Result Date: 05/28/2016 CLINICAL DATA:  Shortness of breath and chest pain. EXAM: PORTABLE CHEST 1 VIEW COMPARISON:  05/24/2016 FINDINGS: There is bibasilar atelectasis and a small left pleural effusion. No  overt pulmonary edema. The heart size and mediastinal contours are normal. IMPRESSION: Bibasilar atelectasis with small left pleural effusion. Electronically Signed   By: Irish Lack M.D.   On: 05/28/2016 09:57   Dg Chest Port 1 View  Result Date: 05/24/2016 CLINICAL DATA:  Patient with respiratory failure. EXAM: PORTABLE CHEST 1 VIEW COMPARISON:  Chest radiograph 05/23/2016. FINDINGS: Multiple monitoring leads overlie the patient. Stable cardiomegaly with tortuosity and calcification of the thoracic aorta. Persistent bibasilar airspace opacities. Small bilateral pleural effusions. No pneumothorax. Known 1 cm left upper lobe nodule not well demonstrated on current evaluation. IMPRESSION: Unchanged bibasilar airspace opacities and small bilateral pleural effusions. Aortic vascular calcification. Electronically Signed   By: Annia Belt M.D.   On: 05/24/2016 09:52   Dg Chest Portable 1 View  Result Date: 05/23/2016 CLINICAL DATA:  Shortness of breath beginning this morning. EXAM: PORTABLE CHEST 1 VIEW COMPARISON:  Single-view of the chest 05/12/2016 and 05/08/2016. CT chest 05/08/2016. FINDINGS: The lungs are emphysematous. Since the most recent examination, right basilar airspace disease has worsened. The patient has a new small left pleural effusion with increased left basilar airspace disease. Small right effusion is noted. No pneumothorax. Heart size is normal. 1 cm left upper lobe nodule seen on the prior chest CT is not visible on this examination. Aortic atherosclerosis is noted. No focal bony abnormality. IMPRESSION: Increased bibasilar airspace disease and small effusions worrisome for pneumonia and/or pulmonary edema. Emphysema. Atherosclerosis. Electronically Signed   By: Drusilla Kanner M.D.   On: 05/23/2016 13:50   Dg Chest Port 1 View  Result Date: 05/12/2016 CLINICAL DATA:  Shortness of breath last night.  COPD EXAM: PORTABLE CHEST 1 VIEW COMPARISON:  05/08/2016 FINDINGS: Normal heart  size. No pleural effusion or edema. Aortic atherosclerosis. Moderate right pleural effusion is identified and is increased from 05/08/2016. Chronic interstitial coarsening is noted throughout both lungs. There is diminished aeration to the right base compatible with atelectasis and/or pneumonia. IMPRESSION: 1. New right pleural effusion 2. Worsening aeration to the right lung base. Electronically Signed   By: Signa Kell M.D.   On: 05/12/2016 09:28      ASSESSMENT AND PLAN: 1. RP hematoma: Received PRBC transfusion. Hemodynamically stable. Will need continued close monitoring of Hgb to make certain there isn't an abrupt drop. Over time these will usually resolve by themselves and heal. Hold ASA.  2. Acute on chronic diastolic HF: On IV Lasix. Would be careful so as not  to cause significant intravascular volume depletion given blood loss.  3. Essential HTN: Elevated. Would not increase meds at this time given RP bleed.  4. CAD: Mild by cath, continue statin. Hold ASA.  5. Hyperlipidemia: Continue statin.  6. Carotid artery disease: Will need outpatient Dopplers.   Time spent:  Greater than 50% was spent in counseling (XXXX) and coordination of care with the patient.  Prentice Docker, M.D., F.A.C.C.

## 2016-05-29 NOTE — Progress Notes (Signed)
PROGRESS NOTE                                                                                                                                                                                                             Patient Demographics:    Sydney Jacobs, is a 82 y.o. female, DOB - [birth year], ZOX:[id number]  Admit date - 05/23/2016   Admitting Physician Ozella Rocks, MD  Outpatient Primary MD for the patient is Colette Ribas, MD  LOS - 6  Chief Complaint  Patient presents with  . Shortness of Breath  . Chest Pain       Brief Narrative  82 yo f with history of diastolic CHF, CKD Stage III, and COPD presented with shortness of breath, significant volume overload, and mild chest pain secondary to cough. In the ED, she was noted to be in respiratory distress for which she was started on BiPAP with improvement. CXR with evidence of pleural effusion and BNP noted to be higher than prior admission, Cardiac cath on 7/20 significant for mild CAD, shortness significant anemia on 7/22, workup significant for right groin/right pelvic retroperitoneal hematoma, stable after transfusion.   Subjective:    Sydney Jacobs today has, No headache, No chest pain, No abdominal pain - Complaints of generalized weakness, reports dyspnea has improved.   Assessment  & Plan :    Active Problems:   CKD (chronic kidney disease) stage 3, GFR 30-59 ml/min   Essential hypertension   Acute respiratory failure with hypoxia (HCC)   Acute on chronic diastolic CHF (congestive heart failure) (HCC)   COPD (chronic obstructive pulmonary disease) (HCC)   Pressure ulcer   Acute respiratory failure with hypoxia  - Multifactorial, mainly due to acute on chronic diastolic CHF, as well baseline COPD. - Still requiring oxygen continue with acute CHF treatment and COPD  Acute on chronic diastolic CHF - TTE 05/09/16 w/ normal EF and grade 1 DD - not on ACE-I due to renal  function - Continue with IV diuresis, will resume on 40 Lasix IV twice a day, monitor renal function closely  Anemia - Acute blood loss anemia, CT abdomen pelvis significant for right groin/right pelvic retroperitoneal hematoma, continue to monitor, received 2 units PRBC on 7/22, hemoglobin remained stable since, Hemoccult negative . - Continue to hold aspirin and chemical DVT prophylaxis  AAA  - Patient with finding of 4.4 cm infrarenal abdominal aortic aneurysm , finding discussed with Dr. Myra Gianotti reviewed imaging, recommendation is for outpatient follow-up, he will arrange for patient to follow with him.  Chest pain  cardiac cath 7/20 noted only mild CAD - no more chest pain at this time   Acute COPD exacerbation - Patient with significant wheezing this a.m., will start on IV steroids, and nebulizer treatment.  Acute kidney failure on CKD Stage III hold ACEi/ARB - crt 1.34 - 2.16 earlier this month during admission - crt appears to have stabilized for now - follow as pt post-cath   Possible HCAP completed 3 days of zosyn - afeb w/ normal WBC -  Recheck CXR in AM   1 cm left upper lobe pulmonary nodule - Will need outpatient PET scan/further evaluation , Patient will continue follow-up with pulmonary Dr. Juanetta Gosling, was discussed with husband and  sons.   Essential HTN BP not at goal - continue with metoprolol, will increase hydralazine       Code Status : Full  Family Communication  : Husband at bedside  Disposition Plan  : will need PT evaluation   Consults  : cardiology, Pulmonary Dr. Juanetta [facility]ling at [facility]   Procedures  : Cardiac cath 7/20 , and his PRBC transfusion 7/22   DVT Prophylaxis  :   SCDs  Lab Results  Component Value Date   PLT 123 (L) 05/29/2016    Antibiotics  :    Anti-infectives    Start     Dose/Rate Route Frequency Ordered Stop   05/25/16 0600  vancomycin (VANCOCIN) 500 mg in sodium chloride 0.9 % 100 mL IVPB  Status:  Discontinued      500 mg 100 mL/hr over 60 Minutes Intravenous Every 24 hours 05/24/16 0848 05/25/16 1637   05/24/16 0900  piperacillin-tazobactam (ZOSYN) IVPB 3.375 g     3.375 g 12.5 mL/hr over 240 Minutes Intravenous Every 8 hours 05/24/16 0845 05/26/16 2104   05/24/16 0900  vancomycin (VANCOCIN) IVPB 1000 mg/200 mL premix     1,000 mg 200 mL/hr over 60 Minutes Intravenous  Once 05/24/16 0846 05/24/16 1027        Objective:   Vitals:   05/29/16 0120 05/29/16 0500 05/29/16 0632 05/29/16 1219  BP: (!) 160/56 (!) 155/86  (!) 175/73  Pulse: 73 79  84  Resp: 18 18  18   Temp: 98.2 F (36.8 C) 98 F (36.7 C)  98.2 F (36.8 C)  TempSrc: Oral Oral  Oral  SpO2: 96% 96%  98%  Weight:   52.8 kg (116 lb 6.4 oz)   Height:        Wt Readings from Last 3 Encounters:  05/29/16 52.8 kg (116 lb 6.4 oz)  05/13/16 56 kg (123 lb 7.3 oz)     Intake/Output Summary (Last 24 hours) at 05/29/16 1230 Last data filed at 05/29/16 0950  Gross per 24 hour  Intake             1242 ml  Output             1501 ml  Net             -259 ml     Physical Exam General: No acute respiratory distress at rest Lungs: Poor air movement bilateral bases - good air movement throughout other fields with no wheeze Cardiovascular: Regular rate and rhythm without murmur gallop or rub normal S1 and S2 Abdomen: Nontender, nondistended,  soft, bowel sounds positive, no rebound, Significant bruising in abd  including right, left, and hypogastric area, as well right groin Extremities: No significant cyanosis, clubbing, edema bilateral lower extremities    Data Review:    CBC  Recent Labs Lab 05/23/16 1330 05/28/16 0329 05/28/16 0747 05/29/16 0307  WBC 18.8* 7.3 6.5 7.3  HGB 10.5* 6.4* 6.8* 9.6*  HCT 33.3* 21.4* 21.9* 29.7*  PLT 186 129* 129* 123*  MCV 87.9 88.8 88.7 85.6  MCH 27.7 27.4 27.5 27.7  MCHC 31.5 30.8 31.1 32.3  RDW 17.1* 17.0* 16.9* 17.3*  LYMPHSABS 1.5  --   --   --   MONOABS 1.2*  --   --   --   EOSABS  0.3  --   --   --   BASOSABS 0.0  --   --   --     Chemistries   Recent Labs Lab 05/25/16 0930 05/26/16 0354 05/26/16 1317 05/28/16 0329 05/29/16 0307  NA 138 140 142 142 141  K 4.0 4.0 3.8 3.5 3.4*  CL 105 108 108 107 108  CO2 24 24 26 28 29   GLUCOSE 125* 108* 98 97 106*  BUN 46* 56* 49* 49* 39*  CREATININE 1.88* 2.09* 1.91* 1.75* 1.41*  CALCIUM 7.9* 7.4* 8.0* 7.6* 7.8*  AST  --   --   --  40  --   ALT  --   --   --  32  --   ALKPHOS  --   --   --  38  --   BILITOT  --   --   --  0.4  --    ------------------------------------------------------------------------------------------------------------------ No results for input(s): CHOL, HDL, LDLCALC, TRIG, CHOLHDL, LDLDIRECT in the last 72 hours.  No results found for: HGBA1C ------------------------------------------------------------------------------------------------------------------ No results for input(s): TSH, T4TOTAL, T3FREE, THYROIDAB in the last 72 hours.  Invalid input(s): FREET3 ------------------------------------------------------------------------------------------------------------------ No results for input(s): VITAMINB12, FOLATE, FERRITIN, TIBC, IRON, RETICCTPCT in the last 72 hours.  Coagulation profile  Recent Labs Lab 05/23/16 1330  INR 0.98    No results for input(s): DDIMER in the last 72 hours.  Cardiac Enzymes  Recent Labs Lab 05/23/16 1727 05/23/16 2302 05/24/16 0458  TROPONINI 0.03* 0.03* 0.03*   ------------------------------------------------------------------------------------------------------------------    Component Value Date/Time   BNP 1,216.0 (H) 05/23/2016 1330    Inpatient Medications  Scheduled Meds: . sodium chloride   Intravenous Once  . allopurinol  100 mg Oral QHS  . amLODipine  10 mg Oral Daily  . antiseptic oral rinse  7 mL Mouth Rinse q12n4p  . atorvastatin  80 mg Oral q1800  . chlorhexidine  15 mL Mouth Rinse BID  . furosemide  40 mg Intravenous BID    . hydrALAZINE  50 mg Oral Q8H  . Living Better with Heart Failure Book   Does not apply Once  . loratadine  10 mg Oral Daily  . methylPREDNISolone (SOLU-MEDROL) injection  60 mg Intravenous Q12H  . metoprolol tartrate  50 mg Oral BID  . pantoprazole  40 mg Oral Daily   Continuous Infusions:  PRN Meds:.acetaminophen, albuterol, guaiFENesin, ondansetron (ZOFRAN) IV  Micro Results Recent Results (from the past 240 hour(s))  MRSA PCR Screening     Status: None   Collection Time: 05/23/16  6:40 PM  Result Value Ref Range Status   MRSA by PCR NEGATIVE NEGATIVE Final    Comment:        The GeneXpert MRSA Assay (FDA approved for NASAL specimens  only), is one component of a comprehensive MRSA colonization surveillance program. It is not intended to diagnose MRSA infection nor to guide or monitor treatment for MRSA infections.     Radiology Reports Ct Abdomen Pelvis Wo Contrast  Result Date: 05/29/2016 CLINICAL DATA:  Recent catheterization in the right groin. Anemia and abdominal wall bruising. Evaluation for hematoma. EXAM: CT ABDOMEN AND PELVIS WITHOUT CONTRAST TECHNIQUE: Multidetector CT imaging of the abdomen and pelvis was performed following the standard protocol without IV contrast. COMPARISON:  Renal ultrasound 01/06/2014 FINDINGS: Small bilateral pleural effusions are partially visualized with overlying bilateral lower lobe atelectasis. Three-vessel coronary artery calcification. Mitral annular calcification. The liver, gallbladder, and spleen are unremarkable. There is minimal nodularity of the left adrenal gland. There is marked left renal atrophy with a 4.0 cm low-density lesion extending anteriorly from the interpolar kidney likely representing a cyst. Subcentimeter hyperdense lesions in the upper pole of the left kidney likely represent hemorrhagic/proteinaceous cysts. A large right upper pole renal cyst measures 10.8 cm. The right kidney is inferiorly located/ectopic and  abnormally rotated. A 1.4 cm right renal sinus cyst is noted inferiorly. There is no hydronephrosis. The right adrenal gland is not well seen due to regional distortion by the large renal cyst. A couple of punctate calcifications are noted in the pancreas. There is no evidence of bowel obstruction. There is left-sided colonic diverticulosis without evidence of diverticulitis. Assessment of the pelvis is partially limited by streak artifact from the patient's right total hip arthroplasty. The bladder is grossly unremarkable. The uterus is absent. Stranding is seen in the soft tissues of the right inguinal region consistent with recent catheterization. There is a 3.7 x 3.7 cm hematoma in the right groin. There is also small to moderate volume right-sided retroperitoneal hemorrhage in the pelvis measuring up to 7.1 x 3.7 cm on axial images. There is extensive atherosclerotic calcification of the aorta and its major branch vessels. There is aneurysmal dilatation of the infrarenal abdominal aorta measuring 4.4 x 4.1 cm with displacement of intimal calcifications centrally into the lumen. No enlarged lymph nodes are identified. All multilevel disc and facet degeneration in the lumbar spine. IMPRESSION: 1. Small right groin hematoma and small to moderate sized right-sided pelvic retroperitoneal hematoma. 2. Small bilateral pleural effusions and bibasilar atelectasis. 3. Bilateral renal lesions as above including a large right upper pole cyst. The marked left renal atrophy. 4. 4.4 cm infrarenal abdominal aortic aneurysm. Calcifications central in the lumen may reflect calcified plaque/thrombus although a dissection flap could also give this appearance. These results will be called to the ordering clinician or representative by the Radiologist Assistant, and communication documented in the PACS or zVision Dashboard. Electronically Signed   By: Sebastian Ache M.D.   On: 05/29/2016 00:46  Dg Chest 1 View  Result Date:  05/08/2016 CLINICAL DATA:  Shortness of breath for 2 weeks. EXAM: CHEST 1 VIEW COMPARISON:  09/16/2010 FINDINGS: The heart size and mediastinal contours are within normal limits. Aortic atherosclerosis noted. Both lungs are clear. No evidence of pulmonary infiltrate or pleural effusion. IMPRESSION: No active disease.  Aortic atherosclerosis noted. Electronically Signed   By: Myles Rosenthal M.D.   On: 05/08/2016 10:14   Ct Head Wo Contrast  Result Date: 05/08/2016 CLINICAL DATA:  Headache, nausea, and photophobia for 1 week. EXAM: CT HEAD WITHOUT CONTRAST TECHNIQUE: Contiguous axial images were obtained from the base of the skull through the vertex without intravenous contrast. COMPARISON:  04/28/2016 FINDINGS: There is no evidence of  intracranial hemorrhage, brain edema, or other signs of acute infarction. There is no evidence of intracranial mass lesion or mass effect. No abnormal extraaxial fluid collections are identified. Mild diffuse cerebral atrophy and chronic small vessel disease are stable in appearance. No evidence hydrocephalus. No skull abnormality identified. IMPRESSION: No acute intracranial abnormality. Stable cerebral atrophy and chronic small vessel disease. Electronically Signed   By: Myles Rosenthal M.D.   On: 05/08/2016 10:25   Ct Angio Chest Pe W Or Wo Contrast  Result Date: 05/08/2016 CLINICAL DATA:  Shortness of breath for 1 week. EXAM: CT ANGIOGRAPHY CHEST WITH CONTRAST TECHNIQUE: Multidetector CT imaging of the chest was performed using the standard protocol during bolus administration of intravenous contrast. Multiplanar CT image reconstructions and MIPs were obtained to evaluate the vascular anatomy. CONTRAST:  75 cc Isovue 370 IV COMPARISON:  Radiograph earlier this day. FINDINGS: Cardiovascular: Motion limits detailed assessment of the pulmonary arteries, particularly of the lower lobes, no evidence of filling defects or pulmonary embolus. Calcified and noncalcified atheromatous plaque  throughout the thoracic aorta. No thoracic aortic aneurysm. Some of this plaque appears irregular. There are coronary artery calcifications. Minimal reflux of contrast into the IVC. Mediastinum/Nodes: There is an enlarged right hilar node measuring 1.8 cm short axis. Soft tissue density in the left infrahilar region may be a low left hilar node measuring 1.2 cm. Subcarinal node measures 11 mm. Lower anterior paratracheal node also measures 11 mm. Additional lower paratracheal nodes measuring 10 mm short axis. No pericardial effusion. Lungs/Pleura: In the left upper lobe is a 11 x 10 x 10 mm nodule image 36 series 7. Probable spiculated margins, however are motion artifact through this region. Punctate subpleural nodule in the right lower lobe image 58 series 7. Calcified granuloma in the lung apices. Mild background emphysema. Bronchial thickening particularly in the lower lobes, detailed dependent lower lobe evaluation limited by motion. Small pleural effusion with adjacent densities likely atelectasis. Trachea and proximal bronchi are patent. Probable atelectasis in the medial right middle lobe. There are small bilateral pleural effusions with adjacent compressive atelectasis. Upper Abdomen: Cyst in the right upper quadrant likely renal in origin, and measures at least 8.3 cm, cyst described in this region on ultrasound measured 13 cm. Lobular borders of the spleen. Left adrenal gland tentatively identified without discrete nodule. Right adrenal gland not definitively seen. Tortuosity and atherosclerosis of the upper abdominal aorta. Musculoskeletal: No blastic or destructive lytic lesions. Exaggerated thoracic kyphosis. Review of the MIP images confirms the above findings. IMPRESSION: 1. No pulmonary embolus. 2. Left upper lobe 1 cm pulmonary nodule, partially obscured by motion, however likely spiculated. Nodule is suspicious for primary bronchogenic malignancy. This is at the size limit for PET-CT  characterization. There is an enlarged contralateral right hilar node and prominent mediastinal nodes. 3. Mild emphysema. Bronchial thickening is most prominent in the lower lobes, may be acute or chronic. 4. Small pleural effusions with likely adjacent atelectasis. 5. Thoracic aortic atherosclerosis including coronary artery calcifications. These results will be called to the ordering clinician or representative by the Radiologist Assistant, and communication documented in the PACS or zVision Dashboard. Electronically Signed   By: Rubye Oaks M.D.   On: 05/08/2016 19:03   Dg Chest Port 1 View  Result Date: 05/28/2016 CLINICAL DATA:  Shortness of breath and chest pain. EXAM: PORTABLE CHEST 1 VIEW COMPARISON:  05/24/2016 FINDINGS: There is bibasilar atelectasis and a small left pleural effusion. No overt pulmonary edema. The heart size and mediastinal contours  are normal. IMPRESSION: Bibasilar atelectasis with small left pleural effusion. Electronically Signed   By: Irish Lack M.D.   On: 05/28/2016 09:57   Dg Chest Port 1 View  Result Date: 05/24/2016 CLINICAL DATA:  Patient with respiratory failure. EXAM: PORTABLE CHEST 1 VIEW COMPARISON:  Chest radiograph 05/23/2016. FINDINGS: Multiple monitoring leads overlie the patient. Stable cardiomegaly with tortuosity and calcification of the thoracic aorta. Persistent bibasilar airspace opacities. Small bilateral pleural effusions. No pneumothorax. Known 1 cm left upper lobe nodule not well demonstrated on current evaluation. IMPRESSION: Unchanged bibasilar airspace opacities and small bilateral pleural effusions. Aortic vascular calcification. Electronically Signed   By: Annia Belt M.D.   On: 05/24/2016 09:52   Dg Chest Portable 1 View  Result Date: 05/23/2016 CLINICAL DATA:  Shortness of breath beginning this morning. EXAM: PORTABLE CHEST 1 VIEW COMPARISON:  Single-view of the chest 05/12/2016 and 05/08/2016. CT chest 05/08/2016. FINDINGS: The lungs  are emphysematous. Since the most recent examination, right basilar airspace disease has worsened. The patient has a new small left pleural effusion with increased left basilar airspace disease. Small right effusion is noted. No pneumothorax. Heart size is normal. 1 cm left upper lobe nodule seen on the prior chest CT is not visible on this examination. Aortic atherosclerosis is noted. No focal bony abnormality. IMPRESSION: Increased bibasilar airspace disease and small effusions worrisome for pneumonia and/or pulmonary edema. Emphysema. Atherosclerosis. Electronically Signed   By: Drusilla Kanner M.D.   On: 05/23/2016 13:50   Dg Chest Port 1 View  Result Date: 05/12/2016 CLINICAL DATA:  Shortness of breath last night.  COPD EXAM: PORTABLE CHEST 1 VIEW COMPARISON:  05/08/2016 FINDINGS: Normal heart size. No pleural effusion or edema. Aortic atherosclerosis. Moderate right pleural effusion is identified and is increased from 05/08/2016. Chronic interstitial coarsening is noted throughout both lungs. There is diminished aeration to the right base compatible with atelectasis and/or pneumonia. IMPRESSION: 1. New right pleural effusion 2. Worsening aeration to the right lung base. Electronically Signed   By: Signa Kell M.D.   On: 05/12/2016 09:28     ELGERGAWY, DAWOOD M.D on 05/29/2016 at 12:30 PM  Between 7am to 7pm - Pager - [phone]  After 7pm go to [email or web address] - password [email or web address]  [facility] -  Office  [phone]

## 2016-05-29 NOTE — Progress Notes (Signed)
Pt has a 7.1x3.7cm retroperitoneal hemorrhage in the pelvis and 4.4 cm infrarenal abdominal aortic aneurysm according to the CT abdomen and pelvis, MD was notified and aware, will continue to monitor, Thanks Lavonda Jumbo RN.

## 2016-05-30 ENCOUNTER — Other Ambulatory Visit: Payer: Self-pay | Admitting: *Deleted

## 2016-05-30 DIAGNOSIS — I714 Abdominal aortic aneurysm, without rupture, unspecified: Secondary | ICD-10-CM

## 2016-05-30 LAB — CBC
HEMATOCRIT: 32.2 % — AB (ref 36.0–46.0)
HEMOGLOBIN: 10.4 g/dL — AB (ref 12.0–15.0)
MCH: 27.5 pg (ref 26.0–34.0)
MCHC: 32.3 g/dL (ref 30.0–36.0)
MCV: 85.2 fL (ref 78.0–100.0)
Platelets: 148 10*3/uL — ABNORMAL LOW (ref 150–400)
RBC: 3.78 MIL/uL — ABNORMAL LOW (ref 3.87–5.11)
RDW: 16.8 % — ABNORMAL HIGH (ref 11.5–15.5)
WBC: 6.6 10*3/uL (ref 4.0–10.5)

## 2016-05-30 LAB — BASIC METABOLIC PANEL
Anion gap: 9 (ref 5–15)
BUN: 39 mg/dL — ABNORMAL HIGH (ref 6–20)
CHLORIDE: 102 mmol/L (ref 101–111)
CO2: 28 mmol/L (ref 22–32)
CREATININE: 1.6 mg/dL — AB (ref 0.44–1.00)
Calcium: 8.2 mg/dL — ABNORMAL LOW (ref 8.9–10.3)
GFR calc non Af Amer: 30 mL/min — ABNORMAL LOW (ref >60)
GFR, EST AFRICAN AMERICAN: 34 mL/min — AB (ref >60)
Glucose, Bld: 153 mg/dL — ABNORMAL HIGH (ref 65–99)
POTASSIUM: 3.7 mmol/L (ref 3.5–5.1)
Sodium: 139 mmol/L (ref 135–145)

## 2016-05-30 MED ORDER — FUROSEMIDE 10 MG/ML IJ SOLN
40.0000 mg | Freq: Every day | INTRAMUSCULAR | Status: DC
Start: 2016-05-30 — End: 2016-05-31
  Administered 2016-05-30: 40 mg via INTRAVENOUS
  Filled 2016-05-30: qty 4

## 2016-05-30 MED ORDER — METHYLPREDNISOLONE SODIUM SUCC 40 MG IJ SOLR
40.0000 mg | Freq: Two times a day (BID) | INTRAMUSCULAR | Status: DC
Start: 2016-05-30 — End: 2016-05-31
  Administered 2016-05-30 – 2016-05-31 (×3): 40 mg via INTRAVENOUS
  Filled 2016-05-30 (×3): qty 1

## 2016-05-30 NOTE — Progress Notes (Signed)
SUBJECTIVE: H/H stable after transfusion. Dyspnea has improved. Diuretics have been stopped.  ROS: Other than pertinent positives in "Subjective", all others were reviewed and found to be negative.   Intake/Output Summary (Last 24 hours) at 05/30/16 0848 Last data filed at 05/30/16 0700  Gross per 24 hour  Intake              680 ml  Output             1701 ml  Net            -1021 ml    Current Facility-Administered Medications  Medication Dose Route Frequency Provider Last Rate Last Dose  . 0.9 %  sodium chloride infusion   Intravenous Once Jinger Neighbors, NP      . acetaminophen (TYLENOL) tablet 650 mg  650 mg Oral Q4H PRN Erick Blinks, MD      . albuterol (PROVENTIL) (2.5 MG/3ML) 0.083% nebulizer solution 2.5 mg  2.5 mg Nebulization Q2H PRN Lonia Blood, MD      . allopurinol (ZYLOPRIM) tablet 100 mg  100 mg Oral QHS Erick Blinks, MD   100 mg at 05/29/16 2159  . amLODipine (NORVASC) tablet 10 mg  10 mg Oral Daily Erick Blinks, MD   10 mg at 05/29/16 0949  . antiseptic oral rinse (CPC / CETYLPYRIDINIUM CHLORIDE 0.05%) solution 7 mL  7 mL Mouth Rinse q12n4p Erick Blinks, MD   7 mL at 05/29/16 1600  . atorvastatin (LIPITOR) tablet 80 mg  80 mg Oral q1800 Jonelle Sidle, MD   80 mg at 05/29/16 1706  . chlorhexidine (PERIDEX) 0.12 % solution 15 mL  15 mL Mouth Rinse BID Erick Blinks, MD   15 mL at 05/29/16 0949  . guaiFENesin (MUCINEX) 12 hr tablet 1,200 mg  1,200 mg Oral BID PRN Lonia Blood, MD      . hydrALAZINE (APRESOLINE) tablet 50 mg  50 mg Oral Q8H Starleen Arms, MD   50 mg at 05/30/16 4098  . Living Better with Heart Failure Book   Does not apply Once Erick Blinks, MD      . loratadine (CLARITIN) tablet 10 mg  10 mg Oral Daily Erick Blinks, MD   10 mg at 05/29/16 0949  . methylPREDNISolone sodium succinate (SOLU-MEDROL) 125 mg/2 mL injection 60 mg  60 mg Intravenous Q12H Starleen Arms, MD   60 mg at 05/30/16 0002  . metoprolol  (LOPRESSOR) tablet 50 mg  50 mg Oral BID Starleen Arms, MD   50 mg at 05/29/16 2159  . ondansetron (ZOFRAN) injection 4 mg  4 mg Intravenous Q6H PRN Erick Blinks, MD   4 mg at 05/26/16 1724  . pantoprazole (PROTONIX) EC tablet 40 mg  40 mg Oral Daily Starleen Arms, MD   40 mg at 05/29/16 1259    Vitals:   05/29/16 0632 05/29/16 1219 05/29/16 1940 05/30/16 0508  BP:  (!) 175/73 (!) 158/73 (!) 141/64  Pulse:  84 84 66  Resp:  18 17 18   Temp:  98.2 F (36.8 C) 98.6 F (37 C) 97.5 F (36.4 C)  TempSrc:  Oral Oral Oral  SpO2:  98% 97% 96%  Weight: 116 lb 6.4 oz (52.8 kg)   116 lb 11.2 oz (52.9 kg)  Height:        PHYSICAL EXAM General: NAD HEENT: Normal. Neck: No JVD, no thyromegaly.  Lungs: Clear to auscultation bilaterally with normal  respiratory effort. CV: Nondisplaced PMI.  Regular rate and rhythm, normal S1/S2, no S3/S4, no murmur.  No pretibial edema. Abdomen: Soft, mild right groin tenderness/ecchymoses Neurologic: Alert and oriented x 3.  Psych: Normal affect.   LABS: Basic Metabolic Panel:  Recent Labs  56/31/49 0307 05/30/16 0253  NA 141 139  K 3.4* 3.7  CL 108 102  CO2 29 28  GLUCOSE 106* 153*  BUN 39* 39*  CREATININE 1.41* 1.60*  CALCIUM 7.8* 8.2*   Liver Function Tests:  Recent Labs  05/28/16 0329  AST 40  ALT 32  ALKPHOS 38  BILITOT 0.4  PROT 4.2*  ALBUMIN 2.4*   No results for input(s): LIPASE, AMYLASE in the last 72 hours. CBC:  Recent Labs  05/29/16 0307 05/30/16 0253  WBC 7.3 6.6  HGB 9.6* 10.4*  HCT 29.7* 32.2*  MCV 85.6 85.2  PLT 123* 148*   Cardiac Enzymes: No results for input(s): CKTOTAL, CKMB, CKMBINDEX, TROPONINI in the last 72 hours. BNP: Invalid input(s): POCBNP D-Dimer: No results for input(s): DDIMER in the last 72 hours. Hemoglobin A1C: No results for input(s): HGBA1C in the last 72 hours. Fasting Lipid Panel: No results for input(s): CHOL, HDL, LDLCALC, TRIG, CHOLHDL, LDLDIRECT in the last 72  hours. Thyroid Function Tests: No results for input(s): TSH, T4TOTAL, T3FREE, THYROIDAB in the last 72 hours.  Invalid input(s): FREET3 Anemia Panel: No results for input(s): VITAMINB12, FOLATE, FERRITIN, TIBC, IRON, RETICCTPCT in the last 72 hours.  RADIOLOGY: Ct Abdomen Pelvis Wo Contrast  Result Date: 05/29/2016 CLINICAL DATA:  Recent catheterization in the right groin. Anemia and abdominal wall bruising. Evaluation for hematoma. EXAM: CT ABDOMEN AND PELVIS WITHOUT CONTRAST TECHNIQUE: Multidetector CT imaging of the abdomen and pelvis was performed following the standard protocol without IV contrast. COMPARISON:  Renal ultrasound 01/06/2014 FINDINGS: Small bilateral pleural effusions are partially visualized with overlying bilateral lower lobe atelectasis. Three-vessel coronary artery calcification. Mitral annular calcification. The liver, gallbladder, and spleen are unremarkable. There is minimal nodularity of the left adrenal gland. There is marked left renal atrophy with a 4.0 cm low-density lesion extending anteriorly from the interpolar kidney likely representing a cyst. Subcentimeter hyperdense lesions in the upper pole of the left kidney likely represent hemorrhagic/proteinaceous cysts. A large right upper pole renal cyst measures 10.8 cm. The right kidney is inferiorly located/ectopic and abnormally rotated. A 1.4 cm right renal sinus cyst is noted inferiorly. There is no hydronephrosis. The right adrenal gland is not well seen due to regional distortion by the large renal cyst. A couple of punctate calcifications are noted in the pancreas. There is no evidence of bowel obstruction. There is left-sided colonic diverticulosis without evidence of diverticulitis. Assessment of the pelvis is partially limited by streak artifact from the patient's right total hip arthroplasty. The bladder is grossly unremarkable. The uterus is absent. Stranding is seen in the soft tissues of the right inguinal  region consistent with recent catheterization. There is a 3.7 x 3.7 cm hematoma in the right groin. There is also small to moderate volume right-sided retroperitoneal hemorrhage in the pelvis measuring up to 7.1 x 3.7 cm on axial images. There is extensive atherosclerotic calcification of the aorta and its major branch vessels. There is aneurysmal dilatation of the infrarenal abdominal aorta measuring 4.4 x 4.1 cm with displacement of intimal calcifications centrally into the lumen. No enlarged lymph nodes are identified. All multilevel disc and facet degeneration in the lumbar spine. IMPRESSION: 1. Small right groin hematoma and small to  moderate sized right-sided pelvic retroperitoneal hematoma. 2. Small bilateral pleural effusions and bibasilar atelectasis. 3. Bilateral renal lesions as above including a large right upper pole cyst. The marked left renal atrophy. 4. 4.4 cm infrarenal abdominal aortic aneurysm. Calcifications central in the lumen may reflect calcified plaque/thrombus although a dissection flap could also give this appearance. These results will be called to the ordering clinician or representative by the Radiologist Assistant, and communication documented in the PACS or zVision Dashboard. Electronically Signed   By: Sebastian Ache M.D.   On: 05/29/2016 00:46  Dg Chest 1 View  Result Date: 05/08/2016 CLINICAL DATA:  Shortness of breath for 2 weeks. EXAM: CHEST 1 VIEW COMPARISON:  09/16/2010 FINDINGS: The heart size and mediastinal contours are within normal limits. Aortic atherosclerosis noted. Both lungs are clear. No evidence of pulmonary infiltrate or pleural effusion. IMPRESSION: No active disease.  Aortic atherosclerosis noted. Electronically Signed   By: Myles Rosenthal M.D.   On: 05/08/2016 10:14   Ct Head Wo Contrast  Result Date: 05/08/2016 CLINICAL DATA:  Headache, nausea, and photophobia for 1 week. EXAM: CT HEAD WITHOUT CONTRAST TECHNIQUE: Contiguous axial images were obtained from the  base of the skull through the vertex without intravenous contrast. COMPARISON:  04/28/2016 FINDINGS: There is no evidence of intracranial hemorrhage, brain edema, or other signs of acute infarction. There is no evidence of intracranial mass lesion or mass effect. No abnormal extraaxial fluid collections are identified. Mild diffuse cerebral atrophy and chronic small vessel disease are stable in appearance. No evidence hydrocephalus. No skull abnormality identified. IMPRESSION: No acute intracranial abnormality. Stable cerebral atrophy and chronic small vessel disease. Electronically Signed   By: Myles Rosenthal M.D.   On: 05/08/2016 10:25   Ct Angio Chest Pe W Or Wo Contrast  Result Date: 05/08/2016 CLINICAL DATA:  Shortness of breath for 1 week. EXAM: CT ANGIOGRAPHY CHEST WITH CONTRAST TECHNIQUE: Multidetector CT imaging of the chest was performed using the standard protocol during bolus administration of intravenous contrast. Multiplanar CT image reconstructions and MIPs were obtained to evaluate the vascular anatomy. CONTRAST:  75 cc Isovue 370 IV COMPARISON:  Radiograph earlier this day. FINDINGS: Cardiovascular: Motion limits detailed assessment of the pulmonary arteries, particularly of the lower lobes, no evidence of filling defects or pulmonary embolus. Calcified and noncalcified atheromatous plaque throughout the thoracic aorta. No thoracic aortic aneurysm. Some of this plaque appears irregular. There are coronary artery calcifications. Minimal reflux of contrast into the IVC. Mediastinum/Nodes: There is an enlarged right hilar node measuring 1.8 cm short axis. Soft tissue density in the left infrahilar region may be a low left hilar node measuring 1.2 cm. Subcarinal node measures 11 mm. Lower anterior paratracheal node also measures 11 mm. Additional lower paratracheal nodes measuring 10 mm short axis. No pericardial effusion. Lungs/Pleura: In the left upper lobe is a 11 x 10 x 10 mm nodule image 36 series  7. Probable spiculated margins, however are motion artifact through this region. Punctate subpleural nodule in the right lower lobe image 58 series 7. Calcified granuloma in the lung apices. Mild background emphysema. Bronchial thickening particularly in the lower lobes, detailed dependent lower lobe evaluation limited by motion. Small pleural effusion with adjacent densities likely atelectasis. Trachea and proximal bronchi are patent. Probable atelectasis in the medial right middle lobe. There are small bilateral pleural effusions with adjacent compressive atelectasis. Upper Abdomen: Cyst in the right upper quadrant likely renal in origin, and measures at least 8.3 cm, cyst described in this  region on ultrasound measured 13 cm. Lobular borders of the spleen. Left adrenal gland tentatively identified without discrete nodule. Right adrenal gland not definitively seen. Tortuosity and atherosclerosis of the upper abdominal aorta. Musculoskeletal: No blastic or destructive lytic lesions. Exaggerated thoracic kyphosis. Review of the MIP images confirms the above findings. IMPRESSION: 1. No pulmonary embolus. 2. Left upper lobe 1 cm pulmonary nodule, partially obscured by motion, however likely spiculated. Nodule is suspicious for primary bronchogenic malignancy. This is at the size limit for PET-CT characterization. There is an enlarged contralateral right hilar node and prominent mediastinal nodes. 3. Mild emphysema. Bronchial thickening is most prominent in the lower lobes, may be acute or chronic. 4. Small pleural effusions with likely adjacent atelectasis. 5. Thoracic aortic atherosclerosis including coronary artery calcifications. These results will be called to the ordering clinician or representative by the Radiologist Assistant, and communication documented in the PACS or zVision Dashboard. Electronically Signed   By: Rubye Oaks M.D.   On: 05/08/2016 19:03   Dg Chest Port 1 View  Result Date:  05/28/2016 CLINICAL DATA:  Shortness of breath and chest pain. EXAM: PORTABLE CHEST 1 VIEW COMPARISON:  05/24/2016 FINDINGS: There is bibasilar atelectasis and a small left pleural effusion. No overt pulmonary edema. The heart size and mediastinal contours are normal. IMPRESSION: Bibasilar atelectasis with small left pleural effusion. Electronically Signed   By: Irish Lack M.D.   On: 05/28/2016 09:57   Dg Chest Port 1 View  Result Date: 05/24/2016 CLINICAL DATA:  Patient with respiratory failure. EXAM: PORTABLE CHEST 1 VIEW COMPARISON:  Chest radiograph 05/23/2016. FINDINGS: Multiple monitoring leads overlie the patient. Stable cardiomegaly with tortuosity and calcification of the thoracic aorta. Persistent bibasilar airspace opacities. Small bilateral pleural effusions. No pneumothorax. Known 1 cm left upper lobe nodule not well demonstrated on current evaluation. IMPRESSION: Unchanged bibasilar airspace opacities and small bilateral pleural effusions. Aortic vascular calcification. Electronically Signed   By: Annia Belt M.D.   On: 05/24/2016 09:52   Dg Chest Portable 1 View  Result Date: 05/23/2016 CLINICAL DATA:  Shortness of breath beginning this morning. EXAM: PORTABLE CHEST 1 VIEW COMPARISON:  Single-view of the chest 05/12/2016 and 05/08/2016. CT chest 05/08/2016. FINDINGS: The lungs are emphysematous. Since the most recent examination, right basilar airspace disease has worsened. The patient has a new small left pleural effusion with increased left basilar airspace disease. Small right effusion is noted. No pneumothorax. Heart size is normal. 1 cm left upper lobe nodule seen on the prior chest CT is not visible on this examination. Aortic atherosclerosis is noted. No focal bony abnormality. IMPRESSION: Increased bibasilar airspace disease and small effusions worrisome for pneumonia and/or pulmonary edema. Emphysema. Atherosclerosis. Electronically Signed   By: Drusilla Kanner M.D.   On:  05/23/2016 13:50   Dg Chest Port 1 View  Result Date: 05/12/2016 CLINICAL DATA:  Shortness of breath last night.  COPD EXAM: PORTABLE CHEST 1 VIEW COMPARISON:  05/08/2016 FINDINGS: Normal heart size. No pleural effusion or edema. Aortic atherosclerosis. Moderate right pleural effusion is identified and is increased from 05/08/2016. Chronic interstitial coarsening is noted throughout both lungs. There is diminished aeration to the right base compatible with atelectasis and/or pneumonia. IMPRESSION: 1. New right pleural effusion 2. Worsening aeration to the right lung base. Electronically Signed   By: Signa Kell M.D.   On: 05/12/2016 09:28      ASSESSMENT AND PLAN: 1. RP hematoma: Received PRBC transfusion. Hemodynamically stable. H/H stable. Continue to hold ASA for up  to 7 days after bleed and then consider restart 81 mg daily.   2. Acute on chronic diastolic HF: Lasix discontinued - monitor volume status. Will likely need lasix at discharge to maintain euvolemia. There is CKD3, so creatinine may be settling out, but would not keep her off of lasix as there is risk of recurrent CHF.  3. Essential HTN: Mildly elevated - continue current anti-hypertensive meds.  4. CAD: Mild by cath, continue statin. Hold ASA.    5. Hyperlipidemia: Continue statin.  6. Carotid artery disease: Will need outpatient Dopplers.  7. Infrarenal AAA: will need outpatient monitoring.  No further suggestions at this time. Cardiology will sign-off. Call with questions. Follow-up with Dr. Diona Browner in [place].  TIME SPENT WITH PATIENT: 15 minutes   Chrystie Nose, MD, [facility] Attending Cardiologist [facility]

## 2016-05-30 NOTE — Care Management Important Message (Signed)
Important Message  Patient Details  Name: Sydney Jacobs MRN: [id number] Date of Birth: [birth year]   Medicare Important Message Given:  Yes    Bernadette Hoit 05/30/2016, 8:58 AM

## 2016-05-30 NOTE — Progress Notes (Signed)
PROGRESS NOTE                                                                                                                                                                                                             Patient Demographics:    Sydney Jacobs, is a 82 y.o. female, DOB - 1934/04/12, ZOX:[id number]  Admit date - 05/23/2016   Admitting Physician Ozella Rocks, MD  Outpatient Primary MD for the patient is Colette Ribas, MD  LOS - 7  Chief Complaint  Patient presents with  . Shortness of Breath  . Chest Pain       Brief Narrative  82 yo f with history of diastolic CHF, CKD Stage III, and COPD presented with shortness of breath, significant volume overload, and mild chest pain secondary to cough. In the ED, she was noted to be in respiratory distress for which she was started on BiPAP with improvement. CXR with evidence of pleural effusion and BNP noted to be higher than prior admission, Cardiac cath on 7/20 significant for mild CAD, shortness significant anemia on 7/22, workup significant for right groin/right pelvic retroperitoneal hematoma, stable after transfusion, Glycohemoglobin remained stable after 2 units PRBC transfusion.   Subjective:    [name] today has, No headache, No chest pain, No abdominal pain - Complaints of generalized weakness, reports dyspnea has improved.   Assessment  & Plan :    Active Problems:   CKD (chronic kidney disease) stage 3, GFR 30-59 ml/min   Essential hypertension   Acute respiratory failure with hypoxia (HCC)   Acute on chronic diastolic CHF (congestive heart failure) (HCC)   COPD (chronic obstructive pulmonary disease) (HCC)   Pressure ulcer   Anemia   Acute respiratory failure with hypoxia  - Multifactorial, mainly due to acute on chronic diastolic CHF, as well baseline COPD with exacerbation.  Acute on chronic diastolic CHF - TTE 05/09/16 w/ normal EF and grade 1 DD - not on  ACE-I due to renal function - Continue with IV diuresis, will decrease dose from 40 mg twice a day to once daily given increase in creatinine .  Anemia - Acute blood loss anemia, CT abdomen pelvis significant for right groin/right pelvic retroperitoneal hematoma, continue to monitor, received 2 units PRBC on 7/22, hemoglobin remained stable since, Hemoccult negative . - Continue to  hold aspirin and chemical DVT prophylaxis  AAA  - Patient with finding of 4.4 cm infrarenal abdominal aortic aneurysm , finding discussed with Dr. Myra Gianotti reviewed imaging, recommendation is for outpatient follow-up, he will arrange for patient to follow with him.  Chest pain  cardiac cath 7/20 noted only mild CAD - no more chest pain at this time   Acute COPD exacerbation - Patient with significant wheezing 7/24, significantly improved today, as well she reports significant improvement in her dyspnea, will start with IV steroid taper .  Acute kidney failure on CKD Stage III - hold ACEi/ARB - crt 1.34 - 2.16 earlier this month during admission - crt appears to have stabilized for now - follow as pt post-cath   Possible HCAP - completed 3 days of zosyn - afeb w/ normal WBC -  Recheck CXR in AM   1 cm left upper lobe pulmonary nodule - Will need outpatient PET scan/further evaluation , Patient will continue follow-up with pulmonary Dr. Juanetta Gosling, was discussed with husband and  sons.   Essential HTN BP is better controlled - continue with metoprolol, and hydralazine       Code Status : Full  Family Communication  : None at bedside  Disposition Plan  : Home with home care in 1-2 days  Consults  : cardiology, Pulmonary Dr. Juanetta [facility]ling at [facility]   Procedures  : Cardiac cath 7/20 , 2 units PRBC transfusion 7/22   DVT Prophylaxis  :   SCDs  Lab Results  Component Value Date   PLT 148 (L) 05/30/2016    Antibiotics  :    Anti-infectives    Start     Dose/Rate Route Frequency Ordered Stop    05/25/16 0600  vancomycin (VANCOCIN) 500 mg in sodium chloride 0.9 % 100 mL IVPB  Status:  Discontinued     500 mg 100 mL/hr over 60 Minutes Intravenous Every 24 hours 05/24/16 0848 05/25/16 1637   05/24/16 0900  piperacillin-tazobactam (ZOSYN) IVPB 3.375 g     3.375 g 12.5 mL/hr over 240 Minutes Intravenous Every 8 hours 05/24/16 0845 05/26/16 2104   05/24/16 0900  vancomycin (VANCOCIN) IVPB 1000 mg/200 mL premix     1,000 mg 200 mL/hr over 60 Minutes Intravenous  Once 05/24/16 0846 05/24/16 1027        Objective:   Vitals:   05/29/16 0632 05/29/16 1219 05/29/16 1940 05/30/16 0508  BP:  (!) 175/73 (!) 158/73 (!) 141/64  Pulse:  84 84 66  Resp:  Temp:  98.2 F (36.8 C) 98.6 F (37 C) 97.5 F (36.4 C)  TempSrc:  Oral Oral Oral  SpO2:  98% 97% 96%  Weight: 52.8 kg (116 lb 6.4 oz)   52.9 kg (116 lb 11.2 oz)  Height:        Wt Readings from Last 3 Encounters:  05/30/16 52.9 kg (116 lb 11.2 oz)  05/13/16 56 kg (123 lb 7.3 oz)     Intake/Output Summary (Last 24 hours) at 05/30/16 1205 Last data filed at 05/30/16 1050  Gross per 24 hour  Intake             1040 ml  Output             1402 ml  Net             -362 ml     Physical Exam General: No acute respiratory distress at rest Lungs: Poor air movement bilateral bases - good  air movement . Cardiovascular: Regular rate and rhythm without murmur gallop or rub normal S1 and S2 Abdomen: Nontender, nondistended, soft, bowel sounds positive, no rebound, Significant bruising in abd  including right, left, and hypogastric area, as well right groin Extremities: No significant cyanosis, clubbing, edema bilateral lower extremities    Data Review:    CBC  Recent Labs Lab 05/23/16 1330 05/28/16 0329 05/28/16 0747 05/29/16 0307 05/30/16 0253  WBC 18.8* 7.3 6.5 7.3 6.6  HGB 10.5* 6.4* 6.8* 9.6* 10.4*  HCT 33.3* 21.4* 21.9* 29.7* 32.2*  PLT 186 129* 129* 123* 148*  MCV 87.9 88.8 88.7 85.6 85.2  MCH 27.7 27.4  27.5 27.7 27.5  MCHC 31.5 30.8 31.1 32.3 32.3  RDW 17.1* 17.0* 16.9* 17.3* 16.8*  LYMPHSABS 1.5  --   --   --   --   MONOABS 1.2*  --   --   --   --   EOSABS 0.3  --   --   --   --   BASOSABS 0.0  --   --   --   --     Chemistries   Recent Labs Lab 05/26/16 0354 05/26/16 1317 05/28/16 0329 05/29/16 0307 05/30/16 0253  NA 140 142 142 141 139  K 4.0 3.8 3.5 3.4* 3.7  CL 108 108 107 108 102  CO2 24 26 28 29 28   GLUCOSE 108* 98 97 106* 153*  BUN 56* 49* 49* 39* 39*  CREATININE 2.09* 1.91* 1.75* 1.41* 1.60*  CALCIUM 7.4* 8.0* 7.6* 7.8* 8.2*  AST  --   --  40  --   --   ALT  --   --  32  --   --   ALKPHOS  --   --  38  --   --   BILITOT  --   --  0.4  --   --    ------------------------------------------------------------------------------------------------------------------ No results for input(s): CHOL, HDL, LDLCALC, TRIG, CHOLHDL, LDLDIRECT in the last 72 hours.  No results found for: HGBA1C ------------------------------------------------------------------------------------------------------------------ No results for input(s): TSH, T4TOTAL, T3FREE, THYROIDAB in the last 72 hours.  Invalid input(s): FREET3 ------------------------------------------------------------------------------------------------------------------ No results for input(s): VITAMINB12, FOLATE, FERRITIN, TIBC, IRON, RETICCTPCT in the last 72 hours.  Coagulation profile  Recent Labs Lab 05/23/16 1330  INR 0.98    No results for input(s): DDIMER in the last 72 hours.  Cardiac Enzymes  Recent Labs Lab 05/23/16 1727 05/23/16 2302 05/24/16 0458  TROPONINI 0.03* 0.03* 0.03*   ------------------------------------------------------------------------------------------------------------------    Component Value Date/Time   BNP 1,216.0 (H) 05/23/2016 1330    Inpatient Medications  Scheduled Meds: . sodium chloride   Intravenous Once  . allopurinol  100 mg Oral QHS  . amLODipine  10 mg Oral  Daily  . antiseptic oral rinse  7 mL Mouth Rinse q12n4p  . atorvastatin  80 mg Oral q1800  . chlorhexidine  15 mL Mouth Rinse BID  . furosemide  40 mg Intravenous Daily  . hydrALAZINE  50 mg Oral Q8H  . Living Better with Heart Failure Book   Does not apply Once  . loratadine  10 mg Oral Daily  . methylPREDNISolone (SOLU-MEDROL) injection  40 mg Intravenous Q12H  . metoprolol tartrate  50 mg Oral BID  . pantoprazole  40 mg Oral Daily   Continuous Infusions:  PRN Meds:.acetaminophen, albuterol, guaiFENesin, ondansetron (ZOFRAN) IV  Micro Results Recent Results (from the past 240 hour(s))  MRSA PCR Screening     Status: None  Collection Time: 05/23/16  6:40 PM  Result Value Ref Range Status   MRSA by PCR NEGATIVE NEGATIVE Final    Comment:        The GeneXpert MRSA Assay (FDA approved for NASAL specimens only), is one component of a comprehensive MRSA colonization surveillance program. It is not intended to diagnose MRSA infection nor to guide or monitor treatment for MRSA infections.     Radiology Reports Ct Abdomen Pelvis Wo Contrast  Result Date: 05/29/2016 CLINICAL DATA:  Recent catheterization in the right groin. Anemia and abdominal wall bruising. Evaluation for hematoma. EXAM: CT ABDOMEN AND PELVIS WITHOUT CONTRAST TECHNIQUE: Multidetector CT imaging of the abdomen and pelvis was performed following the standard protocol without IV contrast. COMPARISON:  Renal ultrasound 01/06/2014 FINDINGS: Small bilateral pleural effusions are partially visualized with overlying bilateral lower lobe atelectasis. Three-vessel coronary artery calcification. Mitral annular calcification. The liver, gallbladder, and spleen are unremarkable. There is minimal nodularity of the left adrenal gland. There is marked left renal atrophy with a 4.0 cm low-density lesion extending anteriorly from the interpolar kidney likely representing a cyst. Subcentimeter hyperdense lesions in the upper pole of the  left kidney likely represent hemorrhagic/proteinaceous cysts. A large right upper pole renal cyst measures 10.8 cm. The right kidney is inferiorly located/ectopic and abnormally rotated. A 1.4 cm right renal sinus cyst is noted inferiorly. There is no hydronephrosis. The right adrenal gland is not well seen due to regional distortion by the large renal cyst. A couple of punctate calcifications are noted in the pancreas. There is no evidence of bowel obstruction. There is left-sided colonic diverticulosis without evidence of diverticulitis. Assessment of the pelvis is partially limited by streak artifact from the patient's right total hip arthroplasty. The bladder is grossly unremarkable. The uterus is absent. Stranding is seen in the soft tissues of the right inguinal region consistent with recent catheterization. There is a 3.7 x 3.7 cm hematoma in the right groin. There is also small to moderate volume right-sided retroperitoneal hemorrhage in the pelvis measuring up to 7.1 x 3.7 cm on axial images. There is extensive atherosclerotic calcification of the aorta and its major branch vessels. There is aneurysmal dilatation of the infrarenal abdominal aorta measuring 4.4 x 4.1 cm with displacement of intimal calcifications centrally into the lumen. No enlarged lymph nodes are identified. All multilevel disc and facet degeneration in the lumbar spine. IMPRESSION: 1. Small right groin hematoma and small to moderate sized right-sided pelvic retroperitoneal hematoma. 2. Small bilateral pleural effusions and bibasilar atelectasis. 3. Bilateral renal lesions as above including a large right upper pole cyst. The marked left renal atrophy. 4. 4.4 cm infrarenal abdominal aortic aneurysm. Calcifications central in the lumen may reflect calcified plaque/thrombus although a dissection flap could also give this appearance. These results will be called to the ordering clinician or representative by the Radiologist Assistant, and  communication documented in the PACS or zVision Dashboard. Electronically Signed   By: Sebastian Ache M.D.   On: 05/29/2016 00:46  Dg Chest 1 View  Result Date: 05/08/2016 CLINICAL DATA:  Shortness of breath for 2 weeks. EXAM: CHEST 1 VIEW COMPARISON:  09/16/2010 FINDINGS: The heart size and mediastinal contours are within normal limits. Aortic atherosclerosis noted. Both lungs are clear. No evidence of pulmonary infiltrate or pleural effusion. IMPRESSION: No active disease.  Aortic atherosclerosis noted. Electronically Signed   By: Myles Rosenthal M.D.   On: 05/08/2016 10:14   Ct Head Wo Contrast  Result Date: 05/08/2016 CLINICAL DATA:  Headache, nausea, and photophobia for 1 week. EXAM: CT HEAD WITHOUT CONTRAST TECHNIQUE: Contiguous axial images were obtained from the base of the skull through the vertex without intravenous contrast. COMPARISON:  04/28/2016 FINDINGS: There is no evidence of intracranial hemorrhage, brain edema, or other signs of acute infarction. There is no evidence of intracranial mass lesion or mass effect. No abnormal extraaxial fluid collections are identified. Mild diffuse cerebral atrophy and chronic small vessel disease are stable in appearance. No evidence hydrocephalus. No skull abnormality identified. IMPRESSION: No acute intracranial abnormality. Stable cerebral atrophy and chronic small vessel disease. Electronically Signed   By: Myles Rosenthal M.D.   On: 05/08/2016 10:25   Ct Angio Chest Pe W Or Wo Contrast  Result Date: 05/08/2016 CLINICAL DATA:  Shortness of breath for 1 week. EXAM: CT ANGIOGRAPHY CHEST WITH CONTRAST TECHNIQUE: Multidetector CT imaging of the chest was performed using the standard protocol during bolus administration of intravenous contrast. Multiplanar CT image reconstructions and MIPs were obtained to evaluate the vascular anatomy. CONTRAST:  75 cc Isovue 370 IV COMPARISON:  Radiograph earlier this day. FINDINGS: Cardiovascular: Motion limits detailed assessment  of the pulmonary arteries, particularly of the lower lobes, no evidence of filling defects or pulmonary embolus. Calcified and noncalcified atheromatous plaque throughout the thoracic aorta. No thoracic aortic aneurysm. Some of this plaque appears irregular. There are coronary artery calcifications. Minimal reflux of contrast into the IVC. Mediastinum/Nodes: There is an enlarged right hilar node measuring 1.8 cm short axis. Soft tissue density in the left infrahilar region may be a low left hilar node measuring 1.2 cm. Subcarinal node measures 11 mm. Lower anterior paratracheal node also measures 11 mm. Additional lower paratracheal nodes measuring 10 mm short axis. No pericardial effusion. Lungs/Pleura: In the left upper lobe is a 11 x 10 x 10 mm nodule image 36 series 7. Probable spiculated margins, however are motion artifact through this region. Punctate subpleural nodule in the right lower lobe image 58 series 7. Calcified granuloma in the lung apices. Mild background emphysema. Bronchial thickening particularly in the lower lobes, detailed dependent lower lobe evaluation limited by motion. Small pleural effusion with adjacent densities likely atelectasis. Trachea and proximal bronchi are patent. Probable atelectasis in the medial right middle lobe. There are small bilateral pleural effusions with adjacent compressive atelectasis. Upper Abdomen: Cyst in the right upper quadrant likely renal in origin, and measures at least 8.3 cm, cyst described in this region on ultrasound measured 13 cm. Lobular borders of the spleen. Left adrenal gland tentatively identified without discrete nodule. Right adrenal gland not definitively seen. Tortuosity and atherosclerosis of the upper abdominal aorta. Musculoskeletal: No blastic or destructive lytic lesions. Exaggerated thoracic kyphosis. Review of the MIP images confirms the above findings. IMPRESSION: 1. No pulmonary embolus. 2. Left upper lobe 1 cm pulmonary nodule,  partially obscured by motion, however likely spiculated. Nodule is suspicious for primary bronchogenic malignancy. This is at the size limit for PET-CT characterization. There is an enlarged contralateral right hilar node and prominent mediastinal nodes. 3. Mild emphysema. Bronchial thickening is most prominent in the lower lobes, may be acute or chronic. 4. Small pleural effusions with likely adjacent atelectasis. 5. Thoracic aortic atherosclerosis including coronary artery calcifications. These results will be called to the ordering clinician or representative by the Radiologist Assistant, and communication documented in the PACS or zVision Dashboard. Electronically Signed   By: Rubye Oaks M.D.   On: 05/08/2016 19:03   Dg Chest Port 1 View  Result Date:  05/28/2016 CLINICAL DATA:  Shortness of breath and chest pain. EXAM: PORTABLE CHEST 1 VIEW COMPARISON:  05/24/2016 FINDINGS: There is bibasilar atelectasis and a small left pleural effusion. No overt pulmonary edema. The heart size and mediastinal contours are normal. IMPRESSION: Bibasilar atelectasis with small left pleural effusion. Electronically Signed   By: Irish Lack M.D.   On: 05/28/2016 09:57   Dg Chest Port 1 View  Result Date: 05/24/2016 CLINICAL DATA:  Patient with respiratory failure. EXAM: PORTABLE CHEST 1 VIEW COMPARISON:  Chest radiograph 05/23/2016. FINDINGS: Multiple monitoring leads overlie the patient. Stable cardiomegaly with tortuosity and calcification of the thoracic aorta. Persistent bibasilar airspace opacities. Small bilateral pleural effusions. No pneumothorax. Known 1 cm left upper lobe nodule not well demonstrated on current evaluation. IMPRESSION: Unchanged bibasilar airspace opacities and small bilateral pleural effusions. Aortic vascular calcification. Electronically Signed   By: Annia Belt M.D.   On: 05/24/2016 09:52   Dg Chest Portable 1 View  Result Date: 05/23/2016 CLINICAL DATA:  Shortness of breath  beginning this morning. EXAM: PORTABLE CHEST 1 VIEW COMPARISON:  Single-view of the chest 05/12/2016 and 05/08/2016. CT chest 05/08/2016. FINDINGS: The lungs are emphysematous. Since the most recent examination, right basilar airspace disease has worsened. The patient has a new small left pleural effusion with increased left basilar airspace disease. Small right effusion is noted. No pneumothorax. Heart size is normal. 1 cm left upper lobe nodule seen on the prior chest CT is not visible on this examination. Aortic atherosclerosis is noted. No focal bony abnormality. IMPRESSION: Increased bibasilar airspace disease and small effusions worrisome for pneumonia and/or pulmonary edema. Emphysema. Atherosclerosis. Electronically Signed   By: Drusilla Kanner M.D.   On: 05/23/2016 13:50   Dg Chest Port 1 View  Result Date: 05/12/2016 CLINICAL DATA:  Shortness of breath last night.  COPD EXAM: PORTABLE CHEST 1 VIEW COMPARISON:  05/08/2016 FINDINGS: Normal heart size. No pleural effusion or edema. Aortic atherosclerosis. Moderate right pleural effusion is identified and is increased from 05/08/2016. Chronic interstitial coarsening is noted throughout both lungs. There is diminished aeration to the right base compatible with atelectasis and/or pneumonia. IMPRESSION: 1. New right pleural effusion 2. Worsening aeration to the right lung base. Electronically Signed   By: Signa Kell M.D.   On: 05/12/2016 09:28     Daeja Helderman M.D on 05/30/2016 at 12:05 PM  Between 7am to 7pm - Pager - [phone]  After 7pm go to [email or web address] - password [facility]  [facility] -  Office  [phone]

## 2016-05-30 NOTE — Consult Note (Signed)
   [facility] CM Inpatient Consult   05/30/2016  Sydney Jacobs Jul 18, 1934 [id number]  Patient was evaluated for [facility] Care Management services for frequent admissions and per chart review patient is a 82 yo with history of diastolic CHF, CKD Stage III, and COPD presented withshortness of breath, significant volume overload, and mild chest painsecondary to cough. In the ED, she was noted to be in respiratory distress for which she was started on BiPAP with improvement. CXR with evidence of pleural effusion.    Came by to see the patient and she had just received her lunch explained services and requested that they call the number provided  about her community needs assessment. Husband given brochure and contact information.   For questions, please contact: Charlesetta Shanks, RN BSN CCM [facility]  [phone] business mobile phone Toll free office [phone]

## 2016-05-30 NOTE — Evaluation (Signed)
Physical Therapy Evaluation Patient Details Name: Sydney Jacobs MRN: [id number] DOB: March 04, 1934 Today's Date: 05/30/2016   History of Present Illness  82 yo female with onset of acute respiratory failure with CHF and noted pulmonary nodule with recent transfusion and PNA suspected.  PMHx:  CHF, COPD, CKD 3, pressure ulcer  Clinical Impression  Pt is up to walk with PT and assessed her with no O2:SATURATION QUALIFICATIONS: (This note is used to comply with regulatory documentation for home oxygen)  Patient Saturations on Room Air at Rest = 96%  Patient Saturations on Room Air while Ambulating = 90%  Patient Saturations on 2 Liters of oxygen while Ambulating = 97%  Please briefly explain why patient needs home oxygen:  Pt was able to get off O2 for both steps and walk, and nursing is assessing at rest with no O2 to see if she declines.    Follow Up Recommendations Home health PT;Supervision for mobility/OOB    Equipment Recommendations  None recommended by PT    Recommendations for Other Services Rehab consult     Precautions / Restrictions Precautions Precautions: Fall (telemetry) Restrictions Weight Bearing Restrictions: No      Mobility  Bed Mobility Overal bed mobility: Modified Independent                Transfers Overall transfer level: Needs assistance Equipment used: Rolling walker (2 wheeled);1 person hand held assist Transfers: Sit to/from [employer] Sit to Stand: Min guard Stand pivot transfers: Min guard       General transfer comment: minimal cues for hand placement  Ambulation/Gait Ambulation/Gait assistance: Min guard Ambulation Distance (Feet): 175 Feet Assistive device: 1 person hand held assist Gait Pattern/deviations: Step-through pattern;Wide base of support;Decreased stride length Gait velocity: reduced Gait velocity interpretation: Below normal speed for age/gender General Gait Details: fatigues and has minor speed  changes with gait, PT is steadying her with holding arm  Stairs Stairs: Yes Stairs assistance: Mod assist Stair Management: No rails;Forwards;Alternating pattern Number of Stairs: 3 General stair comments: Pt was min assist to climb steps and mod to descend, HHA as she has no rails at home  Wheelchair Mobility    Modified Rankin (Stroke Patients Only)       Balance Overall balance assessment: Needs assistance Sitting-balance support: Feet supported Sitting balance-Leahy Scale: Good     Standing balance support: Bilateral upper extremity supported Standing balance-Leahy Scale: Fair                               Pertinent Vitals/Pain Pain Assessment: No/denies pain    Home Living Family/patient expects to be discharged to:: Private residence Living Arrangements: Spouse/significant other Available Help at Discharge: Family;Available 24 hours/day Type of Home: House Home Access: Stairs to enter Entrance Stairs-Rails: None Entrance Stairs-Number of Steps: 3 Home Layout: Two level;Able to live on main level with bedroom/bathroom Home Equipment: Dan Humphreys - 2 wheels;Cane - single point      Prior Function Level of Independence: Independent with assistive device(s)               Hand Dominance        Extremity/Trunk Assessment   Upper Extremity Assessment: Overall WFL for tasks assessed           Lower Extremity Assessment: Generalized weakness      Cervical / Trunk Assessment: Normal  Communication   Communication: No difficulties  Cognition Arousal/Alertness: Awake/alert Behavior During Therapy: [facility]  for tasks assessed/performed Overall Cognitive Status: Within Functional Limits for tasks assessed                      General Comments      Exercises        Assessment/Plan    PT Assessment Patient needs continued PT services  PT Diagnosis Difficulty walking   PT Problem List Decreased strength;Decreased range of  motion;Decreased activity tolerance;Decreased balance;Decreased mobility;Decreased coordination;Decreased knowledge of use of DME;Decreased safety awareness;Cardiopulmonary status limiting activity  PT Treatment Interventions Gait training;DME instruction;Stair training;Functional mobility training;Therapeutic activities;Therapeutic exercise;Balance training;Neuromuscular re-education;Patient/family education   PT Goals (Current goals can be found in the Care Plan section) Acute Rehab PT Goals Patient Stated Goal: to get home safely PT Goal Formulation: With patient/family Time For Goal Achievement: 06/06/16 Potential to Achieve Goals: Good    Frequency Min 3X/week   Barriers to discharge Inaccessible home environment steps with HHA    Co-evaluation               End of Session Equipment Utilized During Treatment: Gait belt Activity Tolerance: Patient tolerated treatment well;Patient limited by fatigue Patient left: in chair;with call bell/phone within reach;with chair alarm set;with family/visitor present Nurse Communication: Mobility status         Time: 1012-1038 PT Time Calculation (min) (ACUTE ONLY): 26 min   Charges:   PT Evaluation $PT Eval Low Complexity: 1 Procedure PT Treatments $Gait Training: 8-22 mins   PT G CodesIvar Drape 2016/06/08, 11:35 AM    Samul Dada, PT MS Acute Rehab Dept. Number: [facility] [id number] and [facility] [phone]

## 2016-05-31 LAB — BASIC METABOLIC PANEL
Anion gap: 8 (ref 5–15)
BUN: 48 mg/dL — AB (ref 6–20)
CO2: 31 mmol/L (ref 22–32)
CREATININE: 1.81 mg/dL — AB (ref 0.44–1.00)
Calcium: 8.1 mg/dL — ABNORMAL LOW (ref 8.9–10.3)
Chloride: 102 mmol/L (ref 101–111)
GFR calc Af Amer: 30 mL/min — ABNORMAL LOW (ref >60)
GFR, EST NON AFRICAN AMERICAN: 25 mL/min — AB (ref >60)
GLUCOSE: 135 mg/dL — AB (ref 65–99)
POTASSIUM: 3.6 mmol/L (ref 3.5–5.1)
SODIUM: 141 mmol/L (ref 135–145)

## 2016-05-31 LAB — CBC
HEMATOCRIT: 28.7 % — AB (ref 36.0–46.0)
Hemoglobin: 9.1 g/dL — ABNORMAL LOW (ref 12.0–15.0)
MCH: 27.5 pg (ref 26.0–34.0)
MCHC: 31.7 g/dL (ref 30.0–36.0)
MCV: 86.7 fL (ref 78.0–100.0)
PLATELETS: 177 10*3/uL (ref 150–400)
RBC: 3.31 MIL/uL — ABNORMAL LOW (ref 3.87–5.11)
RDW: 17.1 % — AB (ref 11.5–15.5)
WBC: 8.8 10*3/uL (ref 4.0–10.5)

## 2016-05-31 MED ORDER — HYDRALAZINE HCL 50 MG PO TABS
50.0000 mg | ORAL_TABLET | Freq: Four times a day (QID) | ORAL | Status: DC
  Administered 2016-05-31 – 2016-06-03 (×11): 50 mg via ORAL
  Filled 2016-05-31 (×11): qty 1

## 2016-05-31 MED ORDER — PREDNISONE 20 MG PO TABS
40.0000 mg | ORAL_TABLET | Freq: Every day | ORAL | Status: DC
  Administered 2016-06-01 – 2016-06-02 (×2): 40 mg via ORAL
  Filled 2016-05-31 (×2): qty 2

## 2016-05-31 MED ORDER — FUROSEMIDE 40 MG PO TABS
40.0000 mg | ORAL_TABLET | Freq: Every day | ORAL | Status: DC
  Administered 2016-05-31 – 2016-06-02 (×3): 40 mg via ORAL
  Filled 2016-05-31 (×3): qty 1

## 2016-05-31 NOTE — Progress Notes (Signed)
PROGRESS NOTE                                                                                                                                                                                                             Patient Demographics:    Sydney Jacobs, is a 82 y.o. female, DOB - 02-Jul-1934, DCV:[id number]  Admit date - 05/23/2016   Admitting Physician Ozella Rocks, MD  Outpatient Primary MD for the patient is Colette Ribas, MD  LOS - 8  Chief Complaint  Patient presents with  . Shortness of Breath  . Chest Pain       Brief Narrative  82 yo f with history of diastolic CHF, CKD Stage III, and COPD presented with shortness of breath, significant volume overload, and mild chest pain secondary to cough. In the ED, she was noted to be in respiratory distress for which she was started on BiPAP with improvement. CXR with evidence of pleural effusion and BNP noted to be higher than prior admission, Cardiac cath on 7/20 significant for mild CAD, significant anemia on 7/22, workup significant for right groin/right pelvic retroperitoneal hematoma, stable after transfusion,hemoglobin remained stable after 2 units PRBC transfusion, Improving dyspnea with diuresis, creatinine started to increase.   Subjective:    Sydney Jacobs today has, No headache, No chest pain, No abdominal pain - Complaints of generalized weakness, reports dyspnea has improved.   Assessment  & Plan :    Active Problems:   CKD (chronic kidney disease) stage 3, GFR 30-59 ml/min   Essential hypertension   Acute respiratory failure with hypoxia (HCC)   Acute on chronic diastolic CHF (congestive heart failure) (HCC)   COPD (chronic obstructive pulmonary disease) (HCC)   Pressure ulcer   Anemia   Acute respiratory failure with hypoxia  - Multifactorial, mainly due to acute on chronic diastolic CHF, as well baseline COPD with exacerbation. - Significantly improving with  diuresis.  Acute on chronic diastolic CHF - TTE 05/09/16 w/ normal EF and grade 1 DD - not on ACE-I due to renal function - Significantly improved with IV diuresis , - 5818 ml since admission , but urine output slowing down after decreasing IV diuresis can due  to worsening renal function ( only  -359 mL over last 24 hours and actually 1 pound weight gain ), creatinine is  1.8 today, so we'll change to Lasix 40 mg oral daily, and observe if renal function remained stable in a.m..  Anemia - Acute blood loss anemia, CT abdomen pelvis significant for right groin/right pelvic retroperitoneal hematoma, continue to monitor, received 2 units PRBC on 7/22, hemoglobin remained stable since, Hemoccult negative . - Continue to hold aspirin and chemical DVT prophylaxis  Right groin hematoma/retroperitoneal bleed - Mildly been stable since transfusion, repeat CBC in a.m., may resume aspirin in 7 days if CBC remained stable as an outpatient per cards.  AAA  - Patient with finding of 4.4 cm infrarenal abdominal aortic aneurysm , finding discussed with Dr. Myra Gianotti reviewed imaging, recommendation is for outpatient follow-up, he will arrange for patient to follow with him.  Chest pain  cardiac cath 7/20 noted only mild CAD - no more chest pain at this time   Acute COPD exacerbation - Patient with significant wheezing 7/24, significantly improved today, as well she reports significant improvement in her dyspnea, improving, will transition to by mouth steroids in a.m.  Acute kidney failure on CKD Stage III - hold ACEi/ARB - crt 1.34 - 2.16 earlier this month during admission - follow as pt post-cath , creatinine gradually increasing, today is 1.8, decreased Lasix dose.  Possible HCAP - completed 3 days of zosyn - afeb w/ normal WBC -  Recheck CXR in AM   1 cm left upper lobe pulmonary nodule - Will need outpatient PET scan/further evaluation , Patient will continue follow-up with pulmonary Dr. Juanetta Gosling, was  discussed with husband and  sons.   Essential HTN BP is better controlled - continue with metoprolol, and hydralazine       Code Status : Full  Family Communication  : None at bedside  Disposition Plan  : Home with home care in a.m. renal function and hemoglobin remained stable  Consults  : cardiology, Pulmonary Dr. Juanetta Gosling at [facility]   Procedures  : Cardiac cath 7/20 , 2 units PRBC transfusion 7/22   DVT Prophylaxis  :   SCDs  Lab Results  Component Value Date   PLT 177 05/31/2016    Antibiotics  :    Anti-infectives    Start     Dose/Rate Route Frequency Ordered Stop   05/25/16 0600  vancomycin (VANCOCIN) 500 mg in sodium chloride 0.9 % 100 mL IVPB  Status:  Discontinued     500 mg 100 mL/hr over 60 Minutes Intravenous Every 24 hours 05/24/16 0848 05/25/16 1637   05/24/16 0900  piperacillin-tazobactam (ZOSYN) IVPB 3.375 g     3.375 g 12.5 mL/hr over 240 Minutes Intravenous Every 8 hours 05/24/16 0845 05/26/16 2104   05/24/16 0900  vancomycin (VANCOCIN) IVPB 1000 mg/200 mL premix     1,000 mg 200 mL/hr over 60 Minutes Intravenous  Once 05/24/16 0846 05/24/16 1027        Objective:   Vitals:   05/30/16 1320 05/30/16 1957 05/31/16 0453 05/31/16 1304  BP:  (!) 170/61 (!) 155/69 (!) 141/61  Pulse:  86 81 74  Resp:  18 18 18   Temp:  98.5 F (36.9 C) 97.8 F (36.6 C) 98.4 F (36.9 C)  TempSrc:  Oral Oral Oral  SpO2: 95% 96% 94% 97%  Weight:   53.4 kg (117 lb 11.2 oz)   Height:        Wt Readings from Last 3 Encounters:  05/31/16 53.4 kg (117 lb 11.2 oz)  05/13/16 56 kg (123 lb 7.3 oz)  Intake/Output Summary (Last 24 hours) at 05/31/16 1531 Last data filed at 05/31/16 1426  Gross per 24 hour  Intake             1182 ml  Output             1200 ml  Net              -18 ml     Physical Exam General: No acute respiratory distress at rest Lungs: Poor air movement bilateral bases - good air movement . Cardiovascular: Regular rate and rhythm  without murmur gallop or rub normal S1 and S2 Abdomen: Nontender, nondistended, soft, bowel sounds positive, no rebound, Significant bruising in abd  including right, left, and hypogastric area, as well right groin Extremities: No significant cyanosis, clubbing, edema bilateral lower extremities    Data Review:    CBC  Recent Labs Lab 05/28/16 0329 05/28/16 0747 05/29/16 0307 05/30/16 0253 05/31/16 0315  WBC 7.3 6.5 7.3 6.6 8.8  HGB 6.4* 6.8* 9.6* 10.4* 9.1*  HCT 21.4* 21.9* 29.7* 32.2* 28.7*  PLT 129* 129* 123* 148* 177  MCV 88.8 88.7 85.6 85.2 86.7  MCH 27.4 27.5 27.7 27.5 27.5  MCHC 30.8 31.1 32.3 32.3 31.7  RDW 17.0* 16.9* 17.3* 16.8* 17.1*    Chemistries   Recent Labs Lab 05/26/16 1317 05/28/16 0329 05/29/16 0307 05/30/16 0253 05/31/16 0315  NA 142 142 141 139 141  K 3.8 3.5 3.4* 3.7 3.6  CL 108 107 108 102 102  CO2 26 28 29 28 31   GLUCOSE 98 97 106* 153* 135*  BUN 49* 49* 39* 39* 48*  CREATININE 1.91* 1.75* 1.41* 1.60* 1.81*  CALCIUM 8.0* 7.6* 7.8* 8.2* 8.1*  AST  --  40  --   --   --   ALT  --  32  --   --   --   ALKPHOS  --  38  --   --   --   BILITOT  --  0.4  --   --   --    ------------------------------------------------------------------------------------------------------------------ No results for input(s): CHOL, HDL, LDLCALC, TRIG, CHOLHDL, LDLDIRECT in the last 72 hours.  No results found for: HGBA1C ------------------------------------------------------------------------------------------------------------------ No results for input(s): TSH, T4TOTAL, T3FREE, THYROIDAB in the last 72 hours.  Invalid input(s): FREET3 ------------------------------------------------------------------------------------------------------------------ No results for input(s): VITAMINB12, FOLATE, FERRITIN, TIBC, IRON, RETICCTPCT in the last 72 hours.  Coagulation profile No results for input(s): INR, PROTIME in the last 168 hours.  No results for input(s):  DDIMER in the last 72 hours.  Cardiac Enzymes No results for input(s): CKMB, TROPONINI, MYOGLOBIN in the last 168 hours.  Invalid input(s): CK ------------------------------------------------------------------------------------------------------------------    Component Value Date/Time   BNP 1,216.0 (H) 05/23/2016 1330    Inpatient Medications  Scheduled Meds: . sodium chloride   Intravenous Once  . allopurinol  100 mg Oral QHS  . amLODipine  10 mg Oral Daily  . antiseptic oral rinse  7 mL Mouth Rinse q12n4p  . atorvastatin  80 mg Oral q1800  . chlorhexidine  15 mL Mouth Rinse BID  . furosemide  40 mg Oral Daily  . hydrALAZINE  50 mg Oral Q6H  . Living Better with Heart Failure Book   Does not apply Once  . loratadine  10 mg Oral Daily  . methylPREDNISolone (SOLU-MEDROL) injection  40 mg Intravenous Q12H  . metoprolol tartrate  50 mg Oral BID  . pantoprazole  40 mg Oral Daily   Continuous Infusions:  PRN Meds:.acetaminophen, albuterol, guaiFENesin, ondansetron (ZOFRAN) IV  Micro Results Recent Results (from the past 240 hour(s))  MRSA PCR Screening     Status: None   Collection Time: 05/23/16  6:40 PM  Result Value Ref Range Status   MRSA by PCR NEGATIVE NEGATIVE Final    Comment:        The GeneXpert MRSA Assay (FDA approved for NASAL specimens only), is one component of a comprehensive MRSA colonization surveillance program. It is not intended to diagnose MRSA infection nor to guide or monitor treatment for MRSA infections.     Radiology Reports Ct Abdomen Pelvis Wo Contrast  Result Date: 05/29/2016 CLINICAL DATA:  Recent catheterization in the right groin. Anemia and abdominal wall bruising. Evaluation for hematoma. EXAM: CT ABDOMEN AND PELVIS WITHOUT CONTRAST TECHNIQUE: Multidetector CT imaging of the abdomen and pelvis was performed following the standard protocol without IV contrast. COMPARISON:  Renal ultrasound 01/06/2014 FINDINGS: Small bilateral pleural  effusions are partially visualized with overlying bilateral lower lobe atelectasis. Three-vessel coronary artery calcification. Mitral annular calcification. The liver, gallbladder, and spleen are unremarkable. There is minimal nodularity of the left adrenal gland. There is marked left renal atrophy with a 4.0 cm low-density lesion extending anteriorly from the interpolar kidney likely representing a cyst. Subcentimeter hyperdense lesions in the upper pole of the left kidney likely represent hemorrhagic/proteinaceous cysts. A large right upper pole renal cyst measures 10.8 cm. The right kidney is inferiorly located/ectopic and abnormally rotated. A 1.4 cm right renal sinus cyst is noted inferiorly. There is no hydronephrosis. The right adrenal gland is not well seen due to regional distortion by the large renal cyst. A couple of punctate calcifications are noted in the pancreas. There is no evidence of bowel obstruction. There is left-sided colonic diverticulosis without evidence of diverticulitis. Assessment of the pelvis is partially limited by streak artifact from the patient's right total hip arthroplasty. The bladder is grossly unremarkable. The uterus is absent. Stranding is seen in the soft tissues of the right inguinal region consistent with recent catheterization. There is a 3.7 x 3.7 cm hematoma in the right groin. There is also small to moderate volume right-sided retroperitoneal hemorrhage in the pelvis measuring up to 7.1 x 3.7 cm on axial images. There is extensive atherosclerotic calcification of the aorta and its major branch vessels. There is aneurysmal dilatation of the infrarenal abdominal aorta measuring 4.4 x 4.1 cm with displacement of intimal calcifications centrally into the lumen. No enlarged lymph nodes are identified. All multilevel disc and facet degeneration in the lumbar spine. IMPRESSION: 1. Small right groin hematoma and small to moderate sized right-sided pelvic retroperitoneal  hematoma. 2. Small bilateral pleural effusions and bibasilar atelectasis. 3. Bilateral renal lesions as above including a large right upper pole cyst. The marked left renal atrophy. 4. 4.4 cm infrarenal abdominal aortic aneurysm. Calcifications central in the lumen may reflect calcified plaque/thrombus although a dissection flap could also give this appearance. These results will be called to the ordering clinician or representative by the Radiologist Assistant, and communication documented in the PACS or zVision Dashboard. Electronically Signed   By: Sebastian Ache M.D.   On: 05/29/2016 00:46  Dg Chest 1 View  Result Date: 05/08/2016 CLINICAL DATA:  Shortness of breath for 2 weeks. EXAM: CHEST 1 VIEW COMPARISON:  09/16/2010 FINDINGS: The heart size and mediastinal contours are within normal limits. Aortic atherosclerosis noted. Both lungs are clear. No evidence of pulmonary infiltrate or pleural effusion. IMPRESSION: No active disease.  Aortic atherosclerosis noted. Electronically Signed   By: Myles Rosenthal M.D.   On: 05/08/2016 10:14   Ct Head Wo Contrast  Result Date: 05/08/2016 CLINICAL DATA:  Headache, nausea, and photophobia for 1 week. EXAM: CT HEAD WITHOUT CONTRAST TECHNIQUE: Contiguous axial images were obtained from the base of the skull through the vertex without intravenous contrast. COMPARISON:  04/28/2016 FINDINGS: There is no evidence of intracranial hemorrhage, brain edema, or other signs of acute infarction. There is no evidence of intracranial mass lesion or mass effect. No abnormal extraaxial fluid collections are identified. Mild diffuse cerebral atrophy and chronic small vessel disease are stable in appearance. No evidence hydrocephalus. No skull abnormality identified. IMPRESSION: No acute intracranial abnormality. Stable cerebral atrophy and chronic small vessel disease. Electronically Signed   By: Myles Rosenthal M.D.   On: 05/08/2016 10:25   Ct Angio Chest Pe W Or Wo Contrast  Result Date:  05/08/2016 CLINICAL DATA:  Shortness of breath for 1 week. EXAM: CT ANGIOGRAPHY CHEST WITH CONTRAST TECHNIQUE: Multidetector CT imaging of the chest was performed using the standard protocol during bolus administration of intravenous contrast. Multiplanar CT image reconstructions and MIPs were obtained to evaluate the vascular anatomy. CONTRAST:  75 cc Isovue 370 IV COMPARISON:  Radiograph earlier this day. FINDINGS: Cardiovascular: Motion limits detailed assessment of the pulmonary arteries, particularly of the lower lobes, no evidence of filling defects or pulmonary embolus. Calcified and noncalcified atheromatous plaque throughout the thoracic aorta. No thoracic aortic aneurysm. Some of this plaque appears irregular. There are coronary artery calcifications. Minimal reflux of contrast into the IVC. Mediastinum/Nodes: There is an enlarged right hilar node measuring 1.8 cm short axis. Soft tissue density in the left infrahilar region may be a low left hilar node measuring 1.2 cm. Subcarinal node measures 11 mm. Lower anterior paratracheal node also measures 11 mm. Additional lower paratracheal nodes measuring 10 mm short axis. No pericardial effusion. Lungs/Pleura: In the left upper lobe is a 11 x 10 x 10 mm nodule image 36 series 7. Probable spiculated margins, however are motion artifact through this region. Punctate subpleural nodule in the right lower lobe image 58 series 7. Calcified granuloma in the lung apices. Mild background emphysema. Bronchial thickening particularly in the lower lobes, detailed dependent lower lobe evaluation limited by motion. Small pleural effusion with adjacent densities likely atelectasis. Trachea and proximal bronchi are patent. Probable atelectasis in the medial right middle lobe. There are small bilateral pleural effusions with adjacent compressive atelectasis. Upper Abdomen: Cyst in the right upper quadrant likely renal in origin, and measures at least 8.3 cm, cyst described in  this region on ultrasound measured 13 cm. Lobular borders of the spleen. Left adrenal gland tentatively identified without discrete nodule. Right adrenal gland not definitively seen. Tortuosity and atherosclerosis of the upper abdominal aorta. Musculoskeletal: No blastic or destructive lytic lesions. Exaggerated thoracic kyphosis. Review of the MIP images confirms the above findings. IMPRESSION: 1. No pulmonary embolus. 2. Left upper lobe 1 cm pulmonary nodule, partially obscured by motion, however likely spiculated. Nodule is suspicious for primary bronchogenic malignancy. This is at the size limit for PET-CT characterization. There is an enlarged contralateral right hilar node and prominent mediastinal nodes. 3. Mild emphysema. Bronchial thickening is most prominent in the lower lobes, may be acute or chronic. 4. Small pleural effusions with likely adjacent atelectasis. 5. Thoracic aortic atherosclerosis including coronary artery calcifications. These results will be called to the ordering clinician or representative by the Radiologist Assistant, and communication documented  in the PACS or zVision Dashboard. Electronically Signed   By: Rubye Oaks M.D.   On: 05/08/2016 19:03   Dg Chest Port 1 View  Result Date: 05/28/2016 CLINICAL DATA:  Shortness of breath and chest pain. EXAM: PORTABLE CHEST 1 VIEW COMPARISON:  05/24/2016 FINDINGS: There is bibasilar atelectasis and a small left pleural effusion. No overt pulmonary edema. The heart size and mediastinal contours are normal. IMPRESSION: Bibasilar atelectasis with small left pleural effusion. Electronically Signed   By: Irish Lack M.D.   On: 05/28/2016 09:57   Dg Chest Port 1 View  Result Date: 05/24/2016 CLINICAL DATA:  Patient with respiratory failure. EXAM: PORTABLE CHEST 1 VIEW COMPARISON:  Chest radiograph 05/23/2016. FINDINGS: Multiple monitoring leads overlie the patient. Stable cardiomegaly with tortuosity and calcification of the thoracic  aorta. Persistent bibasilar airspace opacities. Small bilateral pleural effusions. No pneumothorax. Known 1 cm left upper lobe nodule not well demonstrated on current evaluation. IMPRESSION: Unchanged bibasilar airspace opacities and small bilateral pleural effusions. Aortic vascular calcification. Electronically Signed   By: Annia Belt M.D.   On: 05/24/2016 09:52   Dg Chest Portable 1 View  Result Date: 05/23/2016 CLINICAL DATA:  Shortness of breath beginning this morning. EXAM: PORTABLE CHEST 1 VIEW COMPARISON:  Single-view of the chest 05/12/2016 and 05/08/2016. CT chest 05/08/2016. FINDINGS: The lungs are emphysematous. Since the most recent examination, right basilar airspace disease has worsened. The patient has a new small left pleural effusion with increased left basilar airspace disease. Small right effusion is noted. No pneumothorax. Heart size is normal. 1 cm left upper lobe nodule seen on the prior chest CT is not visible on this examination. Aortic atherosclerosis is noted. No focal bony abnormality. IMPRESSION: Increased bibasilar airspace disease and small effusions worrisome for pneumonia and/or pulmonary edema. Emphysema. Atherosclerosis. Electronically Signed   By: Drusilla Kanner M.D.   On: 05/23/2016 13:50   Dg Chest Port 1 View  Result Date: 05/12/2016 CLINICAL DATA:  Shortness of breath last night.  COPD EXAM: PORTABLE CHEST 1 VIEW COMPARISON:  05/08/2016 FINDINGS: Normal heart size. No pleural effusion or edema. Aortic atherosclerosis. Moderate right pleural effusion is identified and is increased from 05/08/2016. Chronic interstitial coarsening is noted throughout both lungs. There is diminished aeration to the right base compatible with atelectasis and/or pneumonia. IMPRESSION: 1. New right pleural effusion 2. Worsening aeration to the right lung base. Electronically Signed   By: Signa Kell M.D.   On: 05/12/2016 09:28     Kathya Wilz M.D on 05/31/2016 at 3:31  PM  Between 7am to 7pm - Pager - [phone]  After 7pm go to [email or web address] - password [facility]  [facility] -  Office  [phone]

## 2016-05-31 NOTE — Care Management Note (Signed)
Case Management Note  Patient Details  Name: Sydney Jacobs MRN: [id number] Date of Birth: [birth year]  Subjective/Objective:    Admitted with Resp Failure                Action/Plan: Patient lives at home with her spouse; PCP is Colette Ribas, MD; has private insurance with [employer] with prescription drug coverage; pharmacy of choice is [facility] ; patient reports no problem getting her medication. DME - she has a walker and cane at home, no home oxygen at this time. Pt was recommended for [facility] with [facility] ;Referral also made to [facility] COPD program.  Expected Discharge Date:    possibly 06/03/2016              Expected Discharge Plan:  Home w Home Health Services  In-House Referral:   [facility]  Discharge planning Services  CM Consult  HH Arranged:  RN, Disease Management, PT HH Agency:  [facility]  Status of Service:  In process, will continue to follow  If discussed at Long Length of Stay Meetings, dates discussed:  05/31/2016  Reola Mosher [phone] 05/31/2016, 11:38 AM

## 2016-05-31 NOTE — Care Management Note (Deleted)
Case Management Note  Patient Details  Name: Sydney Jacobs MRN: [id number] Date of Birth: 1934-07-29  Subjective/Objective:   Admitted with Acute Resp Failure                 Action/Plan: Patient lives at home with her spouse; PCP is Colette Ribas, MD; has private insurance with [employer] with prescription drug coverage; pharmacy of choice is [facility] ; patient reports no problem getting her medication. DME - she has a walker and cane at home, no home oxygen at this time. Pt was recommended for [facility]HC with [facility]; HHC choice offered to pt/spouse; patient requested [facility] ( she used them with her hip surgery in the past and wants to use them again). Mary with [facility] ( [facility]) made aware of referral. Referral also made to [facility] COPD program.  Expected Discharge Date: Possibly 06/01/2016                 Expected Discharge Plan:  Home w Home Health Services  In-House Referral:   [facility]  Discharge planning Services  CM Consult   HH Arranged:  RN, Disease Management, PT HH Agency:  [facility] (now [facility])  Status of Service:  In process, will continue to follow  If discussed at Long Length of Stay Meetings, dates discussed:  05/31/2016  Reola Mosher  05/31/2016, 10:04 AM

## 2016-06-01 ENCOUNTER — Other Ambulatory Visit: Payer: Self-pay | Admitting: *Deleted

## 2016-06-01 ENCOUNTER — Inpatient Hospital Stay (HOSPITAL_COMMUNITY): Payer: Commercial Managed Care - HMO

## 2016-06-01 DIAGNOSIS — I714 Abdominal aortic aneurysm, without rupture, unspecified: Secondary | ICD-10-CM

## 2016-06-01 DIAGNOSIS — D649 Anemia, unspecified: Secondary | ICD-10-CM

## 2016-06-01 LAB — BASIC METABOLIC PANEL
ANION GAP: 9 (ref 5–15)
BUN: 56 mg/dL — ABNORMAL HIGH (ref 6–20)
CALCIUM: 8.3 mg/dL — AB (ref 8.9–10.3)
CHLORIDE: 100 mmol/L — AB (ref 101–111)
CO2: 30 mmol/L (ref 22–32)
Creatinine, Ser: 2.13 mg/dL — ABNORMAL HIGH (ref 0.44–1.00)
GFR calc non Af Amer: 21 mL/min — ABNORMAL LOW (ref >60)
GFR, EST AFRICAN AMERICAN: 24 mL/min — AB (ref >60)
Glucose, Bld: 131 mg/dL — ABNORMAL HIGH (ref 65–99)
Potassium: 3.6 mmol/L (ref 3.5–5.1)
SODIUM: 139 mmol/L (ref 135–145)

## 2016-06-01 LAB — CBC
HEMATOCRIT: 27.1 % — AB (ref 36.0–46.0)
HEMOGLOBIN: 8.8 g/dL — AB (ref 12.0–15.0)
MCH: 28.1 pg (ref 26.0–34.0)
MCHC: 32.5 g/dL (ref 30.0–36.0)
MCV: 86.6 fL (ref 78.0–100.0)
Platelets: 185 10*3/uL (ref 150–400)
RBC: 3.13 MIL/uL — AB (ref 3.87–5.11)
RDW: 17.4 % — ABNORMAL HIGH (ref 11.5–15.5)
WBC: 8.3 10*3/uL (ref 4.0–10.5)

## 2016-06-01 NOTE — Progress Notes (Signed)
Physical Therapy Treatment Patient Details Name: Sydney Jacobs MRN: [id number] DOB: 03/20/1934 Today's Date: 06/01/2016    History of Present Illness 82 yo female with onset of acute respiratory failure with CHF and noted pulmonary nodule with recent transfusion and PNA suspected.  PMHx:  CHF, COPD, CKD 3, pressure ulcer    PT Comments    Pt was able to walk with O2 today, had dropped and needed since yesterday so went on walk with it.  Review of there ex was helpful as pt is hoping still to go directly home.  Her husband will be able to care for her and HHPT still expected as she is scheduled to try to wean O2 as able for home.  Follow Up Recommendations  Home health PT;Supervision for mobility/OOB     Equipment Recommendations  None recommended by PT    Recommendations for Other Services Rehab consult     Precautions / Restrictions Precautions Precautions: Fall Precaution Comments: telemetry Restrictions Weight Bearing Restrictions: No    Mobility  Bed Mobility               General bed mobility comments: up in chair when PT arrived  Transfers Overall transfer level: Needs assistance Equipment used: 1 person hand held assist (O2 tank) Transfers: Sit to/from Stand;Stand Pivot Transfers Sit to Stand: Min guard Stand pivot transfers: Min guard          Ambulation/Gait Ambulation/Gait assistance: Min guard Ambulation Distance (Feet): 225 Feet Assistive device: 1 person hand held assist Gait Pattern/deviations: Step-through pattern;Narrow base of support;Decreased stride length Gait velocity: reduced Gait velocity interpretation: Below normal speed for age/gender General Gait Details: better turning control with tank air and PT as safety feature with mobility   Stairs            Wheelchair Mobility    Modified Rankin (Stroke Patients Only)       Balance     Sitting balance-Leahy Scale: Good       Standing balance-Leahy Scale: Fair                      Cognition Arousal/Alertness: Awake/alert Behavior During Therapy: WFL for tasks assessed/performed Overall Cognitive Status: Within Functional Limits for tasks assessed                      Exercises General Exercises - Lower Extremity Ankle Circles/Pumps: AROM;Both;5 reps Quad Sets: AROM;Both;15 reps Gluteal Sets: AROM;Both;15 reps Long Arc Quad: Strengthening;Both;10 reps Heel Slides: Strengthening;Both;10 reps Hip ABduction/ADduction: AROM;Both;15 reps    General Comments        Pertinent Vitals/Pain Pain Assessment: No/denies pain    Home Living                      Prior Function            PT Goals (current goals can now be found in the care plan section) Acute Rehab PT Goals Patient Stated Goal: to get home safely    Frequency  Min 3X/week    PT Plan Current plan remains appropriate    Co-evaluation             End of Session Equipment Utilized During Treatment: Gait belt;Oxygen Activity Tolerance: Patient tolerated treatment well (O2 sats were 96% and pulse 92 end of gait) Patient left: in chair;with call bell/phone within reach     Time: 4098-1191 PT Time Calculation (min) (ACUTE ONLY): 26 min  Charges:  $  Gait Training: 8-22 mins $Therapeutic Exercise: 8-22 mins                    G Codes:      Ivar Drape 06-06-16, 11:16 AM    Samul Dada, PT MS Acute Rehab Dept. Number: [facility] [id number] and [facility] [phone]

## 2016-06-01 NOTE — Progress Notes (Signed)
PROGRESS NOTE    Sydney Jacobs  ZOX:[id number] DOB: [birth year] DOA: 05/23/2016 PCP: Colette Ribas, MD   Brief Narrative:  HPI on 05/23/2016 by Dr. Erick Blinks Sydney Jacobs is a 82 y.o. female with medical history significant of diastolic chf, CKD 3, and COPD. Patient was recently discharged from the hospital on 7/7 after being treated for shortness of breath which was felt to be related to a combination of COPD and CHF. She has undergone diuresis during that hospitalization and had developed renal Jacobs. On discharge, she was sent home on a prednisone taper, bronchodilators and beta blockers. It does not appear that diuretics were included in her discharge meds, likely due to recovering renal function. She says that since her discharge, she has been noticing worsening lower extremity edema. She has had significant orthopnea and has been unable to sleep flat. She has some central chest pain, which only occurs while coughing. This morning, she woke up to worsening shortness of breath, wheezing and came to the ED for evaluation.   Interim history Cardiac cath on 7/20 significant for mild CAD, significant anemia on 7/22, workup significant for right groin/right pelvic retroperitoneal hematoma, stable after transfusion,hemoglobin remained stable after 2 units PRBC transfusion, Improving dyspnea with diuresis, creatinine started to increase.  Assessment & Plan   Acute respiratory Jacobs with hypoxia -Multifactorial, secondary to acute on chronic Sydney Jacobs, COPD exacerbation -Appears to be improving mildly with diuresis however patient did have a positive fluid balance 05/31/2016 -Will place her on fluid restriction and continue to monitor closely -Patient does not use home oxygen. Will have saturations monitored at rest and with ambulation  Acute on chronic diastolic heart Jacobs -Echocardiogram 05/09/2016 showed normal EF, grade 1 diastolic dysfunction -Currently not on  ACE inhibitor due to renal function -Improved with IV diuresis, however patient was transitioned to oral Lasix -Continue to monitor intake and output, daily weights -Patient did have positive fluid balance yesterday, will place her on fluid restriction  Anemia, secondary to blood loss -CT abdomen and pelvis significant for right groin/right pelvic retroperitoneal hematoma -Patient received 2 units PRBC on 05/28/2016 -Hemoccult negative -Aspirin chemical DVT prophylaxis held -Hemoglobin currently 8.8, continue to monitor CBC  Right groin hematoma/retroperitoneal bleed -Treatment plan as above, resume aspirin 7 days of hemoglobin remained stable as an outpatient, per cardiology  AAA -4.47 m infrarenal abdominal aortic aneurysm found on imaging -Previous hospitalist discussed with Dr. Myra Gianotti, recommended outpatient follow-up  Chest pain -Patient did have cardiac catheterization on 05/26/2016, only mild CAD noted. Currently chest pain-free  Acute COPD exacerbation -Continue steroids and breathing treatments  Acute on chronic kidney disease, stage III -ACE inhibitor/RB currently held -Creatinine 2.13 today -Continue to monitor BMP  Possible HCAP -Patient completed 3 days of Zosyn, currently afebrile with normal white blood cell count -Repeat chest x-ray  Left upper lobe pulmonary nodule -Found on imaging, patient will need outpatient PET scan for further evaluation. Follow-up with Dr. Juanetta Gosling  Essential hypertension -Controlled, continue metoprolol and hydralazine  DVT Prophylaxis  SCDs  Code Status: Full  Family Communication: Husband at bedside  Disposition Plan: Admitted. Possibly discharge within 24-48 hours  Consultants Cardiology Pulmonology (Dr. Juanetta Gosling at [facility]) Dr. Myra Gianotti  Procedures  Cardiac catheterization   Antibiotics   Anti-infectives    Start     Dose/Rate Route Frequency Ordered Stop   05/25/16 0600  vancomycin (VANCOCIN) 500 mg in sodium  chloride 0.9 % 100 mL IVPB  Status:  Discontinued  500 mg 100 mL/hr over 60 Minutes Intravenous Every 24 hours 05/24/16 0848 05/25/16 1637   05/24/16 0900  piperacillin-tazobactam (ZOSYN) IVPB 3.375 g     3.375 g 12.5 mL/hr over 240 Minutes Intravenous Every 8 hours 05/24/16 0845 05/26/16 2104   05/24/16 0900  vancomycin (VANCOCIN) IVPB 1000 mg/200 mL premix     1,000 mg 200 mL/hr over 60 Minutes Intravenous  Once 05/24/16 0846 05/24/16 1027      Subjective:   Sydney Jacobs seen and examined today.  Patient states that she was mildly short of breath yesterday evening when walking to the bathroom. Currently denies any chest pain, abdominal pain, nausea or vomiting, headache or dizziness.  Objective:   Vitals:   05/31/16 0453 05/31/16 1304 05/31/16 2020 06/01/16 0539  BP: (!) 155/69 (!) 141/61 (!) 142/52 (!) 154/68  Pulse: 81 74 77 75  Resp: 18 18 18 18   Temp: 97.8 F (36.6 C) 98.4 F (36.9 C) 98.1 F (36.7 C) 98 F (36.7 C)  TempSrc: Oral Oral Oral Oral  SpO2: 94% 97% 96% 95%  Weight: 53.4 kg (117 lb 11.2 oz)   53.7 kg (118 lb 4.8 oz)  Height:        Intake/Output Summary (Last 24 hours) at 06/01/16 1140 Last data filed at 06/01/16 0951  Gross per 24 hour  Intake             1302 ml  Output             1040 ml  Net              262 ml   Filed Weights   05/30/16 0508 05/31/16 0453 06/01/16 0539  Weight: 52.9 kg (116 lb 11.2 oz) 53.4 kg (117 lb 11.2 oz) 53.7 kg (118 lb 4.8 oz)    Exam  General: Well developed, well nourished, NAD, appears stated age  HEENT: NCAT, mucous membranes moist.   Cardiovascular: S1 S2 auscultated, no rubs, murmurs or gallops. Regular rate and rhythm.  Respiratory: Clear to auscultation bilaterally with equal chest rise  Abdomen: Soft, nontender, nondistended, + bowel sounds  Extremities: warm dry without cyanosis clubbing or edema  Neuro: AAOx3, nonfocal  Skin: Lower abdominal bruising, right, left, hypogastric area as well as  right groin  Psych: Normal affect and demeanor with intact judgement and insight   Data Reviewed: Sydney have personally reviewed following labs and imaging studies  CBC:  Recent Labs Lab 05/28/16 0747 05/29/16 0307 05/30/16 0253 05/31/16 0315 06/01/16 0237  WBC 6.5 7.3 6.6 8.8 8.3  HGB 6.8* 9.6* 10.4* 9.1* 8.8*  HCT 21.9* 29.7* 32.2* 28.7* 27.1*  MCV 88.7 85.6 85.2 86.7 86.6  PLT 129* 123* 148* 177 185   Basic Metabolic Panel:  Recent Labs Lab 05/28/16 0329 05/29/16 0307 05/30/16 0253 05/31/16 0315 06/01/16 0237  NA 142 141 139 141 139  K 3.5 3.4* 3.7 3.6 3.6  CL 107 108 102 102 100*  CO2 28 29 28 31 30   GLUCOSE 97 106* 153* 135* 131*  BUN 49* 39* 39* 48* 56*  CREATININE 1.75* 1.41* 1.60* 1.81* 2.13*  CALCIUM 7.6* 7.8* 8.2* 8.1* 8.3*   GFR: Estimated Creatinine Clearance: 15.6 mL/min (by C-G formula based on SCr of 2.13 mg/dL). Liver Function Tests:  Recent Labs Lab 05/28/16 0329  AST 40  ALT 32  ALKPHOS 38  BILITOT 0.4  PROT 4.2*  ALBUMIN 2.4*   No results for input(s): LIPASE, AMYLASE in the last 168 hours. No results  for input(s): AMMONIA in the last 168 hours. Coagulation Profile: No results for input(s): INR, PROTIME in the last 168 hours. Cardiac Enzymes: No results for input(s): CKTOTAL, CKMB, CKMBINDEX, TROPONINI in the last 168 hours. BNP (last 3 results) No results for input(s): PROBNP in the last 8760 hours. HbA1C: No results for input(s): HGBA1C in the last 72 hours. CBG: No results for input(s): GLUCAP in the last 168 hours. Lipid Profile: No results for input(s): CHOL, HDL, LDLCALC, TRIG, CHOLHDL, LDLDIRECT in the last 72 hours. Thyroid Function Tests: No results for input(s): TSH, T4TOTAL, FREET4, T3FREE, THYROIDAB in the last 72 hours. Anemia Panel: No results for input(s): VITAMINB12, FOLATE, FERRITIN, TIBC, IRON, RETICCTPCT in the last 72 hours. Urine analysis:    Component Value Date/Time   COLORURINE YELLOW 05/11/2016 2210    APPEARANCEUR CLEAR 05/11/2016 2210   LABSPEC 1.015 05/11/2016 2210   PHURINE 5.0 05/11/2016 2210   GLUCOSEU NEGATIVE 05/11/2016 2210   HGBUR NEGATIVE 05/11/2016 2210   BILIRUBINUR NEGATIVE 05/11/2016 2210   KETONESUR NEGATIVE 05/11/2016 2210   PROTEINUR 30 (A) 05/11/2016 2210   UROBILINOGEN 0.2 04/22/2010 1413   NITRITE NEGATIVE 05/11/2016 2210   LEUKOCYTESUR TRACE (A) 05/11/2016 2210   Sepsis Labs: @LABRCNTIP (procalcitonin:4,lacticidven:4)  ) Recent Results (from the past 240 hour(s))  MRSA PCR Screening     Status: None   Collection Time: 05/23/16  6:40 PM  Result Value Ref Range Status   MRSA by PCR NEGATIVE NEGATIVE Final    Comment:        The GeneXpert MRSA Assay (FDA approved for NASAL specimens only), is one component of a comprehensive MRSA colonization surveillance program. It is not intended to diagnose MRSA infection nor to guide or monitor treatment for MRSA infections.       Radiology Studies: No results found.   Scheduled Meds: . sodium chloride   Intravenous Once  . allopurinol  100 mg Oral QHS  . amLODipine  10 mg Oral Daily  . antiseptic oral rinse  7 mL Mouth Rinse q12n4p  . atorvastatin  80 mg Oral q1800  . chlorhexidine  15 mL Mouth Rinse BID  . furosemide  40 mg Oral Daily  . hydrALAZINE  50 mg Oral Q6H  . Living Better with Heart Jacobs Book   Does not apply Once  . loratadine  10 mg Oral Daily  . metoprolol tartrate  50 mg Oral BID  . pantoprazole  40 mg Oral Daily  . predniSONE  40 mg Oral Q breakfast   Continuous Infusions:    LOS: 9 days   Time Spent in minutes   30 minutes  Wirt Hemmerich D.O. on 06/01/2016 at 11:40 AM  Between 7am to 7pm - Pager - [phone]  After 7pm go to [email or web address] - password [email or web address]  And look for the night coverage person covering for me after hours  [facility] Office  [phone]

## 2016-06-01 NOTE — Progress Notes (Signed)
Patient resting O2 sat on RA: 93% 1L.  Resting O2 SAT on 1L 95% Walking O2 SAT on RA: 90% Walking o@ on 1L: 96%

## 2016-06-02 ENCOUNTER — Inpatient Hospital Stay (HOSPITAL_COMMUNITY): Payer: Commercial Managed Care - HMO

## 2016-06-02 LAB — CBC
HCT: 30 % — ABNORMAL LOW (ref 36.0–46.0)
Hemoglobin: 9.4 g/dL — ABNORMAL LOW (ref 12.0–15.0)
MCH: 27.6 pg (ref 26.0–34.0)
MCHC: 31.3 g/dL (ref 30.0–36.0)
MCV: 88 fL (ref 78.0–100.0)
PLATELETS: 211 10*3/uL (ref 150–400)
RBC: 3.41 MIL/uL — AB (ref 3.87–5.11)
RDW: 17.3 % — ABNORMAL HIGH (ref 11.5–15.5)
WBC: 8.6 10*3/uL (ref 4.0–10.5)

## 2016-06-02 LAB — BASIC METABOLIC PANEL
Anion gap: 9 (ref 5–15)
BUN: 61 mg/dL — AB (ref 6–20)
CALCIUM: 8.4 mg/dL — AB (ref 8.9–10.3)
CO2: 31 mmol/L (ref 22–32)
CREATININE: 2.22 mg/dL — AB (ref 0.44–1.00)
Chloride: 100 mmol/L — ABNORMAL LOW (ref 101–111)
GFR calc Af Amer: 23 mL/min — ABNORMAL LOW (ref >60)
GFR, EST NON AFRICAN AMERICAN: 20 mL/min — AB (ref >60)
Glucose, Bld: 106 mg/dL — ABNORMAL HIGH (ref 65–99)
Potassium: 3.6 mmol/L (ref 3.5–5.1)
SODIUM: 140 mmol/L (ref 135–145)

## 2016-06-02 LAB — SODIUM, URINE, RANDOM: SODIUM UR: 29 mmol/L

## 2016-06-02 LAB — CREATININE, URINE, RANDOM: CREATININE, URINE: 81.42 mg/dL

## 2016-06-02 LAB — OSMOLALITY, URINE: OSMOLALITY UR: 436 mosm/kg (ref 300–900)

## 2016-06-02 LAB — OSMOLALITY: Osmolality: 315 mOsm/kg — ABNORMAL HIGH (ref 275–295)

## 2016-06-02 MED ORDER — FUROSEMIDE 20 MG PO TABS
20.0000 mg | ORAL_TABLET | Freq: Every day | ORAL | Status: DC
  Administered 2016-06-03: 20 mg via ORAL
  Filled 2016-06-02: qty 1

## 2016-06-02 NOTE — Progress Notes (Signed)
Heart Failure Navigator Consult Note  Presentation: Sydney Jacobs is a 82 y.o. female with medical history significant of stroke, hypertension, and HLD presented with complaints of progressively worsening and constant shortness of breath that began yesterday. Patient reports that for the past week to 2 weeks she's had intermittent headaches, photophobia. She attributes this to a recent tick bite that she had 2 weeks ago. She was seen by her primary care physician who prescribed a course of doxycycline. She has completed this but has continued to have symptoms. Interestingly, family reports that approximately one month ago one of her antihypertensive medications was decreased/discontinued. Approximately a week later, she began having headaches, generalized weakness. Since yesterday, she describes shortness of breath and substernal chest pain. She describes a dull ache which is nonradiating. She feels that her extremities may be edematous but did not appear that way at this time.    Past Medical History:  Diagnosis Date  . Chronic diastolic CHF (congestive heart failure) (HCC)   . CKD (chronic kidney disease) stage 3, GFR 30-59 ml/min   . COPD (chronic obstructive pulmonary disease) (HCC)   . Essential hypertension   . History of stroke   . Hyperlipidemia   . Pulmonary nodule    Left upper lobe 1 cm pulmonary nodule - chest CT July 2017    Social History   Social History  . Marital status: Married    Spouse name: N/A  . Number of children: N/A  . Years of education: N/A   Social History Main Topics  . Smoking status: Former Smoker    Types: Cigarettes  . Smokeless tobacco: None  . Alcohol use No  . Drug use: No  . Sexual activity: Not Asked   Other Topics Concern  . None   Social History Narrative  . None    ECHO:Study Conclusions--05/09/16  - Left ventricle: The cavity size was normal. Wall thickness was   increased in a pattern of mild LVH. Systolic function was  normal.   The estimated ejection fraction was in the range of 55% to 60%.   Wall motion was normal; there were no regional wall motion   abnormalities. Doppler parameters are consistent with abnormal   left ventricular relaxation (grade 1 diastolic dysfunction).   Doppler parameters are consistent with high ventricular filling   pressure. - Aortic valve: Moderately calcified annulus. Trileaflet;   moderately thickened leaflets. Valve area (VTI): 2.25 cm^2. Valve   area (Vmax): 2.08 cm^2. - Mitral valve: Moderately calcified annulus. Moderately thickened   leaflets . The findings are consistent with mild stenosis. There   was mild regurgitation. - Left atrium: The atrium was mildly to moderately dilated. - Technically adequate study.  Transthoracic echocardiography.  M-mode, complete 2D, spectral Doppler, and color Doppler.  Birthdate:  Patient birthdate: 09/14/1934.  Age:  Patient is 82 yr old.  Sex:  Gender: female. Blood pressure:     167/79  Patient status:  Inpatient.  Study date:  Study date: 05/09/2016. Study time: 04:09 PM.  Location: Bedside.  Cardiac Catheterization- 05/26/16  Prox RCA lesion, 25% stenosed.  Ramus lesion, 25% stenosed.  Nonobstructive CAD.  Mildly elevated LVEDP.   Continue with management of diastolic heart failure.  BNP    Component Value Date/Time   BNP 1,216.0 (H) 05/23/2016 1330    ProBNP No results found for: PROBNP   Education Assessment and Provision:  Detailed education and instructions provided on heart failure disease management including the following:  Signs and symptoms of  Heart Failure When to call the physician Importance of daily weights Low sodium diet Fluid restriction Medication management Anticipated future follow-up appointments  Patient education given on each of the above topics.  Patient acknowledges understanding and acceptance of all instructions.   I spoke with [name] regarding her HF and current  hospitalization.  She tells me that this is "all new".  We discussed possible causes of HF and she tells me that she has battled "high blood pressure for years".  She says she has a scale.  I reviewed the importance of daily weights and when to contact the physician.  I also reviewed a low sodium diet and high sodium foods to avoid.  She denies any issues getting or taking prescribed medications.She will likely follow with [facility] either in [place] or [place].   Education Materials:  "Living Better With Heart Failure" Booklet, Daily Weight Tracker Tool .   High Risk Criteria for Readmission and/or Poor Patient Outcomes:    EF <30%- No 55-60% with grade 1 dias dys  2 or more admissions in 6 months- 2/6 mo--New HF  Difficult social situation- No  Demonstrates medication noncompliance- No denies   Barriers of Care: New HF- Knowledge and compliance  Discharge Planning:   Plans to return to home with husband in [place].  She has children locally for support as well.  She will need ongoing HF education, compliance reinforcement and symptom recognition.

## 2016-06-02 NOTE — Care Management Important Message (Signed)
Important Message  Patient Details  Name: Sydney Jacobs MRN: [id number] Date of Birth: August 26, 1934   Medicare Important Message Given:  Yes    Bernadette Hoit 06/02/2016, 11:45 AM

## 2016-06-02 NOTE — Progress Notes (Signed)
PROGRESS NOTE    Sydney Jacobs  FIE:[id number] DOB: February 25, 1934 DOA: 05/23/2016 PCP: Colette Ribas, MD   Brief Narrative:  HPI on 05/23/2016 by Dr. Erick Blinks Sydney Jacobs is a 82 y.o. female with medical history significant of diastolic chf, CKD 3, and COPD. Patient was recently discharged from the hospital on 7/7 after being treated for shortness of breath which was felt to be related to a combination of COPD and CHF. She has undergone diuresis during that hospitalization and had developed renal failure. On discharge, she was sent home on a prednisone taper, bronchodilators and beta blockers. It does not appear that diuretics were included in her discharge meds, likely due to recovering renal function. She says that since her discharge, she has been noticing worsening lower extremity edema. She has had significant orthopnea and has been unable to sleep flat. She has some central chest pain, which only occurs while coughing. This morning, she woke up to worsening shortness of breath, wheezing and came to the ED for evaluation.   Interim history Cardiac cath on 7/20 significant for mild CAD, significant anemia on 7/22, workup significant for right groin/right pelvic retroperitoneal hematoma, stable after transfusion,hemoglobin remained stable after 2 units PRBC transfusion, Improving dyspnea with diuresis, creatinine started to increase.  Assessment & Plan   Acute respiratory failure with hypoxia -Multifactorial, secondary to acute on chronic I saw [name], COPD exacerbation -Appears to be improving mildly with diuresis however patient did have a positive fluid balance 05/31/2016 -Continue fluid restriction and continue to monitor closely -Patient does not use home oxygen.  -CXR 7/26: improved compared to 7/22  Acute on chronic diastolic heart failure -Echocardiogram 05/09/2016 showed normal EF, grade 1 diastolic dysfunction -Currently not on ACE inhibitor due to renal  function -Improved with IV diuresis, however patient was transitioned to oral Lasix -Continue to monitor intake and output, daily weights -Urine output 1140cc over past 24 hours (net -300)  Anemia, secondary to blood loss -CT abdomen and pelvis significant for right groin/right pelvic retroperitoneal hematoma -Patient received 2 units PRBC on 05/28/2016 -Hemoccult negative -Aspirin chemical DVT prophylaxis held -Hemoglobin currently 9.4, continue to monitor CBC  Right groin hematoma/retroperitoneal bleed -Treatment plan as above, resume aspirin 7 days of hemoglobin remained stable as an outpatient, per cardiology  AAA -4.47 m infrarenal abdominal aortic aneurysm found on imaging -Previous hospitalist discussed with Dr. Myra Gianotti, recommended outpatient follow-up  Chest pain -Patient did have cardiac catheterization on 05/26/2016, only mild CAD noted. Currently chest pain-free  Acute COPD exacerbation -Continue steroids and breathing treatments  Acute on chronic kidney disease, stage III - IV -ACE inhibitor/RB currently held -Creatinine 2.22 today -Spoke with nephrology in person, Dr. Darrick Penna, who recommended continuing lasix -Continue to monitor BMP -Ordered renal US and urine electrolytes  Possible HCAP -Patient completed 3 days of Zosyn, currently afebrile with normal white blood cell count -Repeat chest x-ray  Left upper lobe pulmonary nodule -Found on imaging, patient will need outpatient PET scan for further evaluation. Follow-up with Dr. Juanetta Gosling  Essential hypertension -Controlled, continue metoprolol and hydralazine  DVT Prophylaxis  SCDs  Code Status: Full  Family Communication: Husband at bedside  Disposition Plan: Admitted. Possibly discharge within 24-48 hours  Consultants Cardiology Pulmonology (Dr. Juanetta Gosling at [facility]) Dr. Myra Gianotti  Procedures  Cardiac catheterization   Antibiotics   Anti-infectives    Start     Dose/Rate Route Frequency  Ordered Stop   05/25/16 0600  vancomycin (VANCOCIN) 500 mg in sodium chloride  0.9 % 100 mL IVPB  Status:  Discontinued     500 mg 100 mL/hr over 60 Minutes Intravenous Every 24 hours 05/24/16 0848 05/25/16 1637   05/24/16 0900  piperacillin-tazobactam (ZOSYN) IVPB 3.375 g     3.375 g 12.5 mL/hr over 240 Minutes Intravenous Every 8 hours 05/24/16 0845 05/26/16 2104   05/24/16 0900  vancomycin (VANCOCIN) IVPB 1000 mg/200 mL premix     1,000 mg 200 mL/hr over 60 Minutes Intravenous  Once 05/24/16 0846 05/24/16 1027      Subjective:   [name] seen and examined today.  Patient continues to feel short of breath and feels she may need to go home with oxygen.  Denies chest pain, abdominal pain, nausea or vomiting, headache or dizziness.  Objective:   Vitals:   06/01/16 0539 06/01/16 1158 06/01/16 2003 06/02/16 0527  BP: (!) 154/68 (!) 153/65 (!) 159/66 (!) 167/72  Pulse: 75 71 81 78  Resp: 18 16 18 18   Temp: 98 F (36.7 C) 97.3 F (36.3 C) 98 F (36.7 C) 97.8 F (36.6 C)  TempSrc: Oral Oral Oral Oral  SpO2: 95% 94% 94% 93%  Weight: 53.7 kg (118 lb 4.8 oz)   53.8 kg (118 lb 8 oz)  Height:        Intake/Output Summary (Last 24 hours) at 06/02/16 1226 Last data filed at 06/02/16 1610  Gross per 24 hour  Intake              600 ml  Output             1000 ml  Net             -400 ml   Filed Weights   05/31/16 0453 06/01/16 0539 06/02/16 0527  Weight: 53.4 kg (117 lb 11.2 oz) 53.7 kg (118 lb 4.8 oz) 53.8 kg (118 lb 8 oz)    Exam  General: Well developed, well nourished, NAD, appears stated age  HEENT: NCAT, mucous membranes moist.   Cardiovascular: S1 S2 auscultated, no murmurs, RRR  Respiratory: Dimished but clear,  with equal chest rise  Abdomen: Soft, nontender, nondistended, + bowel sounds  Extremities: warm dry without cyanosis clubbing or edema  Neuro: AAOx3, nonfocal  Skin: Lower abdominal bruising, right, left, hypogastric area as well as right  groin  Psych: Normal affect and demeanor with intact judgement and insight, pleasant   Data Reviewed: I have personally reviewed following labs and imaging studies  CBC:  Recent Labs Lab 05/29/16 0307 05/30/16 0253 05/31/16 0315 06/01/16 0237 06/02/16 0341  WBC 7.3 6.6 8.8 8.3 8.6  HGB 9.6* 10.4* 9.1* 8.8* 9.4*  HCT 29.7* 32.2* 28.7* 27.1* 30.0*  MCV 85.6 85.2 86.7 86.6 88.0  PLT 123* 148* 177 185 211   Basic Metabolic Panel:  Recent Labs Lab 05/29/16 0307 05/30/16 0253 05/31/16 0315 06/01/16 0237 06/02/16 0341  NA 141 139 141 139 140  K 3.4* 3.7 3.6 3.6 3.6  CL 108 102 102 100* 100*  CO2 29 28 31 30 31   GLUCOSE 106* 153* 135* 131* 106*  BUN 39* 39* 48* 56* 61*  CREATININE 1.41* 1.60* 1.81* 2.13* 2.22*  CALCIUM 7.8* 8.2* 8.1* 8.3* 8.4*   GFR: Estimated Creatinine Clearance: 15 mL/min (by C-G formula based on SCr of 2.22 mg/dL). Liver Function Tests:  Recent Labs Lab 05/28/16 0329  AST 40  ALT 32  ALKPHOS 38  BILITOT 0.4  PROT 4.2*  ALBUMIN 2.4*   No results for input(s): LIPASE,  AMYLASE in the last 168 hours. No results for input(s): AMMONIA in the last 168 hours. Coagulation Profile: No results for input(s): INR, PROTIME in the last 168 hours. Cardiac Enzymes: No results for input(s): CKTOTAL, CKMB, CKMBINDEX, TROPONINI in the last 168 hours. BNP (last 3 results) No results for input(s): PROBNP in the last 8760 hours. HbA1C: No results for input(s): HGBA1C in the last 72 hours. CBG: No results for input(s): GLUCAP in the last 168 hours. Lipid Profile: No results for input(s): CHOL, HDL, LDLCALC, TRIG, CHOLHDL, LDLDIRECT in the last 72 hours. Thyroid Function Tests: No results for input(s): TSH, T4TOTAL, FREET4, T3FREE, THYROIDAB in the last 72 hours. Anemia Panel: No results for input(s): VITAMINB12, FOLATE, FERRITIN, TIBC, IRON, RETICCTPCT in the last 72 hours. Urine analysis:    Component Value Date/Time   COLORURINE YELLOW 05/11/2016 2210    APPEARANCEUR CLEAR 05/11/2016 2210   LABSPEC 1.015 05/11/2016 2210   PHURINE 5.0 05/11/2016 2210   GLUCOSEU NEGATIVE 05/11/2016 2210   HGBUR NEGATIVE 05/11/2016 2210   BILIRUBINUR NEGATIVE 05/11/2016 2210   KETONESUR NEGATIVE 05/11/2016 2210   PROTEINUR 30 (A) 05/11/2016 2210   UROBILINOGEN 0.2 04/22/2010 1413   NITRITE NEGATIVE 05/11/2016 2210   LEUKOCYTESUR TRACE (A) 05/11/2016 2210   Sepsis Labs: (procalcitonin:4,lacticidven:4)  ) Recent Results (from the past 240 hour(s))  MRSA PCR Screening     Status: None   Collection Time: 05/23/16  6:40 PM  Result Value Ref Range Status   MRSA by PCR NEGATIVE NEGATIVE Final    Comment:        The GeneXpert MRSA Assay (FDA approved for NASAL specimens only), is one component of a comprehensive MRSA colonization surveillance program. It is not intended to diagnose MRSA infection nor to guide or monitor treatment for MRSA infections.       Radiology Studies: Dg Chest 2 View  Result Date: 06/01/2016 CLINICAL DATA:  Short of breath for 1 week. History of COPD and CHF. EXAM: CHEST  2 VIEW COMPARISON:  05/28/2016 FINDINGS: Cardiac silhouette is top-normal in size. The aorta is mildly uncoiled. No mediastinal or hilar masses or evidence of adenopathy. Minimal left pleural effusion with associated posterior lower lobe opacity, likely atelectasis. This has improved. Prominent bronchovascular markings and mild interstitial thickening is noted similar to the recent prior studies. No new areas of lung opacity. No pneumothorax. IMPRESSION: 1. Improvement in left lung base opacity. Minimal residual left pleural effusion with associated atelectasis. 2. Mild interstitial thickening and bronchovascular prominence suggesting mild residual congestive heart failure. Electronically Signed   By: Amie Portland M.D.   On: 06/01/2016 12:59    Scheduled Meds: . sodium chloride   Intravenous Once  . allopurinol  100 mg Oral QHS  . amLODipine   10 mg Oral Daily  . antiseptic oral rinse  7 mL Mouth Rinse q12n4p  . atorvastatin  80 mg Oral q1800  . chlorhexidine  15 mL Mouth Rinse BID  . furosemide  40 mg Oral Daily  . hydrALAZINE  50 mg Oral Q6H  . Living Better with Heart Failure Book   Does not apply Once  . loratadine  10 mg Oral Daily  . metoprolol tartrate  50 mg Oral BID  . pantoprazole  40 mg Oral Daily  . predniSONE  40 mg Oral Q breakfast   Continuous Infusions:    LOS: 10 days   Time Spent in minutes   30 minutes  Mauro Arps D.O. on 06/02/2016 at 12:26 PM  Between  7am to 7pm - Pager - [phone]  After 7pm go to [email or web address] - password [email or web address]  And look for the night coverage person covering for me after hours  [facility] Office  [phone]

## 2016-06-03 LAB — BASIC METABOLIC PANEL
ANION GAP: 9 (ref 5–15)
BUN: 67 mg/dL — ABNORMAL HIGH (ref 6–20)
CHLORIDE: 103 mmol/L (ref 101–111)
CO2: 31 mmol/L (ref 22–32)
Calcium: 8.6 mg/dL — ABNORMAL LOW (ref 8.9–10.3)
Creatinine, Ser: 2.22 mg/dL — ABNORMAL HIGH (ref 0.44–1.00)
GFR calc Af Amer: 23 mL/min — ABNORMAL LOW (ref >60)
GFR, EST NON AFRICAN AMERICAN: 20 mL/min — AB (ref >60)
Glucose, Bld: 93 mg/dL (ref 65–99)
POTASSIUM: 3.5 mmol/L (ref 3.5–5.1)
SODIUM: 143 mmol/L (ref 135–145)

## 2016-06-03 LAB — CBC
HEMATOCRIT: 29.2 % — AB (ref 36.0–46.0)
HEMOGLOBIN: 9.3 g/dL — AB (ref 12.0–15.0)
MCH: 27.5 pg (ref 26.0–34.0)
MCHC: 31.8 g/dL (ref 30.0–36.0)
MCV: 86.4 fL (ref 78.0–100.0)
Platelets: 223 10*3/uL (ref 150–400)
RBC: 3.38 MIL/uL — AB (ref 3.87–5.11)
RDW: 17.3 % — ABNORMAL HIGH (ref 11.5–15.5)
WBC: 9.2 10*3/uL (ref 4.0–10.5)

## 2016-06-03 MED ORDER — FUROSEMIDE 20 MG PO TABS
20.0000 mg | ORAL_TABLET | Freq: Every day | ORAL | 0 refills | Status: DC
Start: 2016-06-03 — End: 2016-07-06

## 2016-06-03 MED ORDER — PREDNISONE 20 MG PO TABS
40.0000 mg | ORAL_TABLET | Freq: Every day | ORAL | Status: DC
  Administered 2016-06-03: 40 mg via ORAL
  Filled 2016-06-03: qty 2

## 2016-06-03 MED ORDER — METOPROLOL TARTRATE 50 MG PO TABS: 50.0000 mg | ORAL_TABLET | Freq: Two times a day (BID) | ORAL | 0 refills | Status: DC

## 2016-06-03 MED ORDER — ATORVASTATIN CALCIUM 20 MG PO TABS: 20.0000 mg | ORAL_TABLET | Freq: Every day | ORAL | 0 refills | Status: DC

## 2016-06-03 MED ORDER — PANTOPRAZOLE SODIUM 40 MG PO TBEC: 40.0000 mg | DELAYED_RELEASE_TABLET | Freq: Every day | ORAL | 0 refills | Status: DC

## 2016-06-03 MED ORDER — HYDRALAZINE HCL 50 MG PO TABS: 50.0000 mg | ORAL_TABLET | Freq: Four times a day (QID) | ORAL | 0 refills | Status: DC

## 2016-06-03 MED ORDER — PREDNISONE 10 MG PO TABS: ORAL_TABLET | ORAL | 0 refills | Status: DC

## 2016-06-03 NOTE — Progress Notes (Signed)
SATURATION QUALIFICATIONS: (This note is used to comply with regulatory documentation for home oxygen)  Patient Saturations on Room Air at Rest = 90%  Patient Saturations on Room Air while Ambulating = 87%  Patient Saturations on 2 Liters of oxygen while Ambulating = 93%  Please briefly explain why patient needs home oxygen: Pt O2 sats dropped to 87% on RA while walking.

## 2016-06-03 NOTE — Progress Notes (Signed)
Pt has orders to be discharged. Discharge instructions given and pt has no additional questions at this time. Medication regimen reviewed and pt educated. Pt verbalized understanding and has no additional questions. Telemetry box removed. IV removed and site in good condition. Pt stable and waiting for transportation.  Jadrien Narine RN 

## 2016-06-03 NOTE — Progress Notes (Signed)
Oxygen for home to be delivered today to the patient's room today prior to discharging home today. [facility] made aware of discharge home also; Alexis Goodell [phone]

## 2016-06-03 NOTE — Discharge Instructions (Signed)
Respiratory failure is when your lungs are not working well and your breathing (respiratory) system fails. When respiratory failure occurs, it is difficult for your lungs to get enough oxygen, get rid of carbon dioxide, or both. Respiratory failure can be life threatening.  °Respiratory failure can be acute or chronic. Acute respiratory failure is sudden, severe, and requires emergency medical treatment. Chronic respiratory failure is less severe, happens over time, and requires ongoing treatment.  °WHAT ARE THE CAUSES OF ACUTE RESPIRATORY FAILURE?  °Any problem affecting the heart or lungs can cause acute respiratory failure. Some of these causes include the following: °· Chronic bronchitis and emphysema (COPD).   °· Blood clot going to a lung (pulmonary embolism).   °· Having water in the lungs caused by heart failure, lung injury, or infection (pulmonary edema).   °· Collapsed lung (pneumothorax).   °· Pneumonia.   °· Pulmonary fibrosis.   °· Obesity.   °· Asthma.   °· Heart failure.   °· Any type of trauma to the chest that can make breathing difficult.   °· Nerve or muscle diseases making chest movements difficult. °HOW WILL MY ACUTE RESPIRATORY FAILURE BE TREATED?  °Treatment of acute respiratory failure depends on the cause of the respiratory failure. Usually, you will stay in the intensive care unit so your breathing can be watched closely. Treatment can include the following: °· Oxygen. Oxygen can be delivered through the following: °¨ Nasal cannula. This is small tubing that goes in your nose to give you oxygen. °¨ Face mask. A face mask covers your nose and mouth to give you oxygen. °· Medicine. Different medicines can be given to help with breathing. These can include: °¨ Nebulizers. Nebulizers deliver medicines to open the air passages (bronchodilators). These medicines help to open or relax the airways in the lungs so you can breathe better. They can also help loosen mucus from your  lungs. °¨ Diuretics. Diuretic medicines can help you breathe better by getting rid of extra water in your body. °¨ Steroids. Steroid medicines can help decrease swelling (inflammation) in your lungs. °¨ Antibiotics. °· Chest tube. If you have a collapsed lung (pneumothorax), a chest tube is placed to help reinflate the lung. °· Noninvasive positive pressure ventilation (NPPV). This is a tight-fitting mask that goes over your nose and mouth. The mask has tubing that is attached to a machine. The machine blows air into the tubing, which helps to keep the tiny air sacs (alveoli) in your lungs open. This machine allows you to breathe on your own. °· Ventilator. A ventilator is a breathing machine. When on a ventilator, a breathing tube is put into the lungs. A ventilator is used when you can no longer breathe well enough on your own. You may have low oxygen levels or high carbon dioxide (CO2) levels in your blood. When you are on a ventilator, sedation and pain medicines are given to make you sleep so your lungs can heal. °SEEK IMMEDIATE MEDICAL CARE IF: °· You have shortness of breath (dyspnea) with or without activity. °· You have rapid breathing (tachypnea). °· You are wheezing. °· You are unable to say more than a few words without having to catch your breath. °· You find it very difficult to function normally. °· You have a fast heart rate. °· You have a bluish color to your finger or toe nail beds. °· You have confusion or drowsiness or both. °  °This information is not intended to replace advice given to you by your health care provider. Make sure you discuss   any questions you have with your health care provider. °  °Document Released: 10/29/2013 Document Revised: 07/15/2015 Document Reviewed: 10/29/2013 °[employer] Interactive Patient Education ©2016 [employer] ° °

## 2016-06-03 NOTE — Discharge Summary (Signed)
Physician Discharge Summary  Sydney Jacobs:[id number] DOB: 08-16-1934 DOA: 05/23/2016  PCP: Sydney Ribas, MD  Admit date: 05/23/2016 Discharge date: 06/03/2016  Time spent: 45 minutes  Recommendations for Outpatient Follow-up:  Patient will be discharged to home with home oxygen.  Patient will need to follow up with primary care provider within one week of discharge, repeat CBC and BMP (labs). Follow up with Dr. Juanetta Jacobs, pulmonology.  Follow up with Dr. Darl Jacobs, cardiology.  Patient should continue medications as prescribed.  Patient should follow a heart healthy diet with fluid restriction per day.  Discharge Diagnoses:  Acute respiratory failure with hypoxia Acute on chronic diastolic heart failure Anemia, secondary to blood loss Right groin hematoma/retroperitoneal bleed AAA Chest pain Acute COPD exacerbation Acute on chronic kidney disease, stage III - IV Possible HCAP Left upper lobe pulmonary nodule Essential hypertension  Discharge Condition: Stable  Diet recommendation: heart healthy diet with fluid restriction per day  [employer] [employer] Weights   06/01/16 0539 06/02/16 0527 06/03/16 0505  Weight: 53.7 kg (118 lb 4.8 oz) 53.8 kg (118 lb 8 oz) 53.4 kg (117 lb 11.2 oz)    History of present illness:  on 05/23/2016 by Dr. [name] Jacobs a 82 y.o.femalewith medical history significant of diastolic chf, CKD 3, and COPD. Patient was recently discharged from the hospital on 7/7 after being treated for shortness of breath which was felt to be related to a combination of COPD and CHF. She has undergone diuresis during that hospitalization and had developed renal failure. On discharge, she was sent home on a prednisone taper, bronchodilators and beta blockers. It does not appear that diuretics were included in her discharge meds, likely due to recovering renal function. She says that since her discharge, she has been noticing worsening lower  extremity edema. She has had significant orthopnea and has been unable to sleep flat. She has some central chest pain, which only occurs while coughing. This morning, she woke up to worsening shortness of breath, wheezing and came to the ED for evaluation.   Hospital Course:  Acute respiratory failure with hypoxia -Multifactorial, secondary to acute on chronic I saw Carter failure, COPD exacerbation -Appears to be improving mildly with diuresis however patient did have a positive fluid balance 05/31/2016 -Continue fluid restriction and continue to monitor closely -CXR 7/26: improved compared to 7/22 -Oxygen saturations monitored, patient's O2 fell to 87% on room air with ambulation -Will discharge patient with home oxygen  Acute on chronic diastolic heart failure -Echocardiogram 05/09/2016 showed normal EF, grade 1 diastolic dysfunction -Currently not on ACE inhibitor due to renal function -Improved with IV diuresis, however patient was transitioned to oral Lasix -Continue to monitor intake and output, daily weights -Urine output 1200cc over past 24 hours (net -860)  Anemia, secondary to blood loss -CT abdomen and pelvis significant for right groin/right pelvic retroperitoneal hematoma -Patient received 2 units PRBC on 05/28/2016 -Hemoccult negative -Aspirin chemical DVT prophylaxis held -Hemoglobin currently 9.3 -Repeat CBC in one week  Right groin hematoma/retroperitoneal bleed -Treatment plan as above, resume aspirin 7 days of hemoglobin remained stable as an outpatient, per cardiology  AAA -4.47 m infrarenal abdominal aortic aneurysm found on imaging -Previous hospitalist discussed with Dr. Myra Jacobs, recommended outpatient follow-up  Chest pain -Patient did have cardiac catheterization on 05/26/2016, only mild CAD noted. Currently chest pain-free -Aspirin held due to hematoma  Acute COPD exacerbation -Continue steroids and breathing treatments  Acute on chronic  kidney disease, stage III -  IV -ACE inhibitor/RB currently held -Creatinine 2.22 today -Spoke with nephrology in person, Dr. Darrick Jacobs, who recommended continuing lasix -Continue to monitor BMP -Renal US: B/L renal cysts, Echogenic renal parenchyma L>R -Repeat BMP in one week  Possible HCAP -Patient completed 3 days of Zosyn, currently afebrile with normal white blood cell count -Repeat chest x-ray as above  Left upper lobe pulmonary nodule -Found on imaging, patient will need outpatient PET scan for further evaluation. Follow-up with Dr. Juanetta Jacobs  Essential hypertension -Controlled, continue metoprolol and hydralazine  Consultants Cardiology Pulmonology (Dr. Juanetta Jacobs at [facility]) Dr. Myra Jacobs  Procedures  Cardiac catheterization   Discharge Exam: Vitals:   06/02/16 2140 06/03/16 0505  BP: (!) 166/62 (!) 174/72  Pulse: 77 71  Resp: 18 (!) 22  Temp: 98 F (36.7 C) 98 F (36.7 C)    Exam  General: Well developed, well nourished, NAD, appears stated age  HEENT: NCAT, mucous membranes moist.   Cardiovascular: S1 S2 auscultated, no murmurs, RRR  Respiratory: Dimished but clear, with equal chest rise  Abdomen: Soft, nontender, nondistended, + bowel sounds  Extremities: warm dry without cyanosis clubbing or edema  Neuro: AAOx3, nonfocal  Skin: Lower abdominal bruising, right  Groin (improving)  Psych: Normal affect and demeanor with intact judgement and insight, pleasant  Discharge Instructions  Discharge Instructions    Discharge instructions    Complete by:  As directed   Patient will be discharged to home with home oxygen.  Patient will need to follow up with primary care provider within one week of discharge, repeat CBC and BMP (labs). Follow up with Dr. Juanetta Jacobs, pulmonology.  Follow up with Dr. Darl Jacobs, cardiology.  Patient should continue medications as prescribed.  Patient should follow a heart healthy diet with fluid restriction per day.         Medication List    STOP taking these medications   aspirin EC 81 MG tablet     TAKE these medications   albuterol 108 (90 Base) MCG/ACT inhaler Commonly known as:  PROVENTIL HFA;VENTOLIN HFA Inhale 2 puffs into the lungs every 6 (six) hours as needed for wheezing or shortness of breath. Pharmacy please instruct in proper techinique   allopurinol 100 MG tablet Commonly known as:  ZYLOPRIM Take 1 tablet by mouth at bedtime.   amLODipine 10 MG tablet Commonly known as:  NORVASC Take 1 tablet (10 mg total) by mouth daily.   atorvastatin 20 MG tablet Commonly known as:  LIPITOR Take 1 tablet (20 mg total) by mouth daily at 6 PM.   cetirizine 10 MG tablet Commonly known as:  ZYRTEC Take 10 mg by mouth daily.   furosemide 20 MG tablet Commonly known as:  LASIX Take 1 tablet (20 mg total) by mouth daily.   hydrALAZINE 50 MG tablet Commonly known as:  APRESOLINE Take 1 tablet (50 mg total) by mouth every 6 (six) hours.   metoprolol 50 MG tablet Commonly known as:  LOPRESSOR Take 1 tablet (50 mg total) by mouth 2 (two) times daily. What changed:  medication strength  how much to take   pantoprazole 40 MG tablet Commonly known as:  PROTONIX Take 1 tablet (40 mg total) by mouth daily.   predniSONE 10 MG tablet Commonly known as:  DELTASONE Take 40mg  (4 tabs) x 2 days, then taper to 30mg  (3 tabs) x 2 days, then 20mg  (2 tabs) x 2 days, then 10mg  (1 tab) x 2 days.   Vitamin D (Ergocalciferol) 50000 units Caps capsule  Commonly known as:  DRISDOL Take 1 capsule by mouth once a week. Takes on Fridays.      Allergies  Allergen Reactions  . Sulfa Antibiotics Other (See Comments)    unknown   Follow-up Information    Durene Cal, MD .   Specialties:  Vascular Surgery, Cardiology Why:  office will call with appointment Contact information: [address] [place] [address] [phone]        [facility] .   Why:  They will do your home health  care at your home Contact information: [address] [place] [address] [phone]        [facility] .   Specialty:  Home Health Services Why:  [facility] will do your home health care at your home Contact information: [address] [place] [address] [phone]        Sydney Ribas, MD Follow up in 1 week(s).   Specialty:  Family Medicine Why:  hospital follow up Contact information: [address] [place] [address] [phone]        Prentice Docker, MD Follow up in 2 week(s).   Specialty:  Cardiology Why:  Hospital follow up Contact information: [address] [place] [address] [phone]        HAWKINS,EDWARD L, MD. Schedule an appointment as soon as possible for a visit in 2 week(s).   Specialty:  Pulmonary Disease Why:  Hospital follow up Contact information: [address] [place] [address] [phone]            The results of significant diagnostics from this hospitalization (including imaging, microbiology, ancillary and laboratory) are listed below for reference.    Significant Diagnostic Studies: Ct Abdomen Pelvis Wo Contrast  Result Date: 05/29/2016 CLINICAL DATA:  Recent catheterization in the right groin. Anemia and abdominal wall bruising. Evaluation for hematoma. EXAM: CT ABDOMEN AND PELVIS WITHOUT CONTRAST TECHNIQUE: Multidetector CT imaging of the abdomen and pelvis was performed following the standard protocol without IV contrast. COMPARISON:  Renal ultrasound 01/06/2014 FINDINGS: Small bilateral pleural effusions are partially visualized with overlying bilateral lower lobe atelectasis. Three-vessel coronary artery calcification. Mitral annular calcification. The liver, gallbladder, and spleen are unremarkable. There is minimal nodularity of the left adrenal gland. There is marked left renal atrophy with a 4.0 cm low-density lesion extending anteriorly from  the interpolar kidney likely representing a cyst. Subcentimeter hyperdense lesions in the upper pole of the left kidney likely represent hemorrhagic/proteinaceous cysts. A large right upper pole renal cyst measures 10.8 cm. The right kidney is inferiorly located/ectopic and abnormally rotated. A 1.4 cm right renal sinus cyst is noted inferiorly. There is no hydronephrosis. The right adrenal gland is not well seen due to regional distortion by the large renal cyst. A couple of punctate calcifications are noted in the pancreas. There is no evidence of bowel obstruction. There is left-sided colonic diverticulosis without evidence of diverticulitis. Assessment of the pelvis is partially limited by streak artifact from the patient's right total hip arthroplasty. The bladder is grossly unremarkable. The uterus is absent. Stranding is seen in the soft tissues of the right inguinal region consistent with recent catheterization. There is a 3.7 x 3.7 cm hematoma in the right groin. There is also small to moderate volume right-sided retroperitoneal hemorrhage in the pelvis measuring up to 7.1 x 3.7 cm on axial images. There is extensive atherosclerotic calcification of the aorta and its major branch vessels. There is aneurysmal dilatation  of the infrarenal abdominal aorta measuring 4.4 x 4.1 cm with displacement of intimal calcifications centrally into the lumen. No enlarged lymph nodes are identified. All multilevel disc and facet degeneration in the lumbar spine. IMPRESSION: 1. Small right groin hematoma and small to moderate sized right-sided pelvic retroperitoneal hematoma. 2. Small bilateral pleural effusions and bibasilar atelectasis. 3. Bilateral renal lesions as above including a large right upper pole cyst. The marked left renal atrophy. 4. 4.4 cm infrarenal abdominal aortic aneurysm. Calcifications central in the lumen may reflect calcified plaque/thrombus although a dissection flap could also give this appearance.  These results will be called to the ordering clinician or representative by the Radiologist Assistant, and communication documented in the PACS or zVision Dashboard. Electronically Signed   By: Sebastian Ache M.D.   On: 05/29/2016 00:46  Dg Chest 1 View  Result Date: 05/08/2016 CLINICAL DATA:  Shortness of breath for 2 weeks. EXAM: CHEST 1 VIEW COMPARISON:  09/16/2010 FINDINGS: The heart size and mediastinal contours are within normal limits. Aortic atherosclerosis noted. Both lungs are clear. No evidence of pulmonary infiltrate or pleural effusion. IMPRESSION: No active disease.  Aortic atherosclerosis noted. Electronically Signed   By: Myles Rosenthal M.D.   On: 05/08/2016 10:14   Dg Chest 2 View  Result Date: 06/01/2016 CLINICAL DATA:  Short of breath for 1 week. History of COPD and CHF. EXAM: CHEST  2 VIEW COMPARISON:  05/28/2016 FINDINGS: Cardiac silhouette is top-normal in size. The aorta is mildly uncoiled. No mediastinal or hilar masses or evidence of adenopathy. Minimal left pleural effusion with associated posterior lower lobe opacity, likely atelectasis. This has improved. Prominent bronchovascular markings and mild interstitial thickening is noted similar to the recent prior studies. No new areas of lung opacity. No pneumothorax. IMPRESSION: 1. Improvement in left lung base opacity. Minimal residual left pleural effusion with associated atelectasis. 2. Mild interstitial thickening and bronchovascular prominence suggesting mild residual congestive heart failure. Electronically Signed   By: Amie Portland M.D.   On: 06/01/2016 12:59  Ct Head Wo Contrast  Result Date: 05/08/2016 CLINICAL DATA:  Headache, nausea, and photophobia for 1 week. EXAM: CT HEAD WITHOUT CONTRAST TECHNIQUE: Contiguous axial images were obtained from the base of the skull through the vertex without intravenous contrast. COMPARISON:  04/28/2016 FINDINGS: There is no evidence of intracranial hemorrhage, brain edema, or other signs of  acute infarction. There is no evidence of intracranial mass lesion or mass effect. No abnormal extraaxial fluid collections are identified. Mild diffuse cerebral atrophy and chronic small vessel disease are stable in appearance. No evidence hydrocephalus. No skull abnormality identified. IMPRESSION: No acute intracranial abnormality. Stable cerebral atrophy and chronic small vessel disease. Electronically Signed   By: Myles Rosenthal M.D.   On: 05/08/2016 10:25   Ct Angio Chest Pe W Or Wo Contrast  Result Date: 05/08/2016 CLINICAL DATA:  Shortness of breath for 1 week. EXAM: CT ANGIOGRAPHY CHEST WITH CONTRAST TECHNIQUE: Multidetector CT imaging of the chest was performed using the standard protocol during bolus administration of intravenous contrast. Multiplanar CT image reconstructions and MIPs were obtained to evaluate the vascular anatomy. CONTRAST:  75 cc Isovue 370 IV COMPARISON:  Radiograph earlier this day. FINDINGS: Cardiovascular: Motion limits detailed assessment of the pulmonary arteries, particularly of the lower lobes, no evidence of filling defects or pulmonary embolus. Calcified and noncalcified atheromatous plaque throughout the thoracic aorta. No thoracic aortic aneurysm. Some of this plaque appears irregular. There are coronary artery calcifications. Minimal reflux of contrast into  the IVC. Mediastinum/Nodes: There is an enlarged right hilar node measuring 1.8 cm short axis. Soft tissue density in the left infrahilar region may be a low left hilar node measuring 1.2 cm. Subcarinal node measures 11 mm. Lower anterior paratracheal node also measures 11 mm. Additional lower paratracheal nodes measuring 10 mm short axis. No pericardial effusion. Lungs/Pleura: In the left upper lobe is a 11 x 10 x 10 mm nodule image 36 series 7. Probable spiculated margins, however are motion artifact through this region. Punctate subpleural nodule in the right lower lobe image 58 series 7. Calcified granuloma in the  lung apices. Mild background emphysema. Bronchial thickening particularly in the lower lobes, detailed dependent lower lobe evaluation limited by motion. Small pleural effusion with adjacent densities likely atelectasis. Trachea and proximal bronchi are patent. Probable atelectasis in the medial right middle lobe. There are small bilateral pleural effusions with adjacent compressive atelectasis. Upper Abdomen: Cyst in the right upper quadrant likely renal in origin, and measures at least 8.3 cm, cyst described in this region on ultrasound measured 13 cm. Lobular borders of the spleen. Left adrenal gland tentatively identified without discrete nodule. Right adrenal gland not definitively seen. Tortuosity and atherosclerosis of the upper abdominal aorta. Musculoskeletal: No blastic or destructive lytic lesions. Exaggerated thoracic kyphosis. Review of the MIP images confirms the above findings. IMPRESSION: 1. No pulmonary embolus. 2. Left upper lobe 1 cm pulmonary nodule, partially obscured by motion, however likely spiculated. Nodule is suspicious for primary bronchogenic malignancy. This is at the size limit for PET-CT characterization. There is an enlarged contralateral right hilar node and prominent mediastinal nodes. 3. Mild emphysema. Bronchial thickening is most prominent in the lower lobes, may be acute or chronic. 4. Small pleural effusions with likely adjacent atelectasis. 5. Thoracic aortic atherosclerosis including coronary artery calcifications. These results will be called to the ordering clinician or representative by the Radiologist Assistant, and communication documented in the PACS or zVision Dashboard. Electronically Signed   By: Rubye Oaks M.D.   On: 05/08/2016 19:03   US Renal  Result Date: 06/02/2016 CLINICAL DATA:  Acute kidney injury. History of hypertension, chronic kidney disease stage 3. EXAM: RENAL / URINARY TRACT ULTRASOUND COMPLETE COMPARISON:  CT 05/28/2016 FINDINGS: Right  Kidney: Length: 16.1 cm. Rotated and positioned in the right mid abdomen. No hydronephrosis. Echogenicity appears normal. A simple cyst is 13.1 x 7.6 x 11.4 cm. Left Kidney: Length: 8.4 cm. Cysts are identified measuring 5.2 x 3.1 x 3.6 cm and 1.1 x 1.4 x 1.2 cm. Renal parenchyma appears echogenic. Bladder: Appears normal for degree of bladder distention. IMPRESSION: 1. Bilateral renal cysts. 2. Echogenic renal parenchyma of left greater than right. Electronically Signed   By: Norva Pavlov M.D.   On: 06/02/2016 12:45  Dg Chest Port 1 View  Result Date: 05/28/2016 CLINICAL DATA:  Shortness of breath and chest pain. EXAM: PORTABLE CHEST 1 VIEW COMPARISON:  05/24/2016 FINDINGS: There is bibasilar atelectasis and a small left pleural effusion. No overt pulmonary edema. The heart size and mediastinal contours are normal. IMPRESSION: Bibasilar atelectasis with small left pleural effusion. Electronically Signed   By: Irish Lack M.D.   On: 05/28/2016 09:57   Dg Chest Port 1 View  Result Date: 05/24/2016 CLINICAL DATA:  Patient with respiratory failure. EXAM: PORTABLE CHEST 1 VIEW COMPARISON:  Chest radiograph 05/23/2016. FINDINGS: Multiple monitoring leads overlie the patient. Stable cardiomegaly with tortuosity and calcification of the thoracic aorta. Persistent bibasilar airspace opacities. Small bilateral pleural effusions. No  pneumothorax. Known 1 cm left upper lobe nodule not well demonstrated on current evaluation. IMPRESSION: Unchanged bibasilar airspace opacities and small bilateral pleural effusions. Aortic vascular calcification. Electronically Signed   By: Annia Belt M.D.   On: 05/24/2016 09:52   Dg Chest Portable 1 View  Result Date: 05/23/2016 CLINICAL DATA:  Shortness of breath beginning this morning. EXAM: PORTABLE CHEST 1 VIEW COMPARISON:  Single-view of the chest 05/12/2016 and 05/08/2016. CT chest 05/08/2016. FINDINGS: The lungs are emphysematous. Since the most recent examination,  right basilar airspace disease has worsened. The patient has a new small left pleural effusion with increased left basilar airspace disease. Small right effusion is noted. No pneumothorax. Heart size is normal. 1 cm left upper lobe nodule seen on the prior chest CT is not visible on this examination. Aortic atherosclerosis is noted. No focal bony abnormality. IMPRESSION: Increased bibasilar airspace disease and small effusions worrisome for pneumonia and/or pulmonary edema. Emphysema. Atherosclerosis. Electronically Signed   By: Drusilla Kanner M.D.   On: 05/23/2016 13:50   Dg Chest Port 1 View  Result Date: 05/12/2016 CLINICAL DATA:  Shortness of breath last night.  COPD EXAM: PORTABLE CHEST 1 VIEW COMPARISON:  05/08/2016 FINDINGS: Normal heart size. No pleural effusion or edema. Aortic atherosclerosis. Moderate right pleural effusion is identified and is increased from 05/08/2016. Chronic interstitial coarsening is noted throughout both lungs. There is diminished aeration to the right base compatible with atelectasis and/or pneumonia. IMPRESSION: 1. New right pleural effusion 2. Worsening aeration to the right lung base. Electronically Signed   By: Signa Kell M.D.   On: 05/12/2016 09:28    Microbiology: No results found for this or any previous visit (from the past 240 hour(s)).   Labs: Basic Metabolic Panel:  Recent Labs Lab 05/30/16 0253 05/31/16 0315 06/01/16 0237 06/02/16 0341 06/03/16 0538  NA 139 141 139 140 143  K 3.7 3.6 3.6 3.6 3.5  CL 102 102 100* 100* 103  CO2 28 31 30 31 31   GLUCOSE 153* 135* 131* 106* 93  BUN 39* 48* 56* 61* 67*  CREATININE 1.60* 1.81* 2.13* 2.22* 2.22*  CALCIUM 8.2* 8.1* 8.3* 8.4* 8.6*   Liver Function Tests:  Recent Labs Lab 05/28/16 0329  AST 40  ALT 32  ALKPHOS 38  BILITOT 0.4  PROT 4.2*  ALBUMIN 2.4*   No results for input(s): LIPASE, AMYLASE in the last 168 hours. No results for input(s): AMMONIA in the last 168  hours. CBC:  Recent Labs Lab 05/30/16 0253 05/31/16 0315 06/01/16 0237 06/02/16 0341 06/03/16 0538  WBC 6.6 8.8 8.3 8.6 9.2  HGB 10.4* 9.1* 8.8* 9.4* 9.3*  HCT 32.2* 28.7* 27.1* 30.0* 29.2*  MCV 85.2 86.7 86.6 88.0 86.4  PLT 148* 177 185 211 223   Cardiac Enzymes: No results for input(s): CKTOTAL, CKMB, CKMBINDEX, TROPONINI in the last 168 hours. BNP: BNP (last 3 results)  Recent Labs  05/08/16 0937 05/23/16 1330  BNP 908.0* 1,216.0*    ProBNP (last 3 results) No results for input(s): PROBNP in the last 8760 hours.  CBG: No results for input(s): GLUCAP in the last 168 hours.     SignedEdsel Petrin  [facility] 06/03/2016, 10:47 AM

## 2016-06-07 DIAGNOSIS — I480 Paroxysmal atrial fibrillation: Secondary | ICD-10-CM

## 2016-06-07 HISTORY — DX: Paroxysmal atrial fibrillation: I48.0

## 2016-06-10 ENCOUNTER — Other Ambulatory Visit: Payer: Self-pay | Admitting: Cardiology

## 2016-06-10 ENCOUNTER — Encounter (INDEPENDENT_AMBULATORY_CARE_PROVIDER_SITE_OTHER): Payer: Self-pay

## 2016-06-10 ENCOUNTER — Encounter: Payer: Self-pay | Admitting: Cardiology

## 2016-06-10 ENCOUNTER — Other Ambulatory Visit (HOSPITAL_COMMUNITY)
Admission: RE | Admit: 2016-06-10 | Discharge: 2016-06-10 | Disposition: A | Discharge status: Home / Self Care | Payer: Commercial Managed Care - HMO | Source: Ambulatory Visit | Attending: Cardiology | Admitting: Cardiology

## 2016-06-10 ENCOUNTER — Ambulatory Visit (INDEPENDENT_AMBULATORY_CARE_PROVIDER_SITE_OTHER): Payer: Commercial Managed Care - HMO | Admitting: Cardiology

## 2016-06-10 VITALS — BP 158/68 | HR 74 | Ht <= 58 in | Wt 119.0 lb

## 2016-06-10 DIAGNOSIS — D649 Anemia, unspecified: Secondary | ICD-10-CM | POA: Diagnosis not present

## 2016-06-10 DIAGNOSIS — N289 Disorder of kidney and ureter, unspecified: Secondary | ICD-10-CM | POA: Insufficient documentation

## 2016-06-10 DIAGNOSIS — K661 Hemoperitoneum: Secondary | ICD-10-CM

## 2016-06-10 DIAGNOSIS — I10 Essential (primary) hypertension: Secondary | ICD-10-CM

## 2016-06-10 DIAGNOSIS — R911 Solitary pulmonary nodule: Secondary | ICD-10-CM

## 2016-06-10 DIAGNOSIS — J9601 Acute respiratory failure with hypoxia: Secondary | ICD-10-CM

## 2016-06-10 DIAGNOSIS — N183 Chronic kidney disease, stage 3 unspecified: Secondary | ICD-10-CM

## 2016-06-10 DIAGNOSIS — J42 Unspecified chronic bronchitis: Secondary | ICD-10-CM | POA: Diagnosis not present

## 2016-06-10 LAB — BASIC METABOLIC PANEL
Anion gap: 7 (ref 5–15)
BUN: 67 mg/dL — ABNORMAL HIGH (ref 6–20)
CO2: 30 mmol/L (ref 22–32)
Calcium: 8.5 mg/dL — ABNORMAL LOW (ref 8.9–10.3)
Chloride: 101 mmol/L (ref 101–111)
Creatinine, Ser: 1.91 mg/dL — ABNORMAL HIGH (ref 0.44–1.00)
GFR calc Af Amer: 28 mL/min — ABNORMAL LOW (ref >60)
GFR calc non Af Amer: 24 mL/min — ABNORMAL LOW (ref >60)
Glucose, Bld: 130 mg/dL — ABNORMAL HIGH (ref 65–99)
Potassium: 4.1 mmol/L (ref 3.5–5.1)
Sodium: 138 mmol/L (ref 135–145)

## 2016-06-10 LAB — CBC WITH DIFFERENTIAL/PLATELET
Basophils Absolute: 0 10*3/uL (ref 0.0–0.1)
Basophils Relative: 0 %
Eosinophils Absolute: 0 10*3/uL (ref 0.0–0.7)
Eosinophils Relative: 0 %
HCT: 32.7 % — ABNORMAL LOW (ref 36.0–46.0)
Hemoglobin: 10.6 g/dL — ABNORMAL LOW (ref 12.0–15.0)
Lymphocytes Relative: 3 %
Lymphs Abs: 0.3 10*3/uL — ABNORMAL LOW (ref 0.7–4.0)
MCH: 28.9 pg (ref 26.0–34.0)
MCHC: 32.4 g/dL (ref 30.0–36.0)
MCV: 89.1 fL (ref 78.0–100.0)
Monocytes Absolute: 0.9 10*3/uL (ref 0.1–1.0)
Monocytes Relative: 7 %
Neutro Abs: 12.1 10*3/uL — ABNORMAL HIGH (ref 1.7–7.7)
Neutrophils Relative %: 90 %
Platelets: 204 10*3/uL (ref 150–400)
RBC: 3.67 MIL/uL — ABNORMAL LOW (ref 3.87–5.11)
RDW: 17.1 % — ABNORMAL HIGH (ref 11.5–15.5)
WBC: 13.4 10*3/uL — ABNORMAL HIGH (ref 4.0–10.5)

## 2016-06-10 NOTE — Assessment & Plan Note (Signed)
Last SCr was 2.2 giving her a GFR of 23 (stage 4)

## 2016-06-10 NOTE — Assessment & Plan Note (Addendum)
Pulmonary nodule suspicious for malignancy on chest CT- pulmonary follow with Dr Juanetta Gosling is scheduled.  She was discharged on home O2- new for her

## 2016-06-10 NOTE — Assessment & Plan Note (Signed)
Uncontrolled on admission 7/18

## 2016-06-10 NOTE — Patient Instructions (Signed)
Medication Instructions:  Your physician recommends that you continue on your current medications as directed. Please refer to the Current Medication list given to you today.  Labwork: Today: CBC w/ diff and BMP  Testing/Procedures: None ordered  Follow-Up: Your physician recommends that you schedule a follow-up appointment in: 8 weeks with Dr. Diona Browner  If you need a refill on your cardiac medications before your next appointment, please call your pharmacy.  Thank you for choosing [facility]!!

## 2016-06-10 NOTE — Assessment & Plan Note (Signed)
Admitted 05/24/16 with combined COPD and diastolic CHF

## 2016-06-10 NOTE — Progress Notes (Signed)
06/10/2016 Sydney Jacobs   Jun 28, 1934  [id number]  Primary Physician Colette Ribas, MD Primary Cardiologist: Dr Diona Browner (new)  HPI:  82 y/o female admitted earlier in July with hypertensive urgency and COPD exacerbation, admitted again 05/24/16 with respiratory failure. With slightly elevated Troponin. She was transferred to [facility] for cath which revealed mild CAD and mildly elevated LVEDP. It was felt she had a combination of acute on chronic COPD exacerbation and diastolic CHF. Post cath she developed a RPH and need to be transfused. She also had acute on chronic renal insufficiency. She was discharge 7/28. She is in the office today for follow up. She says she has done well since discharge, she was sent home on home O2- new for her.    Current Outpatient Prescriptions  Medication Sig Dispense Refill  . allopurinol (ZYLOPRIM) 100 MG tablet Take 1 tablet by mouth at bedtime.     Marland Kitchen amLODipine (NORVASC) 10 MG tablet Take 1 tablet (10 mg total) by mouth daily. 30 tablet 0  . atorvastatin (LIPITOR) 20 MG tablet Take 1 tablet (20 mg total) by mouth daily at 6 PM. 30 tablet 0  . cetirizine (ZYRTEC) 10 MG tablet Take 10 mg by mouth daily.    . furosemide (LASIX) 20 MG tablet Take 1 tablet (20 mg total) by mouth daily. 30 tablet 0  . hydrALAZINE (APRESOLINE) 50 MG tablet Take 1 tablet (50 mg total) by mouth every 6 (six) hours. 120 tablet 0  . metoprolol (LOPRESSOR) 50 MG tablet Take 1 tablet (50 mg total) by mouth 2 (two) times daily. 60 tablet 0  . pantoprazole (PROTONIX) 40 MG tablet Take 1 tablet (40 mg total) by mouth daily. 30 tablet 0  . predniSONE (DELTASONE) 10 MG tablet Take 40mg  (4 tabs) x 2 days, then taper to 30mg  (3 tabs) x 2 days, then 20mg  (2 tabs) x 2 days, then 10mg  (1 tab) x 2 days. 20 tablet 0  . Vitamin D, Ergocalciferol, (DRISDOL) 50000 units CAPS capsule Take 1 capsule by mouth once a week. Takes on Fridays.     No current facility-administered medications for this  visit.     Allergies  Allergen Reactions  . Sulfa Antibiotics Other (See Comments)    unknown    Social History   Social History  . Marital status: Married    Spouse name: N/A  . Number of children: N/A  . Years of education: N/A   Occupational History  . Not on file.   Social History Main Topics  . Smoking status: Former Smoker    Types: Cigarettes  . Smokeless tobacco: Never Used  . Alcohol use No  . Drug use: No  . Sexual activity: No   Other Topics Concern  . Not on file   Social History Narrative  . No narrative on file     Review of Systems: General: negative for chills, fever, night sweats or weight changes.  Cardiovascular: negative for chest pain, dyspnea on exertion, edema, orthopnea, palpitations, paroxysmal nocturnal dyspnea or shortness of breath Dermatological: negative for rash Respiratory: negative for cough or wheezing Urologic: negative for hematuria Abdominal: negative for nausea, vomiting, diarrhea, bright red blood per rectum, melena, or hematemesis Neurologic: negative for visual changes, syncope, or dizziness All other systems reviewed and are otherwise negative except as noted above.    Blood pressure (!) 158/68, pulse 74, height 4\' 10"  (1.473 m), weight 119 lb (54 kg), SpO2 96 %.  General appearance: alert, cooperative, appears  stated age and no distress Neck: no JVD and LCA bruit, LCE scar Lungs: few crackles Rt, decreased breath sounds Lt base Heart: regular rate and rhythm and soft systolic murmur Skin: Skin color, texture, turgor normal. No rashes or lesions Neurologic: Grossly normal   ASSESSMENT AND PLAN:   Acute respiratory failure with hypoxia (HCC) Admitted 05/24/16 with combined COPD and diastolic CHF  CKD (chronic kidney disease) stage 3, GFR 30-59 ml/min Last SCr was 2.2 giving her a GFR of 23 (stage 4)  COPD (chronic obstructive pulmonary disease) (HCC) Pulmonary nodule suspicious for malignancy on chest CT-  pulmonary follow with Dr Juanetta Gosling is scheduled.  She was discharged on home O2- new for her  Essential hypertension Uncontrolled on admission 7/18  Lung nodule F/U with Dr Juanetta Gosling is scheduled   PLAN  Same Rx-check labs- f/u with Dr Darrick Penna and Dr Juanetta Gosling scheduled. F/U with Dr Diona Browner in 8 weeks.  Corine Shelter PA-C 06/10/2016 1:59 PM

## 2016-06-10 NOTE — Assessment & Plan Note (Signed)
F/U with Dr Juanetta Gosling is scheduled

## 2016-06-12 ENCOUNTER — Emergency Department (HOSPITAL_COMMUNITY): Payer: Commercial Managed Care - HMO

## 2016-06-12 ENCOUNTER — Encounter (HOSPITAL_COMMUNITY): Payer: Self-pay

## 2016-06-12 ENCOUNTER — Inpatient Hospital Stay (HOSPITAL_COMMUNITY)
Admission: EM | Admit: 2016-06-12 | Discharge: 2016-06-17 | DRG: 291 | Disposition: A | Discharge status: Home Health | Payer: Commercial Managed Care - HMO | Attending: Internal Medicine | Admitting: Internal Medicine

## 2016-06-12 DIAGNOSIS — Z9981 Dependence on supplemental oxygen: Secondary | ICD-10-CM | POA: Diagnosis not present

## 2016-06-12 DIAGNOSIS — J9601 Acute respiratory failure with hypoxia: Secondary | ICD-10-CM | POA: Diagnosis not present

## 2016-06-12 DIAGNOSIS — J441 Chronic obstructive pulmonary disease with (acute) exacerbation: Secondary | ICD-10-CM | POA: Diagnosis present

## 2016-06-12 DIAGNOSIS — I5033 Acute on chronic diastolic (congestive) heart failure: Secondary | ICD-10-CM | POA: Diagnosis present

## 2016-06-12 DIAGNOSIS — R0602 Shortness of breath: Secondary | ICD-10-CM | POA: Diagnosis not present

## 2016-06-12 DIAGNOSIS — Z8249 Family history of ischemic heart disease and other diseases of the circulatory system: Secondary | ICD-10-CM | POA: Diagnosis not present

## 2016-06-12 DIAGNOSIS — J9600 Acute respiratory failure, unspecified whether with hypoxia or hypercapnia: Secondary | ICD-10-CM | POA: Diagnosis present

## 2016-06-12 DIAGNOSIS — J189 Pneumonia, unspecified organism: Secondary | ICD-10-CM | POA: Diagnosis present

## 2016-06-12 DIAGNOSIS — N184 Chronic kidney disease, stage 4 (severe): Secondary | ICD-10-CM | POA: Diagnosis present

## 2016-06-12 DIAGNOSIS — E785 Hyperlipidemia, unspecified: Secondary | ICD-10-CM | POA: Diagnosis present

## 2016-06-12 DIAGNOSIS — J42 Unspecified chronic bronchitis: Secondary | ICD-10-CM | POA: Diagnosis not present

## 2016-06-12 DIAGNOSIS — I714 Abdominal aortic aneurysm, without rupture: Secondary | ICD-10-CM | POA: Diagnosis present

## 2016-06-12 DIAGNOSIS — I480 Paroxysmal atrial fibrillation: Secondary | ICD-10-CM | POA: Diagnosis present

## 2016-06-12 DIAGNOSIS — R911 Solitary pulmonary nodule: Secondary | ICD-10-CM | POA: Diagnosis present

## 2016-06-12 DIAGNOSIS — Z8673 Personal history of transient ischemic attack (TIA), and cerebral infarction without residual deficits: Secondary | ICD-10-CM | POA: Diagnosis not present

## 2016-06-12 DIAGNOSIS — Z87891 Personal history of nicotine dependence: Secondary | ICD-10-CM

## 2016-06-12 DIAGNOSIS — I5021 Acute systolic (congestive) heart failure: Secondary | ICD-10-CM

## 2016-06-12 DIAGNOSIS — J9621 Acute and chronic respiratory failure with hypoxia: Secondary | ICD-10-CM | POA: Diagnosis present

## 2016-06-12 DIAGNOSIS — J810 Acute pulmonary edema: Secondary | ICD-10-CM | POA: Diagnosis not present

## 2016-06-12 DIAGNOSIS — Y95 Nosocomial condition: Secondary | ICD-10-CM | POA: Diagnosis present

## 2016-06-12 DIAGNOSIS — I248 Other forms of acute ischemic heart disease: Secondary | ICD-10-CM | POA: Diagnosis present

## 2016-06-12 DIAGNOSIS — I130 Hypertensive heart and chronic kidney disease with heart failure and stage 1 through stage 4 chronic kidney disease, or unspecified chronic kidney disease: Secondary | ICD-10-CM | POA: Diagnosis present

## 2016-06-12 DIAGNOSIS — J440 Chronic obstructive pulmonary disease with acute lower respiratory infection: Secondary | ICD-10-CM | POA: Diagnosis present

## 2016-06-12 DIAGNOSIS — Z96641 Presence of right artificial hip joint: Secondary | ICD-10-CM | POA: Diagnosis present

## 2016-06-12 DIAGNOSIS — I5031 Acute diastolic (congestive) heart failure: Secondary | ICD-10-CM | POA: Diagnosis present

## 2016-06-12 DIAGNOSIS — Z515 Encounter for palliative care: Secondary | ICD-10-CM

## 2016-06-12 DIAGNOSIS — Z7189 Other specified counseling: Secondary | ICD-10-CM

## 2016-06-12 DIAGNOSIS — J449 Chronic obstructive pulmonary disease, unspecified: Secondary | ICD-10-CM | POA: Diagnosis present

## 2016-06-12 DIAGNOSIS — I2489 Other forms of acute ischemic heart disease: Secondary | ICD-10-CM | POA: Diagnosis present

## 2016-06-12 DIAGNOSIS — R0600 Dyspnea, unspecified: Secondary | ICD-10-CM | POA: Diagnosis not present

## 2016-06-12 LAB — CBC WITH DIFFERENTIAL/PLATELET
Basophils Absolute: 0 10*3/uL (ref 0.0–0.1)
Basophils Relative: 0 %
Eosinophils Absolute: 0.1 10*3/uL (ref 0.0–0.7)
Eosinophils Relative: 1 %
HCT: 30.9 % — ABNORMAL LOW (ref 36.0–46.0)
Hemoglobin: 10.2 g/dL — ABNORMAL LOW (ref 12.0–15.0)
LYMPHS ABS: 0.7 10*3/uL (ref 0.7–4.0)
LYMPHS PCT: 6 %
MCH: 29.2 pg (ref 26.0–34.0)
MCHC: 33 g/dL (ref 30.0–36.0)
MCV: 88.5 fL (ref 78.0–100.0)
MONOS PCT: 7 %
Monocytes Absolute: 0.8 10*3/uL (ref 0.1–1.0)
NEUTROS PCT: 86 %
Neutro Abs: 9.7 10*3/uL — ABNORMAL HIGH (ref 1.7–7.7)
PLATELETS: 138 10*3/uL — AB (ref 150–400)
RBC: 3.49 MIL/uL — AB (ref 3.87–5.11)
RDW: 17.6 % — ABNORMAL HIGH (ref 11.5–15.5)
WBC: 11.4 10*3/uL — AB (ref 4.0–10.5)

## 2016-06-12 LAB — BLOOD GAS, ARTERIAL
ACID-BASE EXCESS: 5.5 mmol/L — AB (ref 0.0–2.0)
BICARBONATE: 29.2 meq/L — AB (ref 20.0–24.0)
DRAWN BY: 221791
O2 Content: 2 L/min
O2 SAT: 89.1 %
Patient temperature: 37
TCO2: 12.1 mmol/L (ref 0–100)
pCO2 arterial: 40.9 mmHg (ref 35.0–45.0)
pH, Arterial: 7.468 — ABNORMAL HIGH (ref 7.350–7.450)
pO2, Arterial: 54.9 mmHg — ABNORMAL LOW (ref 80.0–100.0)

## 2016-06-12 LAB — COMPREHENSIVE METABOLIC PANEL
ALT: 22 U/L (ref 14–54)
AST: 19 U/L (ref 15–41)
Albumin: 3.4 g/dL — ABNORMAL LOW (ref 3.5–5.0)
Alkaline Phosphatase: 55 U/L (ref 38–126)
Anion gap: 8 (ref 5–15)
BUN: 63 mg/dL — AB (ref 6–20)
CHLORIDE: 102 mmol/L (ref 101–111)
CO2: 29 mmol/L (ref 22–32)
CREATININE: 2 mg/dL — AB (ref 0.44–1.00)
Calcium: 8.2 mg/dL — ABNORMAL LOW (ref 8.9–10.3)
GFR calc non Af Amer: 23 mL/min — ABNORMAL LOW (ref >60)
GFR, EST AFRICAN AMERICAN: 26 mL/min — AB (ref >60)
Glucose, Bld: 121 mg/dL — ABNORMAL HIGH (ref 65–99)
Potassium: 3.5 mmol/L (ref 3.5–5.1)
SODIUM: 139 mmol/L (ref 135–145)
Total Bilirubin: 1.2 mg/dL (ref 0.3–1.2)
Total Protein: 6 g/dL — ABNORMAL LOW (ref 6.5–8.1)

## 2016-06-12 LAB — TROPONIN I
Troponin I: 0.04 ng/mL (ref <0.03)
Troponin I: 0.04 ng/mL (ref <0.03)

## 2016-06-12 LAB — BRAIN NATRIURETIC PEPTIDE: B NATRIURETIC PEPTIDE 5: 2185 pg/mL — AB (ref 0.0–100.0)

## 2016-06-12 MED ORDER — ACETAMINOPHEN 325 MG PO TABS
650.0000 mg | ORAL_TABLET | ORAL | Status: DC | PRN
Start: 2016-06-12 — End: 2016-06-17
  Administered 2016-06-14: 650 mg via ORAL
  Filled 2016-06-12: qty 2

## 2016-06-12 MED ORDER — HYDRALAZINE HCL 25 MG PO TABS
50.0000 mg | ORAL_TABLET | Freq: Four times a day (QID) | ORAL | Status: DC
  Administered 2016-06-12 – 2016-06-17 (×18): 50 mg via ORAL
  Filled 2016-06-12 (×18): qty 2

## 2016-06-12 MED ORDER — SODIUM CHLORIDE 0.9% FLUSH
3.0000 mL | Freq: Two times a day (BID) | INTRAVENOUS | Status: DC
Start: 2016-06-12 — End: 2016-06-17
  Administered 2016-06-12 – 2016-06-17 (×9): 3 mL via INTRAVENOUS

## 2016-06-12 MED ORDER — METHYLPREDNISOLONE SODIUM SUCC 125 MG IJ SOLR
125.0000 mg | Freq: Once | INTRAMUSCULAR | Status: AC
  Administered 2016-06-12: 125 mg via INTRAVENOUS
  Filled 2016-06-12: qty 2

## 2016-06-12 MED ORDER — SODIUM CHLORIDE 0.9 % IV SOLN: 250.0000 mL | INTRAVENOUS | Status: DC | PRN

## 2016-06-12 MED ORDER — FUROSEMIDE 10 MG/ML IJ SOLN
40.0000 mg | Freq: Two times a day (BID) | INTRAMUSCULAR | Status: DC
  Administered 2016-06-12 – 2016-06-13 (×2): 40 mg via INTRAVENOUS
  Filled 2016-06-12 (×2): qty 4

## 2016-06-12 MED ORDER — ATORVASTATIN CALCIUM 20 MG PO TABS
20.0000 mg | ORAL_TABLET | Freq: Every day | ORAL | Status: DC
  Administered 2016-06-12 – 2016-06-16 (×4): 20 mg via ORAL
  Filled 2016-06-12 (×4): qty 1

## 2016-06-12 MED ORDER — SODIUM CHLORIDE 0.9% FLUSH: 3.0000 mL | INTRAVENOUS | Status: DC | PRN

## 2016-06-12 MED ORDER — ALLOPURINOL 100 MG PO TABS
100.0000 mg | ORAL_TABLET | Freq: Every day | ORAL | Status: DC
  Administered 2016-06-12 – 2016-06-16 (×2): 100 mg via ORAL
  Filled 2016-06-12 (×3): qty 1

## 2016-06-12 MED ORDER — PANTOPRAZOLE SODIUM 40 MG PO TBEC
40.0000 mg | DELAYED_RELEASE_TABLET | Freq: Every day | ORAL | Status: DC
  Administered 2016-06-12 – 2016-06-17 (×6): 40 mg via ORAL
  Filled 2016-06-12 (×6): qty 1

## 2016-06-12 MED ORDER — ONDANSETRON HCL 4 MG/2ML IJ SOLN: 4.0000 mg | Freq: Four times a day (QID) | INTRAMUSCULAR | Status: DC | PRN

## 2016-06-12 MED ORDER — ALBUTEROL SULFATE (2.5 MG/3ML) 0.083% IN NEBU
5.0000 mg | INHALATION_SOLUTION | Freq: Once | RESPIRATORY_TRACT | Status: AC
  Administered 2016-06-12: 5 mg via RESPIRATORY_TRACT
  Filled 2016-06-12: qty 6

## 2016-06-12 MED ORDER — ENOXAPARIN SODIUM 30 MG/0.3ML ~~LOC~~ SOLN: 30.0000 mg | SUBCUTANEOUS | Status: DC

## 2016-06-12 MED ORDER — ALBUTEROL (5 MG/ML) CONTINUOUS INHALATION SOLN: 10.0000 mg/h | INHALATION_SOLUTION | RESPIRATORY_TRACT | Status: AC

## 2016-06-12 MED ORDER — FUROSEMIDE 10 MG/ML IJ SOLN
40.0000 mg | INTRAMUSCULAR | Status: AC
  Administered 2016-06-12: 40 mg via INTRAVENOUS
  Filled 2016-06-12: qty 4

## 2016-06-12 NOTE — H&P (Signed)
History and Physical  Sydney Jacobs ZOX:[id number] DOB: Jul 03, 1934 DOA: 06/12/2016  PCP: Colette Ribas, MD  Patient coming from: home  Chief Complaint: SOB  HPI:  82 year old woman with chronic diastolic heart failure, third admission in 6 weeks, presented to the emergency department with acute shortness of breath. Initial evaluation suggested acute on chronic hypoxia and diastolic heart failure.   Admitted early July with hypertensive urgency, COPD exacerbation, acute diastolic heart failure.   Secondadmission July 17-28 with acute respiratory failure with hypoxia, acute on chronic diastolic congestive heart failure. [facility]fered to [facility] which revealed mild coronary artery disease and mildly elevated LVEDP. Post catheterization she developed a retroperitoneal hematoma requiring transfusion.   She was seen in the office by cardiology August 4 and was noted to be doing well at that time.  8/4 was a good day, no issues. Did well 8/5. That evening she awoke from sleep with acute onset of shortness of breath. No chest pain. No specific aggravating or alleviating factors mentioned. She's been compliant with diet and medications.  In the emergency department: afebrile, VSS, hypoxic  Pertinent labs: ABG 7.46/40/54. BUN/creatinine stable compared to laboratory studies from August 4 and 7/28. BNP 2185. Troponin 0.04. EKG: Independently reviewed. Sinus rhythm, anterior infarct, no acute changes. Imaging: Chest x-ray independently reviewed. Pulmonary edema bilaterally. Possible left pleural effusion.  Review of Systems:  Negative for fever, visual changes, sore throat, rash, new muscle aches, chest pain, dysuria, bleeding, n/v/abdominal pain.  Past Medical History:  Diagnosis Date  . Chronic diastolic CHF (congestive heart failure) (HCC)   . CKD (chronic kidney disease) stage 3, GFR 30-59 ml/min   . COPD (chronic obstructive pulmonary disease) (HCC)   . Essential  hypertension   . History of stroke   . Hyperlipidemia   . Pulmonary nodule    Left upper lobe 1 cm pulmonary nodule - chest CT July 2017    Past Surgical History:  Procedure Laterality Date  . ABDOMINAL HYSTERECTOMY    . APPENDECTOMY    . CARDIAC CATHETERIZATION N/A 05/26/2016   Procedure: Left Heart Cath and Coronary Angiography;  Surgeon: Corky Crafts, MD;  Location: [facility] INVASIVE CV LAB;  Service: Cardiovascular;  Laterality: N/A;  . CAROTID ENDARTERECTOMY Left   . HEMORRHOID SURGERY    . PARTIAL HIP ARTHROPLASTY    . TONSILLECTOMY       reports that she has quit smoking. Her smoking use included Cigarettes. She has never used smokeless tobacco. She reports that she does not drink alcohol or use drugs.   Allergies  Allergen Reactions  . Sulfa Antibiotics Other (See Comments)    unknown    Family History  Problem Relation Age of Onset  . Hypertension Mother   . Hypertension Father      Prior to Admission medications   Medication Sig Start Date End Date Taking? Authorizing Provider  allopurinol (ZYLOPRIM) 100 MG tablet Take 1 tablet by mouth at bedtime.  03/23/16   Historical Provider, MD  amLODipine (NORVASC) 10 MG tablet Take 1 tablet (10 mg total) by mouth daily. 05/13/16   Standley Brooking, MD  atorvastatin (LIPITOR) 20 MG tablet Take 1 tablet (20 mg total) by mouth daily at 6 PM. 06/03/16   Maryann Mikhail, DO  cetirizine (ZYRTEC) 10 MG tablet Take 10 mg by mouth daily.    Historical Provider, MD  furosemide (LASIX) 20 MG tablet Take 1 tablet (20 mg total) by mouth daily. 06/03/16   Edsel Petrin, DO  hydrALAZINE (APRESOLINE) 50 MG tablet Take 1 tablet (50 mg total) by mouth every 6 (six) hours. 06/03/16   Maryann Mikhail, DO  metoprolol (LOPRESSOR) 50 MG tablet Take 1 tablet (50 mg total) by mouth 2 (two) times daily. 06/03/16   Maryann Mikhail, DO  pantoprazole (PROTONIX) 40 MG tablet Take 1 tablet (40 mg total) by mouth daily. 06/03/16   Maryann Mikhail, DO    predniSONE (DELTASONE) 10 MG tablet Take 40mg  (4 tabs) x 2 days, then taper to 30mg  (3 tabs) x 2 days, then 20mg  (2 tabs) x 2 days, then 10mg  (1 tab) x 2 days. 06/03/16   Maryann Mikhail, DO  Vitamin D, Ergocalciferol, (DRISDOL) 50000 units CAPS capsule Take 1 capsule by mouth once a week. Takes on Fridays. 03/23/16   Historical Provider, MD    Physical Exam: Vitals:   06/12/16 1430 06/12/16 1547 06/12/16 1608 06/12/16 1611  BP: 153/73 163/65  (!) 172/72  Pulse: 80 83  86  Resp: 16 20    Temp:  98.2 F (36.8 C) 98.6 F (37 C) 98.5 F (36.9 C)  TempSrc:  Oral Oral Oral  SpO2: 92% 93%  92%  Weight:    56.4 kg (124 lb 5.4 oz)  Height:    4\' 10"  (1.473 m)    Constitutional:  . Appears Ill but not toxic. Mildly uncomfortable. Eyes:  . PERRL and irises appear normal . Normal conjunctivae and lids ENMT:  . external ears, nose appear normal . grossly normal hearing . Lips appear normal Neck:  . neck appears normal, no masses, normal ROM, supple . no thyromegaly Respiratory:  . CTA bilaterally, no w/r/r.  . Respiratory effort mild to moderately increased. No retractions or accessory muscle use Cardiovascular:  . RRR, no m/r/g . No LE extremity edema   Abdomen:  . Abdomen appears normal; no tenderness or masses Musculoskeletal:  . RUE, LUE, RLE, LLE   o strength and tone normal, no atrophy, no abnormal movements o No tenderness, masses Skin:  . No rashes. Significant bruising over the lower abdomen and the groin. . palpation of skin: no induration or nodules Neurologic:  . Grossly normal Psychiatric:  . judgement and insight appear normal . Mental status o Orientation to person, place, time  o Mood, affect appropriate  Wt Readings from Last 3 Encounters:  06/12/16 56.4 kg (124 lb 5.4 oz)  06/10/16 54 kg (119 lb)  06/03/16 53.4 kg (117 lb 11.2 oz)    I have personally reviewed following labs and imaging studies  Labs on Admission:  CBC:  Recent Labs Lab  06/10/16 1412 06/12/16 1237  WBC 13.4* 11.4*  NEUTROABS 12.1* 9.7*  HGB 10.6* 10.2*  HCT 32.7* 30.9*  MCV 89.1 88.5  PLT 204 138*   Basic Metabolic Panel:  Recent Labs Lab 06/10/16 1412 06/12/16 1237  NA 138 139  K 4.1 3.5  CL 101 102  CO2 30 29  GLUCOSE 130* 121*  BUN 67* 63*  CREATININE 1.91* 2.00*  CALCIUM 8.5* 8.2*   Liver Function Tests:  Recent Labs Lab 06/12/16 1237  AST 19  ALT 22  ALKPHOS 55  BILITOT 1.2  PROT 6.0*  ALBUMIN 3.4*   Cardiac Enzymes:  Recent Labs Lab 06/12/16 1237  TROPONINI 0.04*     Radiological Exams on Admission: Dg Chest Port 1 View  Result Date: 06/12/2016 CLINICAL DATA:  Shortness of breath EXAM: PORTABLE CHEST 1 VIEW COMPARISON:  06/01/2016 FINDINGS: The heart size appears normal. There is aortic atherosclerosis.  Moderate diffuse pulmonary edema is identified. There are bilateral lower lobe airspace opacities noted peer IMPRESSION: 1. Suspect CHF. 2. Bilateral lower lobe opacities which may reflect areas of edema or infection. Electronically Signed   By: Signa Kellaylor  Stroud M.D.   On: 06/12/2016 13:40    EKG: Independently reviewed. As above.  Principal Problem:   Acute on chronic diastolic CHF (congestive heart failure) (HCC) Active Problems:   Acute respiratory failure with hypoxia (HCC)   COPD (chronic obstructive pulmonary disease) (HCC)   Acute pulmonary edema (HCC)   Assessment/Plan 1. Acute on chronic hypoxic respiratory failure secondary to acute CHF and underlying COPD.  2. Acute on chronic diastolic CHF. Pulmonary edema and chest x-ray. BNP greater than 2000. 3. Demand ischemia secondary to acute CHF. No evidence of ACS. 4. Oxygen dependent COPD appears to be at baseline. 5. Chronic kidney disease stage IV, appears to be at baseline. 6. Retroperitoneal hematoma 06/10/2016, hemoglobin stable. 7. Abdominal aortic aneurysm, follow-up with vascular surgery as an outpatient 8. Pulmonary nodule concerning for malignancy.  Follow-up as an outpatient.   Oxygenation and hemodynamics currently stable. Admit to stepdown.  IV diuresis. Continue oxygen.  CBC, BMP in a.m.  Cardiology consultation in a.m.  DVT prophylaxis:Lovenox Code Status: full cold Family Communication: husband at bedside Admission status: admit    Time spent: 750 minutes  Brendia Sacksaniel Domitila Stetler, MD  [facility] Direct contact: [phone] --Via amion app OR  --[email or web address]; password [email or web address]  7PM-7AM contact night coverage as above  06/12/2016, 4:18 PM

## 2016-06-12 NOTE — ED Provider Notes (Signed)
[facility]-EMERGENCY DEPT Provider Note   CSN: [id number] Arrival date & time: 06/12/16  1205  First Provider Contact:  12:21 PM    By signing my name below, I, Placido Sou, attest that this documentation has been prepared under the direction and in the presence of Eber Hong, MD. Electronically Signed: Placido Sou, ED Scribe. 06/12/16. 12:43 PM.   History   Chief Complaint Chief Complaint  Patient presents with  . Shortness of Breath    HPI HPI Comments: Sydney Jacobs is a 82 y.o. female who is on home O2 for COPD presents to the Emergency Department complaining of worsening, moderate, SOB onset last night.  She reports associated, moderate, cough. Her son reports she was recently evaluated by her cardiologist and had a catheterization performed with no abnormalities further noting she was admitted twice in July for SOB. She uses an inhaler at home which she used twice this morning w/o relief. Pt denies fever, chills, leg swelling and weight gain.   The history is provided by the patient and a relative. No language interpreter was used.    Past Medical History:  Diagnosis Date  . Chronic diastolic CHF (congestive heart failure) (HCC)   . CKD (chronic kidney disease) stage 3, GFR 30-59 ml/min   . COPD (chronic obstructive pulmonary disease) (HCC)   . Essential hypertension   . History of stroke   . Hyperlipidemia   . Pulmonary nodule    Left upper lobe 1 cm pulmonary nodule - chest CT July 2017    Patient Active Problem List   Diagnosis Date Noted  . Acute pulmonary edema (HCC) 06/12/2016  . Retroperitoneal hematoma 06/10/2016  . Pressure ulcer 05/29/2016  . Anemia   . Acute respiratory failure with hypoxia (HCC) 05/23/2016  . Acute on chronic diastolic CHF (congestive heart failure) (HCC) 05/23/2016  . COPD (chronic obstructive pulmonary disease) (HCC) 05/23/2016  . AKI (acute kidney injury) (HCC) 05/11/2016  . Accelerated hypertension 05/11/2016  . COPD  exacerbation (HCC) 05/11/2016  . Essential hypertension   . Lung nodule   . CKD (chronic kidney disease) stage 3, GFR 30-59 ml/min 05/09/2016  . Tick bite 05/08/2016  . Elevated troponin 05/08/2016    Past Surgical History:  Procedure Laterality Date  . ABDOMINAL HYSTERECTOMY    . APPENDECTOMY    . CARDIAC CATHETERIZATION N/A 05/26/2016   Procedure: Left Heart Cath and Coronary Angiography;  Surgeon: Corky Crafts, MD;  Location: [facility] INVASIVE CV LAB;  Service: Cardiovascular;  Laterality: N/A;  . CAROTID ENDARTERECTOMY Left   . HEMORRHOID SURGERY    . PARTIAL HIP ARTHROPLASTY    . TONSILLECTOMY      OB History    No data available       Home Medications    Prior to Admission medications   Medication Sig Start Date End Date Taking? Authorizing Provider  allopurinol (ZYLOPRIM) 100 MG tablet Take 1 tablet by mouth at bedtime.  03/23/16   Historical Provider, MD  amLODipine (NORVASC) 10 MG tablet Take 1 tablet (10 mg total) by mouth daily. 05/13/16   Standley Brooking, MD  atorvastatin (LIPITOR) 20 MG tablet Take 1 tablet (20 mg total) by mouth daily at 6 PM. 06/03/16   Maryann Mikhail, DO  cetirizine (ZYRTEC) 10 MG tablet Take 10 mg by mouth daily.    Historical Provider, MD  furosemide (LASIX) 20 MG tablet Take 1 tablet (20 mg total) by mouth daily. 06/03/16   Maryann Mikhail, DO  hydrALAZINE (APRESOLINE) 50 MG  tablet Take 1 tablet (50 mg total) by mouth every 6 (six) hours. 06/03/16   Maryann Mikhail, DO  metoprolol (LOPRESSOR) 50 MG tablet Take 1 tablet (50 mg total) by mouth 2 (two) times daily. 06/03/16   Maryann Mikhail, DO  pantoprazole (PROTONIX) 40 MG tablet Take 1 tablet (40 mg total) by mouth daily. 06/03/16   Maryann Mikhail, DO  predniSONE (DELTASONE) 10 MG tablet Take 40mg  (4 tabs) x 2 days, then taper to 30mg  (3 tabs) x 2 days, then 20mg  (2 tabs) x 2 days, then 10mg  (1 tab) x 2 days. 06/03/16   Maryann Mikhail, DO  Vitamin D, Ergocalciferol, (DRISDOL) 50000 units CAPS  capsule Take 1 capsule by mouth once a week. Takes on Fridays. 03/23/16   Historical Provider, MD    Family History Family History  Problem Relation Age of Onset  . Hypertension Mother   . Hypertension Father     Social History Social History  Substance Use Topics  . Smoking status: Former Smoker    Types: Cigarettes  . Smokeless tobacco: Never Used  . Alcohol use No     Allergies   Sulfa antibiotics   Review of Systems Review of Systems  Constitutional: Negative for chills, fever and unexpected weight change.  Respiratory: Positive for cough and shortness of breath.   Cardiovascular: Negative for leg swelling.  All other systems reviewed and are negative.  Physical Exam Updated Vital Signs BP 179/64   Pulse 81   Temp 97.8 F (36.6 C) (Oral)   Resp 23   Ht 4\' 10"  (1.473 m)   Wt 119 lb (54 kg)   SpO2 (!) 87%   BMI 24.87 kg/m   Physical Exam  Constitutional: She is oriented to person, place, and time. She appears well-developed and well-nourished. No distress.  Speaks in three word sentences  HENT:  Head: Normocephalic and atraumatic.  Eyes: EOM are normal.  Neck: Normal range of motion. No JVD present.  No JVD  Cardiovascular: Normal rate, regular rhythm and normal heart sounds.  Exam reveals no gallop and no friction rub.   No murmur heard. Pulmonary/Chest: Tachypnea noted. She is in respiratory distress. She has rales.  Rales noted to bases bilaterally with decreased air movement. Tachypneic with increased work of breathing.   Abdominal: Soft. She exhibits no distension. There is no tenderness.  Musculoskeletal: Normal range of motion. She exhibits no edema.  No LE edema  Neurological: She is alert and oriented to person, place, and time.  Skin: Skin is warm and dry.  Psychiatric: She has a normal mood and affect. Judgment normal.  Nursing note and vitals reviewed.  ED Treatments / Results  Labs (all labs ordered are listed, but only abnormal results  are displayed) Labs Reviewed  CBC WITH DIFFERENTIAL/PLATELET - Abnormal; Notable for the following:       Result Value   WBC 11.4 (*)    RBC 3.49 (*)    Hemoglobin 10.2 (*)    HCT 30.9 (*)    RDW 17.6 (*)    Platelets 138 (*)    Neutro Abs 9.7 (*)    All other components within normal limits  COMPREHENSIVE METABOLIC PANEL - Abnormal; Notable for the following:    Glucose, Bld 121 (*)    BUN 63 (*)    Creatinine, Ser 2.00 (*)    Calcium 8.2 (*)    Total Protein 6.0 (*)    Albumin 3.4 (*)    GFR calc non Af Amer 23 (*)  GFR calc Af Amer 26 (*)    All other components within normal limits  TROPONIN I - Abnormal; Notable for the following:    Troponin I 0.04 (*)    All other components within normal limits  BRAIN NATRIURETIC PEPTIDE - Abnormal; Notable for the following:    B Natriuretic Peptide 2,185.0 (*)    All other components within normal limits  BLOOD GAS, ARTERIAL - Abnormal; Notable for the following:    pH, Arterial 7.468 (*)    pO2, Arterial 54.9 (*)    Bicarbonate 29.2 (*)    Acid-Base Excess 5.5 (*)    All other components within normal limits    EKG  EKG Interpretation  Date/Time:  Sunday June 12 2016 12:17:41 EDT Ventricular Rate:  78 PR Interval:    QRS Duration: 116 QT Interval:  429 QTC Calculation: 489 R Axis:   -45 Text Interpretation:  Sinus rhythm Atrial premature complexes Nonspecific IVCD with LAD Anterior infarct, old since last tracing no significant change Confirmed by Hyacinth MeekerMILLER  MD, Nastasha Reising ([id number]) on 06/12/2016 12:39:23 PM       Radiology Dg Chest Port 1 View  Result Date: 06/12/2016 CLINICAL DATA:  Shortness of breath EXAM: PORTABLE CHEST 1 VIEW COMPARISON:  06/01/2016 FINDINGS: The heart size appears normal. There is aortic atherosclerosis. Moderate diffuse pulmonary edema is identified. There are bilateral lower lobe airspace opacities noted peer IMPRESSION: 1. Suspect CHF. 2. Bilateral lower lobe opacities which may reflect areas of edema  or infection. Electronically Signed   By: Signa Kellaylor  Stroud M.D.   On: 06/12/2016 13:40    Procedures Procedures  COORDINATION OF CARE: 12:29 PM Discussed next steps with pt. Pt verbalized understanding and is agreeable with the plan.    Medications Ordered in ED Medications  albuterol (PROVENTIL,VENTOLIN) solution continuous neb (10 mg/hr Nebulization Not Given 06/12/16 1311)  albuterol (PROVENTIL) (2.5 MG/3ML) 0.083% nebulizer solution 5 mg (5 mg Nebulization Given 06/12/16 1241)  methylPREDNISolone sodium succinate (SOLU-MEDROL) 125 mg/2 mL injection 125 mg (125 mg Intravenous Given 06/12/16 1239)  furosemide (LASIX) injection 40 mg (40 mg Intravenous Given 06/12/16 1415)     Initial Impression / Assessment and Plan / ED Course  I have reviewed the triage vital signs and the nursing notes.  Pertinent labs & imaging results that were available during my care of the patient were reviewed by me and considered in my medical decision making (see chart for details).  Clinical Course  Comment By Time  No drop in Hgb, WBC slightly elevated but on Prednisone.  ABG with hypoxia as expected. Eber HongBrian Ruxin Ransome, MD 08/06 1319  The pt has had ongoing SOB, and xray shows taht she has likely pulmonary edema, this mixed with the BNP of > 2000 is suggestive.  She has been given neb, steroids and lasix, she needs increased amounts of O2 from baseline and is still in the 89% range - she has Cr of 2.0 which is close to when she was d/c.  She will need to be admitted - Paged Dr. Merrie RoofLe Jada Fass, MD 08/06 1348  D/w Dr. Irene LimboGoodrich - pt does have pulmonary edema and has ongoing hypertension and increased O2 need - will go to step down- aoppearing critically ill Eber HongBrian Jaxsyn Azam, MD 08/06 1428    Pt was admitted July 17 and d/c July 28 and instructed to followed up with Dr. Juanetta GoslingHawkins in pulmonary and Dr. Joyce GrossKay in cardiology. Pt was also admitted at beginning of July for SOB which was thought to  be a combination of CHF and COPD. She  was diuresed and developed renal failure. Her echocardiogram from 7/3 showed nml EF with grade 1 diastolic dysfunction. She was transfused 2 units of blood on 05/28/2016 and developed a right pelvic retroperitoneal hematoma post heart catheterization which was performed on 05/26/2016. She was found to have a 4.5 cm AAA and recommended for out patient follow up. Her heart catheterization on 05/26/2016 showed mild CAD and had a creatinine of 2.2 upon d/c. She was then sent home on prednisone. On 05/08/2016 pt received an angio of her chest which showed no PE but did show a 1 cm nodule for which she has been encouraged to f/u with pulmonary as an outpatient.   I personally performed the services described in this documentation, which was scribed in my presence. The recorded information has been reviewed and is accurate.   CRITICAL CARE Performed by: Vida Roller Total critical care time: 35 minutes Critical care time was exclusive of separately billable procedures and treating other patients. Critical care was necessary to treat or prevent imminent or life-threatening deterioration. Critical care was time spent personally by me on the following activities: development of treatment plan with patient and/or surrogate as well as nursing, discussions with consultants, evaluation of patient's response to treatment, examination of patient, obtaining history from patient or surrogate, ordering and performing treatments and interventions, ordering and review of laboratory studies, ordering and review of radiographic studies, pulse oximetry and re-evaluation of patient's condition.   Final Clinical Impressions(s) / ED Diagnoses   Final diagnoses:  Acute systolic congestive heart failure (HCC)  Acute pulmonary edema (HCC)    New Prescriptions New Prescriptions   No medications on file     Eber Hong, MD 06/12/16 1432

## 2016-06-12 NOTE — ED Triage Notes (Signed)
Patient complains of shortness of breath that started last night. Patient denies pain. [facility]. Patient normally on 2 LPM at home. O2 increased to 4 lpm now sating 95%.

## 2016-06-12 NOTE — ED Notes (Signed)
CRITICAL VALUE ALERT  Critical value received:  Troponin/0.04  Date of notification:  06/12/16  Time of notification:  1335  Critical value read back:Yes.    Nurse who received alert:  Dorris Fetchaphyne Anderson, RN  MD notified (1st page):  Dr Hyacinth MeekerMiller  Time of first page:  1335

## 2016-06-13 ENCOUNTER — Inpatient Hospital Stay (HOSPITAL_COMMUNITY): Payer: Commercial Managed Care - HMO

## 2016-06-13 DIAGNOSIS — I5033 Acute on chronic diastolic (congestive) heart failure: Secondary | ICD-10-CM

## 2016-06-13 DIAGNOSIS — R0600 Dyspnea, unspecified: Secondary | ICD-10-CM

## 2016-06-13 DIAGNOSIS — J810 Acute pulmonary edema: Secondary | ICD-10-CM

## 2016-06-13 LAB — BASIC METABOLIC PANEL
ANION GAP: 10 (ref 5–15)
BUN: 69 mg/dL — ABNORMAL HIGH (ref 6–20)
CHLORIDE: 102 mmol/L (ref 101–111)
CO2: 29 mmol/L (ref 22–32)
CREATININE: 2.28 mg/dL — AB (ref 0.44–1.00)
Calcium: 7.9 mg/dL — ABNORMAL LOW (ref 8.9–10.3)
GFR calc non Af Amer: 19 mL/min — ABNORMAL LOW (ref >60)
GFR, EST AFRICAN AMERICAN: 22 mL/min — AB (ref >60)
Glucose, Bld: 177 mg/dL — ABNORMAL HIGH (ref 65–99)
Potassium: 3.6 mmol/L (ref 3.5–5.1)
SODIUM: 141 mmol/L (ref 135–145)

## 2016-06-13 LAB — ECHOCARDIOGRAM LIMITED
CHL CUP DOP CALC LVOT VTI: 25.7 cm
EERAT: 22.43
EWDT: 203 ms
FS: 31 % (ref 28–44)
Height: 58 in
IVS/LV PW RATIO, ED: 1.16
LA ID, A-P, ES: 33 mm
LA diam end sys: 33 mm
LA vol A4C: 43.7 ml
LA vol: 58.7 mL
LADIAMINDEX: 2.2 cm/m2
LAVOLIN: 39.1 mL/m2
LV E/e' medial: 22.43
LV PW d: 10.2 mm — AB (ref 0.6–1.1)
LV SIMPSON'S DISK: 62
LV TDI E'MEDIAL: 5.11
LV dias vol index: 44 mL/m2
LV dias vol: 66 mL (ref 46–106)
LV e' LATERAL: 7.67 cm/s
LVEEAVG: 22.43
LVOT peak grad rest: 7 mmHg
LVOT peak vel: 132 cm/s
LVSYSVOL: 25 mL (ref 14–42)
LVSYSVOLIN: 17 mL/m2
MV Dec: 203
MV Peak grad: 12 mmHg
MV pk A vel: 160 m/s
MVPKEVEL: 172 m/s
RV LATERAL S' VELOCITY: 14.3 cm/s
RV TAPSE: 23.6 mm
Stroke v: 41 ml
TDI e' lateral: 7.67
VTI: 166 cm
Weight: 1911.83 oz

## 2016-06-13 LAB — MRSA PCR SCREENING: MRSA by PCR: NEGATIVE

## 2016-06-13 LAB — CBC
HEMATOCRIT: 28.8 % — AB (ref 36.0–46.0)
HEMOGLOBIN: 9.6 g/dL — AB (ref 12.0–15.0)
MCH: 29.3 pg (ref 26.0–34.0)
MCHC: 33.3 g/dL (ref 30.0–36.0)
MCV: 87.8 fL (ref 78.0–100.0)
Platelets: 144 10*3/uL — ABNORMAL LOW (ref 150–400)
RBC: 3.28 MIL/uL — AB (ref 3.87–5.11)
RDW: 17.3 % — ABNORMAL HIGH (ref 11.5–15.5)
WBC: 7 10*3/uL (ref 4.0–10.5)

## 2016-06-13 LAB — TROPONIN I
Troponin I: 0.03 ng/mL (ref <0.03)
Troponin I: 0.03 ng/mL (ref <0.03)

## 2016-06-13 MED ORDER — FUROSEMIDE 10 MG/ML IJ SOLN
40.0000 mg | Freq: Every day | INTRAMUSCULAR | Status: DC
  Administered 2016-06-14: 40 mg via INTRAVENOUS
  Filled 2016-06-13: qty 4

## 2016-06-13 MED ORDER — LEVALBUTEROL HCL 0.63 MG/3ML IN NEBU
0.6300 mg | INHALATION_SOLUTION | Freq: Three times a day (TID) | RESPIRATORY_TRACT | Status: DC
  Administered 2016-06-13 – 2016-06-17 (×12): 0.63 mg via RESPIRATORY_TRACT
  Filled 2016-06-13 (×12): qty 3

## 2016-06-13 NOTE — Progress Notes (Addendum)
Primary cardiologist: Dr Simona HuhSam McDowell Consulting cardiologist: Dr Dina RichJonathan Chardae Mulkern Requesting physician: Dr Brendia Sacksaniel Goodrich Indication: SOB  Clinical Summary [name] is a 82 y.o.female history of chronic diastolic HF, CKD III, COPD on home O2 , AAA, chest pain with recent cath 05/2016 without significant CAD, retroperitoneal bleed s/p recent cath , pulmonary nodule, with multiple admissions over the last few weeks for recurrent diastolic HF and COPD exacerbation. She presents with increased SOB and DOE at home. No significant chest pain, no LE edema.     K 3.6, Cr 2.28, Hgb 9.6, Plt 144, BNP 2185( up from 1200). Cr around 1.4-1.5 a month ago.  ABG 7.47/40/55 on 2L [place] 05/28/16 CT A/P small right groin hematoma, small to moderate right pelvic retroperitoneal bleed, 4.4 cm infrarenal AAA CXR bilateral edema 05/2016 echo: LVEF 55-60%, grade I diastolic dysfunction, mild MS, mild MR 05/2016 cath: nonobstructive CAD, mildly elevated LVEDP 18 Discharge weight 06/03/16 117 lbs. Admit weight 119 lbs ER vitals: 158/68 P 74 96% Wt 119 lbs    Allergies  Allergen Reactions  . Sulfa Antibiotics Other (See Comments)    unknown    Medications Scheduled Medications: . allopurinol  100 mg Oral QHS  . atorvastatin  20 mg Oral q1800  . furosemide  40 mg Intravenous Q12H  . hydrALAZINE  50 mg Oral Q6H  . pantoprazole  40 mg Oral Daily  . sodium chloride flush  3 mL Intravenous Q12H     Infusions:     PRN Medications:  sodium chloride, acetaminophen, ondansetron (ZOFRAN) IV, sodium chloride flush   Past Medical History:  Diagnosis Date  . Chronic diastolic CHF (congestive heart failure) (HCC)   . CKD (chronic kidney disease) stage 3, GFR 30-59 ml/min   . COPD (chronic obstructive pulmonary disease) (HCC)   . Essential hypertension   . History of stroke   . Hyperlipidemia   . Pulmonary nodule    Left upper lobe 1 cm pulmonary nodule - chest CT July 2017    Past Surgical  History:  Procedure Laterality Date  . ABDOMINAL HYSTERECTOMY    . APPENDECTOMY    . CARDIAC CATHETERIZATION N/A 05/26/2016   Procedure: Left Heart Cath and Coronary Angiography;  Surgeon: Corky CraftsJayadeep S Varanasi, MD;  [facility];  Service: Cardiovascular;  Laterality: N/A;  . CAROTID ENDARTERECTOMY Left   . HEMORRHOID SURGERY    . PARTIAL HIP ARTHROPLASTY    . TONSILLECTOMY      Family History  Problem Relation Age of Onset  . Hypertension Mother   . Hypertension Father     Social History [name] reports that she has quit smoking. Her smoking use included Cigarettes. She has never used smokeless tobacco. [name] reports that she does not drink alcohol.  Review of Systems CONSTITUTIONAL: No weight loss, fever, chills, weakness or fatigue.  HEENT: Eyes: No visual loss, blurred vision, double vision or yellow sclerae. No hearing loss, sneezing, congestion, runny nose or sore throat.  SKIN: No rash or itching.  CARDIOVASCULAR: per HPI RESPIRATORY: per HPI  GASTROINTESTINAL: No anorexia, nausea, vomiting or diarrhea. No abdominal pain or blood.  GENITOURINARY: no polyuria, no dysuria NEUROLOGICAL: No headache, dizziness, syncope, paralysis, ataxia, numbness or tingling in the extremities. No change in bowel or bladder control.  MUSCULOSKELETAL: No muscle, back pain, joint pain or stiffness.  HEMATOLOGIC: No anemia, bleeding or bruising.  LYMPHATICS: No enlarged nodes. No history of splenectomy.  PSYCHIATRIC: No history of depression or anxiety.  Physical Examination Blood pressure (!) 167/100, pulse 94, temperature 97.5 F (36.4 C), temperature source Oral, resp. rate 20, height  (1.473 m), weight 119 lb 7.8 oz (54.2 kg), SpO2 91 %.  Intake/Output Summary (Last 24 hours) at 06/13/16 0841 Last data filed at 06/12/16 1845  Gross per 24 hour  Intake              480 ml  Output              350 ml  Net              130 ml    HEENT: sclera  clear, throat clear  Cardiovascular: RRR, no m/r/g, JVD to angle of jaw  Respiratory: bilateral crackles  GI: abdomen soft, NT, Nd  MSK: no Le edema  Neuro: no focal deficits  Psych: appropriate affect   Lab Results  Basic Metabolic Panel:  Recent Labs Lab 06/10/16 1412 06/12/16 1237 06/13/16 0540  NA 138 139 141  K 4.1 3.5 3.6  CL 101 102 102  CO2 GLUCOSE 130* 121* 177*  BUN 67* 63* 69*  CREATININE 1.91* 2.00* 2.28*  CALCIUM 8.5* 8.2* 7.9*    Liver Function Tests:  Recent Labs Lab 06/12/16 1237  AST 19  ALT 22  ALKPHOS 55  BILITOT 1.2  PROT 6.0*  ALBUMIN 3.4*    CBC:  Recent Labs Lab 06/10/16 1412 06/12/16 1237 06/13/16 0540  WBC 13.4* 11.4* 7.0  NEUTROABS 12.1* 9.7*  --   HGB 10.6* 10.2* 9.6*  HCT 32.7* 30.9* 28.8*  MCV 89.1 88.5 87.8  PLT 204 138* 144*    Cardiac Enzymes:  Recent Labs Lab 06/12/16 1237 06/12/16 1742 06/13/16 0104 06/13/16 0540  TROPONINI 0.04* 0.04* 0.03* 0.03*    BNP: Invalid input(s): POCBNP     Impression/Recommendations 1. Acute on chronic diastolic HF - up 2 lbs since previous discharge according to charting, CXR with pulmonary edema and elevated BNP - overall diruesis has been limited by her renal dysfunction - I/Os are incomplete this admission, weights overall stable around 119. She is on lasix  IV bid, uptrend in Cr and BUN - fairly significant reccurent issues with volume status over the last several weeks, especially given her previous echo only showed mild diastolic dysfunction. We will repeat a limited study to evaluate for any signicant change.  - change to IV lasix  once daily today.  2. Elevated troponin - demand ischemia in setting of CHF, recent cath with nonobstructive CAD - no further workup at this time.    3. COPD - managememt per primary team  4. Retroperitoneal bleed - s/p recent cath, managed conservatively. H&H remains stable fairly stable.     Dina Rich, M.D.

## 2016-06-13 NOTE — Progress Notes (Addendum)
*  PRELIMINARY RESULTS* Echocardiogram A limited 2-D echocardiogram has been performed.  Stacey DrainWhite, Zilah Villaflor J 06/13/2016, 3:58 PM

## 2016-06-13 NOTE — Progress Notes (Signed)
PROGRESS NOTE  Sydney Jacobs VWU:[id number] DOB: July 28, 1934 DOA: 06/12/2016 PCP: Colette RibasGOLDING, JOHN CABOT, MD  Brief Narrative: [age] yof presented with complaints of shortness of breath. While in the ED she was found to be hypoxic. Pt was admitted for further treatment of chronic diastolic heart failure.  Assessment/Plan: 1. Acute on chronic hypoxic respiratory failure secondary to acute CHF and underlying COPD. appears to be improving. 2. Acute on chronic diastolic CHF. Improving. I/O not accurate. Pulmonary edema and chest x-ray. BNP greater than 2000. 3. Demand ischemia secondary to acute CHF. No evidence of ACS. 4. Oxygen dependent COPD appears to be at baseline. 5. Chronic kidney disease stage IV, appears to be at baseline. 6. Retroperitoneal hematoma 06/10/2016, hemoglobin remains stable. 7. Abdominal aortic aneurysm, follow-up with vascular surgery as an outpatient 8. Pulmonary nodule concerning for malignancy. Follow-up as an outpatient.   Appears to be improving  Agree with cardiology. Follow up echo and BMP in AM. Continue Lasix.  Check BMP in a.m.  Transfer to telemetry.  DVT prophylaxis: Lovenox  Code Status: Full Family Communication: Husband and brother-in-law bedside. Disposition Plan: Discharge home once improved    Brendia Sacksaniel Legacy Carrender, MD  [facility] Direct contact: [phone] --Via amion app OR  --[email or web address]; password [email or web address]  7PM-7AM contact night coverage as above 06/13/2016, 4:51 AM  LOS: 1 day   Consultants:  Cardiology   Procedures:  None   Antimicrobials:  None   HPI/Subjective: Feels better. Improvement in breathing. Denies pain and  N/V  Objective: Vitals:   06/13/16 0100 06/13/16 0200 06/13/16 0300 06/13/16 0400  BP: (!) 149/63 (!) 153/82 (!) 138/55   Pulse: 64 65 65   Resp: 14 14 13    Temp:    97.2 F (36.2 C)  TempSrc:    Oral  SpO2: 93% 93% 92%   Weight:      Height:        Intake/Output Summary (Last 24 hours) at  06/13/16 0451 Last data filed at 06/12/16 1845  Gross per 24 hour  Intake              480 ml  Output              350 ml  Net              130 ml     Filed Weights   06/12/16 1220 06/12/16 1611  Weight: 54 kg (119 lb) 56.4 kg (124 lb 5.4 oz)    Exam:    Constitutional:  . Appears calm and comfortable Respiratory:  . CTA bilaterally, no w/r/r.  . Respiratory effort normal. No retractions or accessory muscle use Cardiovascular:  . RRR, no m/r/g . No LE extremity edema   . Telemetry sinus rhythm Musculoskeletal:  o Moves all extremities  Psychiatric:  . judgement and insight appear normal . Mental status o Mood, affect appropriate   I have personally reviewed following labs and imaging studies:  Hgb 9.6, Stable.  Platelets 144, stable.  Troponins flat, 0.03  BUN 68, Cr 2.28 trending up   Scheduled Meds: . allopurinol  100 mg Oral QHS  . atorvastatin  20 mg Oral q1800  . furosemide  40 mg Intravenous Q12H  . hydrALAZINE  50 mg Oral Q6H  . pantoprazole  40 mg Oral Daily  . sodium chloride flush  3 mL Intravenous Q12H   Continuous Infusions:   Principal Problem:   Acute on chronic diastolic CHF (congestive heart failure) (HCC) Active Problems:   Acute  respiratory failure with hypoxia (HCC)   COPD (chronic obstructive pulmonary disease) (HCC)   Acute pulmonary edema (HCC)   LOS: 1 day   Time spent 25 minutes    By signing my name below, I, Cynda Acres, attest that this documentation has been prepared under the direction and in the presence of Brendia Sacks, MD. Electronically signed: Cynda Acres, Scribe 06/13/16 10:46 AM   I personally performed the services described in this documentation. All medical record entries made by the scribe were at my direction. I have reviewed the chart and agree that the record reflects my personal performance and is accurate and complete. Brendia Sacks, MD

## 2016-06-13 NOTE — Care Management Important Message (Signed)
Important Message  Patient Details  Name: Sydney Jacobs MRN: [id number] Date of Birth: [birth year]   Medicare Important Message Given:  Yes    Malcolm MetroChildress, Nehal Witting Demske, RN 06/13/2016, 4:04 PM

## 2016-06-14 ENCOUNTER — Inpatient Hospital Stay (HOSPITAL_COMMUNITY): Payer: Commercial Managed Care - HMO

## 2016-06-14 DIAGNOSIS — J189 Pneumonia, unspecified organism: Secondary | ICD-10-CM | POA: Diagnosis present

## 2016-06-14 DIAGNOSIS — I248 Other forms of acute ischemic heart disease: Secondary | ICD-10-CM | POA: Diagnosis present

## 2016-06-14 LAB — BASIC METABOLIC PANEL
ANION GAP: 14 (ref 5–15)
BUN: 69 mg/dL — ABNORMAL HIGH (ref 6–20)
CALCIUM: 8.3 mg/dL — AB (ref 8.9–10.3)
CHLORIDE: 102 mmol/L (ref 101–111)
CO2: 28 mmol/L (ref 22–32)
CREATININE: 2.15 mg/dL — AB (ref 0.44–1.00)
GFR calc Af Amer: 24 mL/min — ABNORMAL LOW (ref >60)
GFR, EST NON AFRICAN AMERICAN: 21 mL/min — AB (ref >60)
GLUCOSE: 97 mg/dL (ref 65–99)
POTASSIUM: 3.3 mmol/L — AB (ref 3.5–5.1)
Sodium: 144 mmol/L (ref 135–145)

## 2016-06-14 LAB — TSH: TSH: 1.156 u[IU]/mL (ref 0.350–4.500)

## 2016-06-14 LAB — MAGNESIUM: Magnesium: 1.8 mg/dL (ref 1.7–2.4)

## 2016-06-14 MED ORDER — IPRATROPIUM BROMIDE 0.02 % IN SOLN
0.5000 mg | Freq: Four times a day (QID) | RESPIRATORY_TRACT | Status: DC
  Administered 2016-06-14 (×2): 0.5 mg via RESPIRATORY_TRACT
  Filled 2016-06-14 (×2): qty 2.5

## 2016-06-14 MED ORDER — AZITHROMYCIN 250 MG PO TABS
250.0000 mg | ORAL_TABLET | Freq: Every day | ORAL | Status: DC
  Administered 2016-06-15 – 2016-06-17 (×3): 250 mg via ORAL
  Filled 2016-06-14 (×3): qty 1

## 2016-06-14 MED ORDER — POTASSIUM CHLORIDE CRYS ER 20 MEQ PO TBCR
40.0000 meq | EXTENDED_RELEASE_TABLET | Freq: Once | ORAL | Status: AC
  Administered 2016-06-14: 40 meq via ORAL
  Filled 2016-06-14: qty 2

## 2016-06-14 MED ORDER — VANCOMYCIN HCL IN DEXTROSE 750-5 MG/150ML-% IV SOLN
750.0000 mg | INTRAVENOUS | Status: DC
  Administered 2016-06-16: 750 mg via INTRAVENOUS
  Filled 2016-06-14 (×3): qty 150

## 2016-06-14 MED ORDER — METOPROLOL TARTRATE 50 MG PO TABS
50.0000 mg | ORAL_TABLET | Freq: Two times a day (BID) | ORAL | Status: DC
  Administered 2016-06-14 – 2016-06-17 (×7): 50 mg via ORAL
  Filled 2016-06-14 (×7): qty 1

## 2016-06-14 MED ORDER — AZITHROMYCIN 250 MG PO TABS
500.0000 mg | ORAL_TABLET | Freq: Every day | ORAL | Status: AC
  Administered 2016-06-14: 500 mg via ORAL
  Filled 2016-06-14: qty 2

## 2016-06-14 MED ORDER — VANCOMYCIN HCL IN DEXTROSE 1-5 GM/200ML-% IV SOLN
1000.0000 mg | Freq: Once | INTRAVENOUS | Status: AC
  Administered 2016-06-14: 1000 mg via INTRAVENOUS
  Filled 2016-06-14: qty 200

## 2016-06-14 MED ORDER — ZOLPIDEM TARTRATE 5 MG PO TABS
5.0000 mg | ORAL_TABLET | Freq: Once | ORAL | Status: AC
  Administered 2016-06-14: 5 mg via ORAL
  Filled 2016-06-14: qty 1

## 2016-06-14 MED ORDER — DEXTROSE 5 % IV SOLN
1.0000 g | INTRAVENOUS | Status: DC
  Administered 2016-06-14 – 2016-06-16 (×3): 1 g via INTRAVENOUS
  Filled 2016-06-14 (×7): qty 1

## 2016-06-14 MED ORDER — IPRATROPIUM BROMIDE 0.02 % IN SOLN
0.5000 mg | Freq: Three times a day (TID) | RESPIRATORY_TRACT | Status: DC
  Administered 2016-06-15 – 2016-06-17 (×8): 0.5 mg via RESPIRATORY_TRACT
  Filled 2016-06-14 (×8): qty 2.5

## 2016-06-14 MED ORDER — METHYLPREDNISOLONE SODIUM SUCC 125 MG IJ SOLR
60.0000 mg | INTRAMUSCULAR | Status: DC
  Administered 2016-06-14: 60 mg via INTRAVENOUS
  Filled 2016-06-14: qty 2

## 2016-06-14 NOTE — Progress Notes (Signed)
Pt's bp 180/85, scheduled BP medication administered. BP reassessed, 185/64.

## 2016-06-14 NOTE — Progress Notes (Signed)
PROGRESS NOTE  Sydney Jacobs AVW:[id number] DOB: [birth year] DOA: 06/12/2016 PCP: Colette RibasGOLDING, JOHN CABOT, MD  Brief Narrative: [age] yof presented with complaints of shortness of breath. While in the ED she was found to be hypoxic. Rest x-ray suggested pulmonary edema. Admitted for treatment of acute on chronic diastolic heart failure and acute on chronic hypoxia. Initially responded very well to diuretics with rapid improvement. Subsequently developed increasing shortness of breath, repeat chest x-ray demonstrated new left pleural effusion, infiltrate left greater than right concerning for infection. Started on broad-spectrum antibiotics.  Assessment/Plan: 1. Acute on chronichypoxic respiratory failure secondary to acute CHF and underlying COPD. appears stable on 4 L. 2. HCAP, left pleural effusion. 3. Acute on chronic diastolicCHF. Labile, initially improved, now has more edema on x-ray. LVEF of 60-65%. Grade 2 diastolic dysfunction.   4. Atrial fibrillation. Seems to have converted back to sinus rhythm. Not a candidate for anticoagulation at this point given recent retroperitoneal hematoma. 5. Demand ischemia secondary to acute CHF. No evidence of ACS. 6. Oxygen dependent COPD. Good air movement. No wheezing. Appears stable. 7. Chronic kidney disease stage IV, stable. 8. Retroperitoneal hematoma 06/10/2016, hemoglobin stable. No anticoagulation. 9. Abdominal aortic aneurysm, follow-up with vascular surgery as an outpatient 10. Pulmonary nodule concerning for malignancy. Follow-up as an outpatient.   Appears worse today. Tachypneic. Repeat chest x-ray demonstrates new infiltrate and effusion.   Start on broad-spectrum antibiotics, add steroids. Continue bronchodilators. Effusion appears to be small. Monitor clinically at this point.  Follow-up TSH.  Lasix per cardiology  Prognosis appears guarded. Third hospitalization in approximately 1 month. iscussed with husband, daughter-in-law at  bedside. Plan palliative medicine consultation persistence with goasl of care. Patient full code.  DVT prophylaxis: Lovenox Code Status: Full Family Communication: Family bedside  Disposition Plan: Discharge home once improved   Sydney Sacksaniel Cortasia Screws, MD  [facility] Direct contact: [phone] --Via amion app OR  --[email or web address]; password [email or web address]  7PM-7AM contact night coverage as above 06/14/2016, 2:26 PM  LOS: 2 days   Consultants:  Cardiology  Procedures:  Echo  Left ventricle: The cavity size was normal. Wall thickness was   increased in a pattern of mild LVH. Systolic function was normal.   The estimated ejection fraction was in the range of 60% to 65%.   Wall motion was normal; there were no regional wall motion   abnormalities. Features are consistent with a pseudonormal left   ventricular filling pattern, with concomitant abnormal relaxation   and increased filling pressure (grade 2 diastolic dysfunction).   Doppler parameters are consistent with high ventricular filling   pressure. - Aortic valve: Mildly calcified annulus. Trileaflet; mildly   thickened leaflets. Valve area (VTI): 2.47 cm^2. Valve area   (Vmax): 2.42 cm^2. Valve area (Vmean): 2.36 cm^2. - Mitral valve: Mildly to moderately calcified annulus. Mildly   thickened leaflets . There was mild to moderate regurgitation. - Left atrium: The atrium was moderately dilated. - Right atrium: The atrium was moderately dilated. - Pericardium, extracardiac: There is a large left pleural   effusion. - Technically adequate study.   Antimicrobials:  None   HPI/Subjective: Feels worse. Has not eaten or slept. Shortness of breath has recurred. Denies any pain, nausea, and vomiting. SOB began last night on exertion per family. Per family prednisone seems to have helped in the past.  Objective: Vitals:   06/14/16 0805 06/14/16 0921 06/14/16 1300 06/14/16 1418  BP:  (!) 179/56 (!) 166/72   Pulse:  88 83   Resp:  20   Temp:   98 F (36.7 C)   TempSrc:   Oral   SpO2:   95% 93%  Weight: 52.8 kg (116 lb 6.4 oz)     Height:        Intake/Output Summary (Last 24 hours) at 06/14/16 1426 Last data filed at 06/14/16 1317  Gross per 24 hour  Intake              240 ml  Output             1000 ml  Net             -760 ml     Filed Weights   06/12/16 1611 06/13/16 0500 06/14/16 0805  Weight: 56.4 kg (124 lb 5.4 oz) 54.2 kg (119 lb 7.8 oz) 52.8 kg (116 lb 6.4 oz)    Exam:    Constitutional:  . Appears Ill but nontoxic. Fatigued. ENMT:  . grossly normal hearing  Respiratory:  . CTA bilaterally, no w/r/r.  . Mild crackles posteriorly. No rhonchi. No wheezing. Mild increased respiratory effort. Breathing somewhat labored. Can speak in full sentences Cardiovascular:  . Irregular, no m/r/g . No LE extremity edema   . Telemetry afib with brief bursts of rapid heart rate. Abdomen:  . Soft, nontender, nondistended Musculoskeletal:   Moves all extremities to command Psychiatric:  . judgement and insight appear normal . Would not affect appear grossly normal  I have personally reviewed following labs and imaging studies:  BUN 68, Cr 2.15, stable.  Potassium 3.3 , magnesium 1.8 EKG sinus rhythm  Echocardiogram noted  Chest x-ray independently reviewed, increasing bilateral opacities at the bases, left pleural effusion is new, suspect left infiltrate at the base.    Scheduled Meds: . allopurinol  100 mg Oral QHS  . atorvastatin  20 mg Oral q1800  . azithromycin  500 mg Oral Daily   Followed by  . [START ON 06/15/2016] azithromycin  250 mg Oral Daily  . ceFEPime (MAXIPIME) IV  1 g Intravenous Q8H  . furosemide  40 mg Intravenous Daily  . hydrALAZINE  50 mg Oral Q6H  . ipratropium  0.5 mg Nebulization Q6H  . levalbuterol  0.63 mg Nebulization TID  . methylPREDNISolone (SOLU-MEDROL) injection  60 mg Intravenous Q24H  . metoprolol tartrate  50 mg Oral BID  . pantoprazole  40 mg Oral  Daily  . sodium chloride flush  3 mL Intravenous Q12H   Continuous Infusions:   Principal Problem:   Acute on chronic diastolic CHF (congestive heart failure) (HCC) Active Problems:   Acute respiratory failure with hypoxia (HCC)   COPD (chronic obstructive pulmonary disease) (HCC)   Acute pulmonary edema (HCC)   HCAP (healthcare-associated pneumonia)   Demand ischemia (HCC)   LOS: 2 days   Time spent 35 minutes,greater than 50% in counseling and coordination of care   By signing my name below, I, Cynda Acres, attest that this documentation has been prepared under the direction and in the presence of Sydney Sacks, MD. Electronically signed: Cynda Acres, Scribe 06/14/16 11:58AM   I personally performed the services described in this documentation. All medical record entries made by the scribe were at my direction. I have reviewed the chart and agree that the record reflects my personal performance and is accurate and complete. Sydney Sacks, MD

## 2016-06-14 NOTE — Progress Notes (Addendum)
Subjective:    +SOB overnight and this AM  Objective:   Temp:  [97.6 F (36.4 C)-98.7 F (37.1 C)] 97.6 F (36.4 C) (08/08 0612) Pulse Rate:  [88-113] 88 (08/08 0921) Resp:  [16-21] 20 (08/08 0612) BP: (135-185)/(54-85) 179/56 (08/08 0921) SpO2:  [88 %-97 %] 97 % (08/08 0753) Weight:  [116 lb 6.4 oz (52.8 kg)] 116 lb 6.4 oz (52.8 kg) (08/08 0805) Last BM Date: 06/11/16  Filed Weights   06/12/16 1611 06/13/16 0500 06/14/16 0805  Weight: 124 lb 5.4 oz (56.4 kg) 119 lb 7.8 oz (54.2 kg) 116 lb 6.4 oz (52.8 kg)    Intake/Output Summary (Last 24 hours) at 06/14/16 1011 Last data filed at 06/14/16 [id number]  Gross per 24 hour  Intake              120 ml  Output              350 ml  Net             -230 ml    Telemetry:afib Exam:  General: NAD  HEENT: sclera clear, throat clear  Resp: mild crackles bilateral bases  Cardiac: irreg, no m/r/g, JVD just below angle of jaw  GI: abdomen soft, NT, ND  MSK: no LE edema  Neuro: no focal deficits  Psych: appopriate affect  Lab Results:  Basic Metabolic Panel:  Recent Labs Lab 06/12/16 1237 06/13/16 0540 06/14/16 0559  NA 139 141 144  K 3.5 3.6 3.3*  CL 102 102 102  CO2 29 29 28   GLUCOSE 121* 177* 97  BUN 63* 69* 69*  CREATININE 2.00* 2.28* 2.15*  CALCIUM 8.2* 7.9* 8.3*    Liver Function Tests:  Recent Labs Lab 06/12/16 1237  AST 19  ALT 22  ALKPHOS 55  BILITOT 1.2  PROT 6.0*  ALBUMIN 3.4*    CBC:  Recent Labs Lab 06/10/16 1412 06/12/16 1237 06/13/16 0540  WBC 13.4* 11.4* 7.0  HGB 10.6* 10.2* 9.6*  HCT 32.7* 30.9* 28.8*  MCV 89.1 88.5 87.8  PLT 204 138* 144*    Cardiac Enzymes:  Recent Labs Lab 06/12/16 1742 06/13/16 0104 06/13/16 0540  TROPONINI 0.04* 0.03* 0.03*    BNP: No results for input(s): PROBNP in the last 8760 hours.  Coagulation: No results for input(s): INR in the last 168 hours.  ECG:   Medications:   Scheduled Medications: . allopurinol  100 mg Oral QHS    . atorvastatin  20 mg Oral q1800  . furosemide  40 mg Intravenous Daily  . hydrALAZINE  50 mg Oral Q6H  . levalbuterol  0.63 mg Nebulization TID  . pantoprazole  40 mg Oral Daily  . sodium chloride flush  3 mL Intravenous Q12H     Infusions:     PRN Medications:  sodium chloride, acetaminophen, ondansetron (ZOFRAN) IV, sodium chloride flush     Assessment/Plan    1. Acute on chronic diastolic HF - up 2 lbs since previous discharge according to charting, CXR with pulmonary edema and elevated BNP - overall diruesis has been limited by her renal dysfunction - I/Os are incomplete this admission, weight down 3 lbs from yesterday to 116 lbs. She is on lasix 40mg  IV daily, uptrend in Cr and BUN. Fairly labile baseline around 1.6-2.0 - repeat echo shows LVEF 60-65%, grade II diastolic dysfunction - we will continue IV lasix today, anticipate changing to oral tomorrow.   2. Elevated troponin - demand ischemia in setting of CHF,  recent cath with nonobstructive CAD - no further workup at this time.   3. COPD - managememt per primary team  4. Retroperitoneal bleed - s/p recent cath, managed conservatively. H&H remains stable fairly stable.   5. HTN - elevated bp's, wide pulse pressure from documentation.  - we will restart her home lopressor  6. Afib - appears to be new diagnosis. We will restart her lopressor - please keep K at 4 and Mg at 2. Will add TSH to AM labs.  - with recent retroperitoneal bleed would not start anticoag at this time.   At time of discharge she is to f/u with Dr Elease Hashimoto   Dina Rich, M.D.

## 2016-06-14 NOTE — Progress Notes (Signed)
Pharmacy Antibiotic Note  Sydney Jacobs is a 82 y.o. female admitted on 06/12/2016 with pneumonia.  Pharmacy has been consulted for vancomycin and renal dose adjustment of antibiotics. MRSA PNA unlikely with negative pcr.  Plan: Vancomycin 1 gm IV X 1 then 750 mg IV q48 hours Cefepime 1 gm IV q24 hours F/u renal function, cultures and clinical course  Height: 4\' 10"  (147.3 cm) Weight: 116 lb 6.4 oz (52.8 kg) IBW/kg (Calculated) : 40.9  Temp (24hrs), Avg:98.1 F (36.7 C), Min:97.6 F (36.4 C), Max:98.7 F (37.1 C)   Recent Labs Lab 06/10/16 1412 06/12/16 1237 06/13/16 0540 06/14/16 0559  WBC 13.4* 11.4* 7.0  --   CREATININE 1.91* 2.00* 2.28* 2.15*    Estimated Creatinine Clearance: 15.3 mL/min (by C-G formula based on SCr of 2.15 mg/dL).    Allergies  Allergen Reactions  . Sulfa Antibiotics Other (See Comments)    unknown    Antimicrobials this admission: vanc 8/8 >> plan 8 days therapy cefepime 8/8 >> plan 8 days therapy Azithromycin  8/8>>plan 5 days therapy  8/6  MRSA pcr (-)   Thank you for allowing pharmacy to be a part of this patient's care.  Talbert CageSeay, Sydney Jacobs 06/14/2016 2:36 PM

## 2016-06-14 NOTE — Consult Note (Signed)
   [facility] CM Inpatient Consult   06/14/2016  Sydney Jacobs 30-Jul-1934 [id number]   Met with patient at bedside, who gave me permission to speak with daughter in law, [place]. Discussed [facility] program services and gave a brochure about program services. Patient has had 3 admissions in the past three months, has a history of HF and COPD. Daughter in law states that patient just started with home care services and then ended up back at the hospital. She accepted a brochure about [facility] program services and will discuss them with patient and family and will contact program for future needs. Of note, [facility] Care Management services does not replace or interfere with any services that are arranged by inpatient case management or social work.  For additional questions or referrals please contact: Royetta Crochet. Laymond Purser, RN, BSN, [facility] Liaison [phone]

## 2016-06-15 LAB — BASIC METABOLIC PANEL
ANION GAP: 8 (ref 5–15)
BUN: 64 mg/dL — ABNORMAL HIGH (ref 6–20)
CO2: 29 mmol/L (ref 22–32)
Calcium: 8.1 mg/dL — ABNORMAL LOW (ref 8.9–10.3)
Chloride: 104 mmol/L (ref 101–111)
Creatinine, Ser: 2.02 mg/dL — ABNORMAL HIGH (ref 0.44–1.00)
GFR calc Af Amer: 26 mL/min — ABNORMAL LOW (ref >60)
GFR, EST NON AFRICAN AMERICAN: 22 mL/min — AB (ref >60)
Glucose, Bld: 137 mg/dL — ABNORMAL HIGH (ref 65–99)
POTASSIUM: 4.1 mmol/L (ref 3.5–5.1)
Sodium: 141 mmol/L (ref 135–145)

## 2016-06-15 LAB — CBC
HEMATOCRIT: 28.5 % — AB (ref 36.0–46.0)
HEMOGLOBIN: 9.4 g/dL — AB (ref 12.0–15.0)
MCH: 29.1 pg (ref 26.0–34.0)
MCHC: 33 g/dL (ref 30.0–36.0)
MCV: 88.2 fL (ref 78.0–100.0)
Platelets: 152 10*3/uL (ref 150–400)
RBC: 3.23 MIL/uL — ABNORMAL LOW (ref 3.87–5.11)
RDW: 17.5 % — AB (ref 11.5–15.5)
WBC: 7.7 10*3/uL (ref 4.0–10.5)

## 2016-06-15 LAB — STREP PNEUMONIAE URINARY ANTIGEN: STREP PNEUMO URINARY ANTIGEN: NEGATIVE

## 2016-06-15 MED ORDER — ZOLPIDEM TARTRATE 5 MG PO TABS
5.0000 mg | ORAL_TABLET | Freq: Every evening | ORAL | Status: DC | PRN
  Administered 2016-06-15: 5 mg via ORAL
  Filled 2016-06-15: qty 1

## 2016-06-15 MED ORDER — METHYLPREDNISOLONE SODIUM SUCC 40 MG IJ SOLR
40.0000 mg | Freq: Two times a day (BID) | INTRAMUSCULAR | Status: DC
  Administered 2016-06-15 – 2016-06-17 (×5): 40 mg via INTRAVENOUS
  Filled 2016-06-15 (×5): qty 1

## 2016-06-15 MED ORDER — AMLODIPINE BESYLATE 5 MG PO TABS
10.0000 mg | ORAL_TABLET | Freq: Every day | ORAL | Status: DC
  Administered 2016-06-15 – 2016-06-17 (×3): 10 mg via ORAL
  Filled 2016-06-15 (×3): qty 2

## 2016-06-15 MED ORDER — FUROSEMIDE 40 MG PO TABS
40.0000 mg | ORAL_TABLET | Freq: Every day | ORAL | Status: DC
  Administered 2016-06-15 – 2016-06-17 (×3): 40 mg via ORAL
  Filled 2016-06-15 (×3): qty 1

## 2016-06-15 NOTE — Consult Note (Addendum)
Consult requested by: [facility] Consult requested for respiratory failure/pneumonia:  HPI: This is a 82 year old who has multiple medical problems including COPD a pulmonary nodule hypertension acute on chronic diastolic heart failure and chronic kidney disease that looks like it's probably stage IV at this point. She came back to the hospital with shortness of breath. She's been treated for heart failure and was started on treatment for pneumonia. I think that's appropriate and she does seem to be better. She's coughing a little bit but not very much. Her blood pressure has been up somewhat.  Past Medical History:  Diagnosis Date  . Chronic diastolic CHF (congestive heart failure) (HCC)   . CKD (chronic kidney disease) stage 3, GFR 30-59 ml/min   . COPD (chronic obstructive pulmonary disease) (HCC)   . Essential hypertension   . History of stroke   . Hyperlipidemia   . Pulmonary nodule    Left upper lobe 1 cm pulmonary nodule - chest CT July 2017     Family History  Problem Relation Age of Onset  . Hypertension Mother   . Hypertension Father      Social History   Social History  . Marital status: Married    Spouse name: N/A  . Number of children: N/A  . Years of education: N/A   Social History Main Topics  . Smoking status: Former Smoker    Types: Cigarettes  . Smokeless tobacco: Never Used  . Alcohol use No  . Drug use: No  . Sexual activity: No   Other Topics Concern  . None   Social History Narrative  . None     ROS: She's had cough and congestion. She is short of breath at rest. She's not had any chest pain. She's not had any hemoptysis. Otherwise per the history and physical which I have reviewed    Objective: Vital signs in last 24 hours: Temp:  [98 F (36.7 C)-98.4 F (36.9 C)] 98.3 F (36.8 C) (08/09 0500) Pulse Rate:  [83-91] 85 (08/09 0500) Resp:  [18-20] 18 (08/09 0500) BP: (153-179)/(56-72) 156/65 (08/09 0500) SpO2:  [93 %-95 %] 95 %  (08/09 0729) Weight:  [51.7 kg (113 lb 15.7 oz)] 51.7 kg (113 lb 15.7 oz) (08/09 0500) Weight change:  Last BM Date: 06/13/16  Intake/Output from previous day: 08/08 0701 - 08/09 0700 In: 360 [P.O.:360] Out: 1525 [Urine:1525]  PHYSICAL EXAM She is awake and alert and in mild distress. She is wearing nasal oxygen. Her respiratory rates in the 20s. Her pupils are reactive. Nose and throat are clear. Her neck is supple without masses. Her chest shows rales and rhonchi bilaterally. Her heart is regular I don't hear a gallop. Her abdomen soft without masses. Central nervous system exam grossly intact  Lab Results: Basic Metabolic Panel:  Recent Labs  16/10/96 0559 06/14/16 1130 06/15/16 0605  NA 144  --  141  K 3.3*  --  4.1  CL 102  --  104  CO2 28  --  29  GLUCOSE 97  --  137*  BUN 69*  --  64*  CREATININE 2.15*  --  2.02*  CALCIUM 8.3*  --  8.1*  MG  --  1.8  --    Liver Function Tests:  Recent Labs  06/12/16 1237  AST 19  ALT 22  ALKPHOS 55  BILITOT 1.2  PROT 6.0*  ALBUMIN 3.4*   No results for input(s): LIPASE, AMYLASE in the last 72 hours. No results for input(s):  AMMONIA in the last 72 hours. CBC:  Recent Labs  06/12/16 1237 06/13/16 0540 06/15/16 0605  WBC 11.4* 7.0 7.7  NEUTROABS 9.7*  --   --   HGB 10.2* 9.6* 9.4*  HCT 30.9* 28.8* 28.5*  MCV 88.5 87.8 88.2  PLT 138* 144* 152   Cardiac Enzymes:  Recent Labs  06/12/16 1742 06/13/16 0104 06/13/16 0540  TROPONINI 0.04* 0.03* 0.03*   BNP: No results for input(s): PROBNP in the last 72 hours. D-Dimer: No results for input(s): DDIMER in the last 72 hours. CBG: No results for input(s): GLUCAP in the last 72 hours. Hemoglobin A1C: No results for input(s): HGBA1C in the last 72 hours. Fasting Lipid Panel: No results for input(s): CHOL, HDL, LDLCALC, TRIG, CHOLHDL, LDLDIRECT in the last 72 hours. Thyroid Function Tests:  Recent Labs  06/12/16 1230  TSH 1.156   Anemia Panel: No results  for input(s): VITAMINB12, FOLATE, FERRITIN, TIBC, IRON, RETICCTPCT in the last 72 hours. Coagulation: No results for input(s): LABPROT, INR in the last 72 hours. Urine Drug Screen: Drugs of Abuse  No results found for: LABOPIA, COCAINSCRNUR, LABBENZ, AMPHETMU, THCU, LABBARB  Alcohol Level: No results for input(s): ETH in the last 72 hours. Urinalysis: No results for input(s): COLORURINE, LABSPEC, PHURINE, GLUCOSEU, HGBUR, BILIRUBINUR, KETONESUR, PROTEINUR, UROBILINOGEN, NITRITE, LEUKOCYTESUR in the last 72 hours.  Invalid input(s): APPERANCEUR Misc. Labs:   ABGS:  Recent Labs  06/12/16 1255  PHART 7.468*  PO2ART 54.9*  TCO2 12.1  HCO3 29.2*     MICROBIOLOGY: Recent Results (from the past 240 hour(s))  MRSA PCR Screening     Status: None   Collection Time: 06/12/16  4:05 PM  Result Value Ref Range Status   MRSA by PCR NEGATIVE NEGATIVE Final    Comment:        The GeneXpert MRSA Assay (FDA approved for NASAL specimens only), is one component of a comprehensive MRSA colonization surveillance program. It is not intended to diagnose MRSA infection nor to guide or monitor treatment for MRSA infections.     Studies/Results: Dg Chest Port 1 View  Result Date: 06/14/2016 CLINICAL DATA:  Increased shortness of breath. EXAM: PORTABLE CHEST 1 VIEW COMPARISON:  Radiograph of June 12, 2016. FINDINGS: Stable cardiomediastinal silhouette. Atherosclerosis of thoracic aorta is noted. No pneumothorax is noted. Increased interstitial densities are noted throughout both lungs concerning for pulmonary edema or atypical infection, right worse than left. Moderate left pleural effusion is noted with associated atelectasis or infiltrate. Bony thorax is unremarkable. IMPRESSION: Aortic atherosclerosis. Increased diffuse interstitial densities are noted throughout both lungs, right greater than left, concerning for pulmonary edema or atypical infection. Moderate left pleural effusion is noted  with associated atelectasis or infiltrate. Electronically Signed   By: Lupita RaiderJames  Green Jr, M.D.   On: 06/14/2016 14:04    Medications:  Prior to Admission:  Prescriptions Prior to Admission  Medication Sig Dispense Refill Last Dose  . albuterol (PROVENTIL HFA;VENTOLIN HFA) 108 (90 Base) MCG/ACT inhaler Inhale 2 puffs into the lungs every 6 (six) hours as needed for wheezing or shortness of breath.   unknown  . amLODipine (NORVASC) 10 MG tablet Take 1 tablet (10 mg total) by mouth daily. 30 tablet 0 06/12/2016 at Unknown time  . atorvastatin (LIPITOR) 20 MG tablet Take 1 tablet (20 mg total) by mouth daily at 6 PM. 30 tablet 0 06/11/2016 at Unknown time  . furosemide (LASIX) 20 MG tablet Take 1 tablet (20 mg total) by mouth daily. 30 tablet 0  06/12/2016 at Unknown time  . hydrALAZINE (APRESOLINE) 50 MG tablet Take 1 tablet (50 mg total) by mouth every 6 (six) hours. 120 tablet 0 06/12/2016 at Unknown time  . metoprolol (LOPRESSOR) 50 MG tablet Take 1 tablet (50 mg total) by mouth 2 (two) times daily. 60 tablet 0 06/12/2016 at Unknown time  . pantoprazole (PROTONIX) 40 MG tablet Take 1 tablet (40 mg total) by mouth daily. 30 tablet 0 06/12/2016 at Unknown time  . Vitamin D, Ergocalciferol, (DRISDOL) 50000 units CAPS capsule Take 1 capsule by mouth once a week. Takes on Fridays.   Past Week at Unknown time  . cetirizine (ZYRTEC) 10 MG tablet Take 10 mg by mouth daily.   06/12/2016   Scheduled: . allopurinol  100 mg Oral QHS  . atorvastatin  20 mg Oral q1800  . azithromycin  250 mg Oral Daily  . ceFEPime (MAXIPIME) IV  1 g Intravenous Q24H  . furosemide  40 mg Intravenous Daily  . hydrALAZINE  50 mg Oral Q6H  . ipratropium  0.5 mg Nebulization TID  . levalbuterol  0.63 mg Nebulization TID  . methylPREDNISolone (SOLU-MEDROL) injection  40 mg Intravenous Q12H  . metoprolol tartrate  50 mg Oral BID  . pantoprazole  40 mg Oral Daily  . sodium chloride flush  3 mL Intravenous Q12H  . [START ON 06/16/2016]  vancomycin  750 mg Intravenous Q48H   Continuous:  ZOX:WRUEAV chloride, acetaminophen, ondansetron (ZOFRAN) IV, sodium chloride flush  Assesment: She was admitted with acute on chronic diastolic heart failure. She has some pulmonary edema but I agree is reasonable to treat her for healthcare associated pneumonia because she certainly could have pneumonia that we can differentiate from her pulmonary edema particularly considering her past history. She does seem better with steroids and antibiotics so I would continue that. I adjusted the dose for steroids slightly. Her family says it at home when she is on a steroid taper at fairly high dose she does quite well but when she comes off the steroids she develops respiratory failure again. We discussed the possibility of staying on low-dose steroids longer term. They're also concerned about her renal dysfunction and wonder if we should get her involved with a nephrologist. Principal Problem:   Acute on chronic diastolic CHF (congestive heart failure) (HCC) Active Problems:   Acute respiratory failure with hypoxia (HCC)   COPD (chronic obstructive pulmonary disease) (HCC)   Acute pulmonary edema (HCC)   HCAP (healthcare-associated pneumonia)   Demand ischemia (HCC)    Plan: Continue current treatments. Continue IV antibiotics and steroids. I will follow with you. I will see her in my office. She has an appointment tomorrow but we will reschedule that    LOS: 3 days   Merian Wroe L 06/15/2016, 9:03 AM

## 2016-06-15 NOTE — Progress Notes (Signed)
PROGRESS NOTE  Sydney Jacobs ZOX:[id number] DOB: 12/09/1933 DOA: 06/12/2016 PCP: Colette RibasGOLDING, JOHN CABOT, MD  Brief Narrative: [id number] yof presented with complaints of shortness of breath. While in the ED she was found to be hypoxic. Rest x-ray suggested pulmonary edema. Admitted for treatment of acute on chronic diastolic heart failure and acute on chronic hypoxia. Initially responded very well to diuretics with rapid improvement. Subsequently developed increasing shortness of breath, repeat chest x-ray demonstrated new left pleural effusion, infiltrate left greater than right concerning for infection. Started on broad-spectrum antibiotics.  Assessment/Plan: 1. Acute on chronichypoxic respiratory failure secondary to acute CHF and underlying COPD. appears stable on 4 L. May need chronic low dose steroids as she becomes more SOB once steroids are tapered off. Dr. Juanetta GoslingHawkins following. 2. HCAP, left pleural effusion. Continue vanc/cefepime/azithromycin. 3. Acute on chronic diastolicCHF.  LVEF of 60-65%. Grade 2 diastolic dysfunction. Volume status improved, Is and Os not adequately documented. Cardiology on board, Lasix has been switched to PO today. 4. Atrial fibrillation. Seems to have converted back to sinus rhythm. Not a candidate for anticoagulation at this point given recent retroperitoneal hematoma. 5. Demand ischemia secondary to acute CHF. No evidence of ACS. 6. Oxygen dependent COPD. Good air movement. No wheezing. Appears stable. 7. Chronic kidney disease stage IV, stable. 8. Retroperitoneal hematoma 06/10/2016, hemoglobin stable. No anticoagulation. 9. Abdominal aortic aneurysm, follow-up with vascular surgery as an outpatient 10. Pulmonary nodule concerning for malignancy. Follow-up as an outpatient.   Prognosis appears guarded. Third hospitalization in approximately 1 month. Discussed with daughter-in-law at bedside. Plan palliative medicine consultation for GOC. Patient full code.  DVT  prophylaxis: Lovenox Code Status: Full Family Communication: Family bedside  Disposition Plan: Discharge home once improved    Consultants:  Cardiology  Palliative Medicine  Procedures:  Echo  Left ventricle: The cavity size was normal. Wall thickness was   increased in a pattern of mild LVH. Systolic function was normal.   The estimated ejection fraction was in the range of 60% to 65%.   Wall motion was normal; there were no regional wall motion   abnormalities. Features are consistent with a pseudonormal left   ventricular filling pattern, with concomitant abnormal relaxation   and increased filling pressure (grade 2 diastolic dysfunction).   Doppler parameters are consistent with high ventricular filling   pressure. - Aortic valve: Mildly calcified annulus. Trileaflet; mildly   thickened leaflets. Valve area (VTI): 2.47 cm^2. Valve area   (Vmax): 2.42 cm^2. Valve area (Vmean): 2.36 cm^2. - Mitral valve: Mildly to moderately calcified annulus. Mildly   thickened leaflets . There was mild to moderate regurgitation. - Left atrium: The atrium was moderately dilated. - Right atrium: The atrium was moderately dilated. - Pericardium, extracardiac: There is a large left pleural   effusion. - Technically adequate study.   Antimicrobials:  None   HPI/Subjective: Feels improved today.  Objective: Vitals:   06/15/16 0500 06/15/16 0729 06/15/16 1300 06/15/16 1339  BP: (!) 156/65  (!) 156/70   Pulse: 85  78   Resp: 18  18   Temp: 98.3 F (36.8 C)  98.5 F (36.9 C)   TempSrc: Oral  Oral   SpO2: 93% 95% 92% (!) 89%  Weight: 51.7 kg (113 lb 15.7 oz)     Height:        Intake/Output Summary (Last 24 hours) at 06/15/16 1649 Last data filed at 06/15/16 1200  Gross per 24 hour  Intake  480 ml  Output             1175 ml  Net             -695 ml     Filed Weights   06/13/16 0500 06/14/16 0805 06/15/16 0500  Weight: 54.2 kg (119 lb 7.8 oz) 52.8 kg (116 lb  6.4 oz) 51.7 kg (113 lb 15.7 oz)    Exam:    Constitutional:  . AA Ox3. Fatigued. ENMT:  . grossly normal hearing  Respiratory:  . CTA bilaterally, no w/r/r.  . Mild crackles posteriorly. No rhonchi. No wheezing. Mild increased respiratory effort. Breathing somewhat labored. Can speak in full sentences Cardiovascular:  . Irregular, no m/r/g . No LE extremity edema   . Telemetry afib with brief bursts of rapid heart rate. Abdomen:  . Soft, nontender, nondistended Musculoskeletal:   Moves all extremities to command Psychiatric:  . judgement and insight appear normal . Would not affect appear grossly normal  I have personally reviewed following labs and imaging studies:   Scheduled Meds: . allopurinol  100 mg Oral QHS  . amLODipine  10 mg Oral Daily  . atorvastatin  20 mg Oral q1800  . azithromycin  250 mg Oral Daily  . ceFEPime (MAXIPIME) IV  1 g Intravenous Q24H  . furosemide  40 mg Oral Daily  . hydrALAZINE  50 mg Oral Q6H  . ipratropium  0.5 mg Nebulization TID  . levalbuterol  0.63 mg Nebulization TID  . methylPREDNISolone (SOLU-MEDROL) injection  40 mg Intravenous Q12H  . metoprolol tartrate  50 mg Oral BID  . pantoprazole  40 mg Oral Daily  . sodium chloride flush  3 mL Intravenous Q12H  . [START ON 06/16/2016] vancomycin  750 mg Intravenous Q48H   Continuous Infusions:   Principal Problem:   Acute on chronic diastolic CHF (congestive heart failure) (HCC) Active Problems:   Acute respiratory failure with hypoxia (HCC)   COPD (chronic obstructive pulmonary disease) (HCC)   Acute pulmonary edema (HCC)   HCAP (healthcare-associated pneumonia)   Demand ischemia (HCC)   LOS: 3 days   Time spent 25 minutes,greater than 50% in counseling and coordination of care   Peggye Pitt, MD [facility] Pager: [phone]

## 2016-06-15 NOTE — Care Management Important Message (Signed)
Important Message  Patient Details  Name: Sydney Jacobs MRN: [id number] Date of Birth: [birth year]   Medicare Important Message Given:  Yes    Treyvone Chelf, Chrystine OilerSharley Diane, RN 06/15/2016, 2:02 PM

## 2016-06-15 NOTE — Progress Notes (Signed)
Subjective:    SOB much improved this AM  Objective:   Temp:  [98 F (36.7 C)-98.4 F (36.9 C)] 98.3 F (36.8 C) (08/09 0500) Pulse Rate:  [83-91] 85 (08/09 0500) Resp:  [18-20] 18 (08/09 0500) BP: (153-179)/(56-72) 156/65 (08/09 0500) SpO2:  [93 %-95 %] 95 % (08/09 0729) Weight:  [113 lb 15.7 oz (51.7 kg)] 113 lb 15.7 oz (51.7 kg) (08/09 0500) Last BM Date: 06/13/16  Filed Weights   06/13/16 0500 06/14/16 0805 06/15/16 0500  Weight: 119 lb 7.8 oz (54.2 kg) 116 lb 6.4 oz (52.8 kg) 113 lb 15.7 oz (51.7 kg)    Intake/Output Summary (Last 24 hours) at 06/15/16 0910 Last data filed at 06/15/16 0300  Gross per 24 hour  Intake              360 ml  Output             1375 ml  Net            -1015 ml    Telemetry:SR, PACs  Exam:  General: NAD  HEENT: sclera clear, throat clear  Resp: faint crackles bilateral bases  Cardiac: RRR, no m/r/g, mildly elevated JVD.   GI: abdomen soft, NT, ND  MSK: no LE edema  Neuro: no focal deficits  Psych: appropriate affect  Lab Results:  Basic Metabolic Panel:  Recent Labs Lab 06/13/16 0540 06/14/16 0559 06/14/16 1130 06/15/16 0605  NA 141 144  --  141  K 3.6 3.3*  --  4.1  CL 102 102  --  104  CO2 29 28  --  29  GLUCOSE 177* 97  --  137*  BUN 69* 69*  --  64*  CREATININE 2.28* 2.15*  --  2.02*  CALCIUM 7.9* 8.3*  --  8.1*  MG  --   --  1.8  --     Liver Function Tests:  Recent Labs Lab 06/12/16 1237  AST 19  ALT 22  ALKPHOS 55  BILITOT 1.2  PROT 6.0*  ALBUMIN 3.4*    CBC:  Recent Labs Lab 06/12/16 1237 06/13/16 0540 06/15/16 0605  WBC 11.4* 7.0 7.7  HGB 10.2* 9.6* 9.4*  HCT 30.9* 28.8* 28.5*  MCV 88.5 87.8 88.2  PLT 138* 144* 152    Cardiac Enzymes:  Recent Labs Lab 06/12/16 1742 06/13/16 0104 06/13/16 0540  TROPONINI 0.04* 0.03* 0.03*    BNP: No results for input(s): PROBNP in the last 8760 hours.  Coagulation: No results for input(s): INR in the last 168  hours.  ECG:   Medications:   Scheduled Medications: . allopurinol  100 mg Oral QHS  . atorvastatin  20 mg Oral q1800  . azithromycin  250 mg Oral Daily  . ceFEPime (MAXIPIME) IV  1 g Intravenous Q24H  . furosemide  40 mg Intravenous Daily  . hydrALAZINE  50 mg Oral Q6H  . ipratropium  0.5 mg Nebulization TID  . levalbuterol  0.63 mg Nebulization TID  . methylPREDNISolone (SOLU-MEDROL) injection  40 mg Intravenous Q12H  . metoprolol tartrate  50 mg Oral BID  . pantoprazole  40 mg Oral Daily  . sodium chloride flush  3 mL Intravenous Q12H  . [START ON 06/16/2016] vancomycin  750 mg Intravenous Q48H     Infusions:     PRN Medications:  sodium chloride, acetaminophen, ondansetron (ZOFRAN) IV, sodium chloride flush     Assessment/Plan    1. Acute on chronic diastolic HF - up  2 lbs since previous discharge according to charting, CXR with pulmonary edema and elevated BNP - overall diruesis has been limited by her renal dysfunction - I/Os are incomplete this admission, weight down 3 lbs from yesterday to 113 lbs, 4 lbs lighter than her discharge weight 05/2016. She is on lasix  IV daily Fairly labile baseline around 1.6-2.0, renal function stable since yesterday.  - repeat echo shows LVEF 60-65%, grade II diastolic dysfunction - despite diuresis xray appears worst, ? Edema vs atypical infection. Started on abx by primary team.   - we will d/c IV lasix today, start oral lasix  daily.   2. Elevated troponin - demand ischemiain setting of CHF, recent cath with nonobstructive CAD - no further workup at this time.   3. COPD - managememt per primary team  4. Retroperitoneal bleed/Right groin hematoma - s/p recent cath, managed conservatively. H&H remains stable fairly stable.   5. HTN - elevated bp's  - we will restart home norvasc  daily.    6. Afib - appears to be new diagnosis, occurred while beta blocker on hold. Now back on lopressor, back in  NSR - with recent retroperitoneal bleed would not start anticoag at this time.   At time of discharge she is to f/u with Dr Elease Hashimoto.       Dina Rich, M.D.

## 2016-06-16 ENCOUNTER — Encounter (HOSPITAL_COMMUNITY): Payer: Self-pay | Admitting: Primary Care

## 2016-06-16 DIAGNOSIS — Z515 Encounter for palliative care: Secondary | ICD-10-CM

## 2016-06-16 DIAGNOSIS — Z7189 Other specified counseling: Secondary | ICD-10-CM

## 2016-06-16 DIAGNOSIS — I480 Paroxysmal atrial fibrillation: Secondary | ICD-10-CM | POA: Diagnosis not present

## 2016-06-16 LAB — CBC
HCT: 29.3 % — ABNORMAL LOW (ref 36.0–46.0)
HEMOGLOBIN: 9.4 g/dL — AB (ref 12.0–15.0)
MCH: 28.2 pg (ref 26.0–34.0)
MCHC: 32.1 g/dL (ref 30.0–36.0)
MCV: 88 fL (ref 78.0–100.0)
PLATELETS: 150 10*3/uL (ref 150–400)
RBC: 3.33 MIL/uL — AB (ref 3.87–5.11)
RDW: 16.9 % — ABNORMAL HIGH (ref 11.5–15.5)
WBC: 7.6 10*3/uL (ref 4.0–10.5)

## 2016-06-16 LAB — BASIC METABOLIC PANEL
ANION GAP: 9 (ref 5–15)
BUN: 71 mg/dL — ABNORMAL HIGH (ref 6–20)
CHLORIDE: 102 mmol/L (ref 101–111)
CO2: 29 mmol/L (ref 22–32)
CREATININE: 2.11 mg/dL — AB (ref 0.44–1.00)
Calcium: 8.3 mg/dL — ABNORMAL LOW (ref 8.9–10.3)
GFR calc non Af Amer: 21 mL/min — ABNORMAL LOW (ref >60)
GFR, EST AFRICAN AMERICAN: 25 mL/min — AB (ref >60)
Glucose, Bld: 138 mg/dL — ABNORMAL HIGH (ref 65–99)
Potassium: 4.3 mmol/L (ref 3.5–5.1)
SODIUM: 140 mmol/L (ref 135–145)

## 2016-06-16 NOTE — Progress Notes (Signed)
Subjective: She says she feels much better. She looks substantially better  Objective: Vital signs in last 24 hours: Temp:  [97.5 F (36.4 C)-98.5 F (36.9 C)] 97.5 F (36.4 C) (08/09 2300) Pulse Rate:  [72-78] 72 (08/09 2300) Resp:  [15-18] 15 (08/09 2300) BP: (140-156)/(61-70) 140/61 (08/09 2300) SpO2:  [89 %-98 %] 98 % (08/10 0743) Weight change:  Last BM Date: 06/15/16  Intake/Output from previous day: 08/09 0701 - 08/10 0700 In: 600 [P.O.:600] Out: 750 [Urine:750]  PHYSICAL EXAM General appearance: alert, cooperative and mild distress Resp: clear to auscultation bilaterally Cardio: regular rate and rhythm, S1, S2 normal, no murmur, click, rub or gallop GI: soft, non-tender; bowel sounds normal; no masses,  no organomegaly Extremities: extremities normal, atraumatic, no cyanosis or edema  Lab Results:  Results for orders placed or performed during the hospital encounter of 06/12/16 (from the past 48 hour(s))  Magnesium     Status: None   Collection Time: 06/14/16 11:30 AM  Result Value Ref Range   Magnesium 1.8 1.7 - 2.4 mg/dL  Strep pneumoniae urinary antigen     Status: None   Collection Time: 06/14/16  5:00 PM  Result Value Ref Range   Strep Pneumo Urinary Antigen NEGATIVE NEGATIVE    Comment: Performed at [facility]        Infection due to S. pneumoniae cannot be absolutely ruled out since the antigen present may be below the detection limit of the test. Performed at [facility]   Basic metabolic panel     Status: Abnormal   Collection Time: 06/15/16  6:05 AM  Result Value Ref Range   Sodium 141 135 - 145 mmol/L   Potassium 4.1 3.5 - 5.1 mmol/L    Comment: DELTA CHECK NOTED   Chloride 104 101 - 111 mmol/L   CO2 29 22 - 32 mmol/L   Glucose, Bld 137 (H) 65 - 99 mg/dL   BUN 64 (H) 6 - 20 mg/dL   Creatinine, Ser 3.65 (H) 0.44 - 1.00 mg/dL   Calcium 8.1 (L) 8.9 - 10.3 mg/dL   GFR calc non Af Amer 22 (L) >60 mL/min   GFR calc Af Amer 26  (L) >60 mL/min    Comment: (NOTE) The eGFR has been calculated using the CKD EPI equation. This calculation has not been validated in all clinical situations. eGFR's persistently <60 mL/min signify possible Chronic Kidney Disease.    Anion gap 8 5 - 15  CBC     Status: Abnormal   Collection Time: 06/15/16  6:05 AM  Result Value Ref Range   WBC 7.7 4.0 - 10.5 K/uL   RBC 3.23 (L) 3.87 - 5.11 MIL/uL   Hemoglobin 9.4 (L) 12.0 - 15.0 g/dL   HCT 78.4 (L) 29.2 - 77.2 %   MCV 88.2 78.0 - 100.0 fL   MCH 29.1 26.0 - 34.0 pg   MCHC 33.0 30.0 - 36.0 g/dL   RDW 62.4 (H) 20.9 - 11.5 %   Platelets 152 150 - 400 K/uL  Basic metabolic panel     Status: Abnormal   Collection Time: 06/16/16  6:43 AM  Result Value Ref Range   Sodium 140 135 - 145 mmol/L   Potassium 4.3 3.5 - 5.1 mmol/L   Chloride 102 101 - 111 mmol/L   CO2 29 22 - 32 mmol/L   Glucose, Bld 138 (H) 65 - 99 mg/dL   BUN 71 (H) 6 - 20 mg/dL   Creatinine, Ser 3.51 (H)  0.44 - 1.00 mg/dL   Calcium 8.3 (L) 8.9 - 10.3 mg/dL   GFR calc non Af Amer 21 (L) >60 mL/min   GFR calc Af Amer 25 (L) >60 mL/min    Comment: (NOTE) The eGFR has been calculated using the CKD EPI equation. This calculation has not been validated in all clinical situations. eGFR's persistently <60 mL/min signify possible Chronic Kidney Disease.    Anion gap 9 5 - 15  CBC     Status: Abnormal   Collection Time: 06/16/16  6:43 AM  Result Value Ref Range   WBC 7.6 4.0 - 10.5 K/uL   RBC 3.33 (L) 3.87 - 5.11 MIL/uL   Hemoglobin 9.4 (L) 12.0 - 15.0 g/dL   HCT 29.3 (L) 36.0 - 46.0 %   MCV 88.0 78.0 - 100.0 fL   MCH 28.2 26.0 - 34.0 pg   MCHC 32.1 30.0 - 36.0 g/dL   RDW 16.9 (H) 11.5 - 15.5 %   Platelets 150 150 - 400 K/uL    ABGS No results for input(s): PHART, PO2ART, TCO2, HCO3 in the last 72 hours.  Invalid input(s): PCO2 CULTURES Recent Results (from the past 240 hour(s))  MRSA PCR Screening     Status: None   Collection Time: 06/12/16  4:05 PM  Result  Value Ref Range Status   MRSA by PCR NEGATIVE NEGATIVE Final    Comment:        The GeneXpert MRSA Assay (FDA approved for NASAL specimens only), is one component of a comprehensive MRSA colonization surveillance program. It is not intended to diagnose MRSA infection nor to guide or monitor treatment for MRSA infections.    Studies/Results: Dg Chest Port 1 View  Result Date: 06/14/2016 CLINICAL DATA:  Increased shortness of breath. EXAM: PORTABLE CHEST 1 VIEW COMPARISON:  Radiograph of June 12, 2016. FINDINGS: Stable cardiomediastinal silhouette. Atherosclerosis of thoracic aorta is noted. No pneumothorax is noted. Increased interstitial densities are noted throughout both lungs concerning for pulmonary edema or atypical infection, right worse than left. Moderate left pleural effusion is noted with associated atelectasis or infiltrate. Bony thorax is unremarkable. IMPRESSION: Aortic atherosclerosis. Increased diffuse interstitial densities are noted throughout both lungs, right greater than left, concerning for pulmonary edema or atypical infection. Moderate left pleural effusion is noted with associated atelectasis or infiltrate. Electronically Signed   By: Marijo Conception, M.D.   On: 06/14/2016 14:04    Medications:  Prior to Admission:  Prescriptions Prior to Admission  Medication Sig Dispense Refill Last Dose  . albuterol (PROVENTIL HFA;VENTOLIN HFA) 108 (90 Base) MCG/ACT inhaler Inhale 2 puffs into the lungs every 6 (six) hours as needed for wheezing or shortness of breath.   unknown  . amLODipine (NORVASC) 10 MG tablet Take 1 tablet (10 mg total) by mouth daily. 30 tablet 0 06/12/2016 at Unknown time  . atorvastatin (LIPITOR) 20 MG tablet Take 1 tablet (20 mg total) by mouth daily at 6 PM. 30 tablet 0 06/11/2016 at Unknown time  . furosemide (LASIX) 20 MG tablet Take 1 tablet (20 mg total) by mouth daily. 30 tablet 0 06/12/2016 at Unknown time  . hydrALAZINE (APRESOLINE) 50 MG tablet Take  1 tablet (50 mg total) by mouth every 6 (six) hours. 120 tablet 0 06/12/2016 at Unknown time  . metoprolol (LOPRESSOR) 50 MG tablet Take 1 tablet (50 mg total) by mouth 2 (two) times daily. 60 tablet 0 06/12/2016 at Unknown time  . pantoprazole (PROTONIX) 40 MG tablet Take 1 tablet (  40 mg total) by mouth daily. 30 tablet 0 06/12/2016 at Unknown time  . Vitamin D, Ergocalciferol, (DRISDOL) 50000 units CAPS capsule Take 1 capsule by mouth once a week. Takes on Fridays.   Past Week at Unknown time  . cetirizine (ZYRTEC) 10 MG tablet Take 10 mg by mouth daily.   06/12/2016   Scheduled: . allopurinol  100 mg Oral QHS  . amLODipine  10 mg Oral Daily  . atorvastatin  20 mg Oral q1800  . azithromycin  250 mg Oral Daily  . ceFEPime (MAXIPIME) IV  1 g Intravenous Q24H  . furosemide  40 mg Oral Daily  . hydrALAZINE  50 mg Oral Q6H  . ipratropium  0.5 mg Nebulization TID  . levalbuterol  0.63 mg Nebulization TID  . methylPREDNISolone (SOLU-MEDROL) injection  40 mg Intravenous Q12H  . metoprolol tartrate  50 mg Oral BID  . pantoprazole  40 mg Oral Daily  . sodium chloride flush  3 mL Intravenous Q12H  . vancomycin  750 mg Intravenous Q48H   Continuous:  XMD:YJWLKH chloride, acetaminophen, ondansetron (ZOFRAN) IV, sodium chloride flush, zolpidem  Assesment: She was admitted with acute on chronic hypoxic respiratory failure and this is likely multifactorial including acute on chronic diastolic heart failure and healthcare associated pneumonia. She is better. Principal Problem:   Acute on chronic diastolic CHF (congestive heart failure) (HCC) Active Problems:   Acute respiratory failure with hypoxia (HCC)   COPD (chronic obstructive pulmonary disease) (HCC)   Acute pulmonary edema (HCC)   HCAP (healthcare-associated pneumonia)   Demand ischemia ([place])    Plan: Continue treatments.    LOS: 4 days   Lavonia Eager L 06/16/2016, 8:58 AM

## 2016-06-16 NOTE — Progress Notes (Signed)
Subjective:  Breathing better  Objective:  Vital Signs in the last 24 hours: Temp:  [97.5 F (36.4 C)-98.5 F (36.9 C)] 97.5 F (36.4 C) (08/09 2300) Pulse Rate:  [72-78] 72 (08/09 2300) Resp:  [15-18] 15 (08/09 2300) BP: (140-156)/(61-70) 140/61 (08/09 2300) SpO2:  [89 %-98 %] 98 % (08/10 0743)  Intake/Output from previous day:  Intake/Output Summary (Last 24 hours) at 06/16/16 0916 Last data filed at 06/16/16 0600  Gross per 24 hour  Intake              480 ml  Output              450 ml  Net               30 ml    Physical Exam: General appearance: alert, cooperative, no distress and frail, on O2 Neck: no JVD Lungs: decreased breath sounds, no wheezing Heart: regular rate and rhythm Extremities: no edema Neurologic: Grossly normal   Rate: 76  Rhythm: normal sinus rhythm  Lab Results:  Recent Labs  06/15/16 0605 06/16/16 0643  WBC 7.7 7.6  HGB 9.4* 9.4*  PLT 152 150    Recent Labs  06/15/16 0605 06/16/16 0643  NA 141 140  K 4.1 4.3  CL 104 102  CO2 29 29  GLUCOSE 137* 138*  BUN 64* 71*  CREATININE 2.02* 2.11*   No results for input(s): TROPONINI in the last 72 hours.  Invalid input(s): CK, MB No results for input(s): INR in the last 72 hours.  Scheduled Meds: . allopurinol  100 mg Oral QHS  . amLODipine  10 mg Oral Daily  . atorvastatin  20 mg Oral q1800  . azithromycin  250 mg Oral Daily  . ceFEPime (MAXIPIME) IV  1 g Intravenous Q24H  . furosemide  40 mg Oral Daily  . hydrALAZINE  50 mg Oral Q6H  . ipratropium  0.5 mg Nebulization TID  . levalbuterol  0.63 mg Nebulization TID  . methylPREDNISolone (SOLU-MEDROL) injection  40 mg Intravenous Q12H  . metoprolol tartrate  50 mg Oral BID  . pantoprazole  40 mg Oral Daily  . sodium chloride flush  3 mL Intravenous Q12H  . vancomycin  750 mg Intravenous Q48H   Continuous Infusions:  PRN Meds:.sodium chloride, acetaminophen, ondansetron (ZOFRAN) IV, sodium chloride flush,  zolpidem   Imaging: Imaging results have been reviewed  Cardiac Studies: Echo 06/13/16 Study Conclusions  - Left ventricle: The cavity size was normal. Wall thickness was   increased in a pattern of mild LVH. Systolic function was normal.   The estimated ejection fraction was in the range of 60% to 65%.   Wall motion was normal; there were no regional wall motion   abnormalities. Features are consistent with a pseudonormal left   ventricular filling pattern, with concomitant abnormal relaxation   and increased filling pressure (grade 2 diastolic dysfunction).   Doppler parameters are consistent with high ventricular filling   pressure. - Aortic valve: Mildly calcified annulus. Trileaflet; mildly   thickened leaflets. Valve area (VTI): 2.47 cm^2. Valve area   (Vmax): 2.42 cm^2. Valve area (Vmean): 2.36 cm^2. - Mitral valve: Mildly to moderately calcified annulus. Mildly   thickened leaflets . There was mild to moderate regurgitation. - Left atrium: The atrium was moderately dilated. - Right atrium: The atrium was moderately dilated. - Pericardium, extracardiac: There is a large left pleural   effusion. - Technically adequate study.   Assessment/Plan:  82 y/o  female admitted earlier in July 2017with hypertensive urgency and COPD exacerbation, She was admitted again 05/24/16 with respiratory failure with a slightly elevated Troponin. [facility] which revealed mild CAD and mildly elevated LVEDP. It was felt she had a combination of acute on chronic COPD exacerbation and diastolic CHF. Post cath she developed a RPH and need to be transfused. She also had acute on chronic renal insufficiency. She was discharged 7/28. [facility] again with respiratory failure, HTN, and slightly elevated Troponin. She is also felt to have HCAP. She has diuresed 1.4L.  Principal Problem:   Acute on chronic diastolic CHF (congestive heart failure) (HCC) Active  Problems:   Acute respiratory failure with hypoxia (HCC)   Chronic kidney disease (CKD), stage IV (severe) (HCC)   COPD (chronic obstructive pulmonary disease) (HCC)   HCAP (healthcare-associated pneumonia)   Demand ischemia (HCC)   Lung nodule   PAF (paroxysmal atrial fibrillation) (HCC)   PLAN: Now on PO diuretics, improving but overall very frail.  Corine ShelterLuke Kilroy PA-C 06/16/2016, 9:16 AM [phone]   Patient seen and discussed with PA Diona FantiKilroy, I agree with his documentation. Regarding her acute on chronic diastolic HF, changed to oral diuretics today lasix 40mg  daily. She had a paroxysmal episode of afib this admit, anticoag has not been started due to recent groin hematoma and retroperitoneal bleed but this will need to be reassessed at f/u, please indicate this clearly in the d/c summary. At time of discharge she is to f/u with Dr Elease HashimotoNahser per patient request. We will sign off inpatient care, I have asked our office to arrange f/u.   Dominga FerryJ Ferguson Gertner MD

## 2016-06-16 NOTE — Progress Notes (Signed)
PROGRESS NOTE  AMRI LIEN ZOX:[id number] DOB: 1934-06-26 DOA: 06/12/2016 PCP: Colette Ribas, MD  Brief Narrative: 61 yof presented with complaints of shortness of breath. While in the ED she was found to be hypoxic. Rest x-ray suggested pulmonary edema. Admitted for treatment of acute on chronic diastolic heart failure and acute on chronic hypoxia. Initially responded very well to diuretics with rapid improvement. Subsequently developed increasing shortness of breath, repeat chest x-ray demonstrated new left pleural effusion, infiltrate left greater than right concerning for infection. Started on broad-spectrum antibiotics.  Assessment/Plan: 1. Acute on chronichypoxic respiratory failure secondary to acute CHF and underlying COPD. appears stable on 4 L. May need chronic low dose steroids as she becomes more SOB once steroids are tapered off. Dr. Juanetta Gosling following. 2. HCAP, left pleural effusion. Continue vanc/cefepime/azithromycin. 3. Acute on chronic diastolicCHF.  LVEF of 60-65%. Grade 2 diastolic dysfunction. Volume status improved, Is and Os not adequately documented. Cardiology on board, Lasix has been switched to PO today. 4. Atrial fibrillation. Seems to have converted back to sinus rhythm. Not a candidate for anticoagulation at this point given recent retroperitoneal hematoma. 5. Demand ischemia secondary to acute CHF. No evidence of ACS. 6. Oxygen dependent COPD. Good air movement. No wheezing. Appears stable. 7. Chronic kidney disease stage IV, stable. 8. Retroperitoneal hematoma 06/10/2016, hemoglobin stable. No anticoagulation. 9. Abdominal aortic aneurysm, follow-up with vascular surgery as an outpatient 10. Pulmonary nodule concerning for malignancy. Follow-up as an outpatient.   She became more SOB this afternoon and would like to stay one more night prior to DC home.  Will give another 24 hours on broad-spectrum antibiotics and consider changing to PO in am.  DVT  prophylaxis: Lovenox Code Status: Full Family Communication: Family at bedside  Disposition Plan: Discharge home once improved; anticipate in 24 hours.   Consultants:  Cardiology  Palliative Medicine  Procedures:  Echo  Left ventricle: The cavity size was normal. Wall thickness was   increased in a pattern of mild LVH. Systolic function was normal.   The estimated ejection fraction was in the range of 60% to 65%.   Wall motion was normal; there were no regional wall motion   abnormalities. Features are consistent with a pseudonormal left   ventricular filling pattern, with concomitant abnormal relaxation   and increased filling pressure (grade 2 diastolic dysfunction).   Doppler parameters are consistent with high ventricular filling   pressure. - Aortic valve: Mildly calcified annulus. Trileaflet; mildly   thickened leaflets. Valve area (VTI): 2.47 cm^2. Valve area   (Vmax): 2.42 cm^2. Valve area (Vmean): 2.36 cm^2. - Mitral valve: Mildly to moderately calcified annulus. Mildly   thickened leaflets . There was mild to moderate regurgitation. - Left atrium: The atrium was moderately dilated. - Right atrium: The atrium was moderately dilated. - Pericardium, extracardiac: There is a large left pleural   effusion. - Technically adequate study.   Antimicrobials:  None   HPI/Subjective: Feels improved today.  Objective: Vitals:   06/16/16 0743 06/16/16 1216 06/16/16 1337 06/16/16 1408  BP:  (!) 148/56 (!) 146/54   Pulse:   75   Resp:   16   Temp:   98.3 F (36.8 C)   TempSrc:   Oral   SpO2: 98%  92% 91%  Weight:      Height:        Intake/Output Summary (Last 24 hours) at 06/16/16 1521 Last data filed at 06/16/16 1217  Gross per 24 hour  Intake  480 ml  Output              750 ml  Net             -270 ml     Filed Weights   06/13/16 0500 06/14/16 0805 06/15/16 0500  Weight: 54.2 kg (119 lb 7.8 oz) 52.8 kg (116 lb 6.4 oz) 51.7 kg (113 lb 15.7  oz)    Exam:    Constitutional:  . AA Ox3. Fatigued. ENMT:  . grossly normal hearing  Respiratory:  . CTA bilaterally, no w/r/r.  . Mild crackles posteriorly. No rhonchi. No wheezing. Mild increased respiratory effort. Breathing somewhat labored. Can speak in full sentences Cardiovascular:  . Irregular, no m/r/g . No LE extremity edema   . Telemetry afib with brief bursts of rapid heart rate. Abdomen:  . Soft, nontender, nondistended Musculoskeletal:   Moves all extremities to command Psychiatric:  . judgement and insight appear normal . Would not affect appear grossly normal  I have personally reviewed following labs and imaging studies:   Scheduled Meds: . allopurinol  100 mg Oral QHS  . amLODipine  10 mg Oral Daily  . atorvastatin  20 mg Oral q1800  . azithromycin  250 mg Oral Daily  . ceFEPime (MAXIPIME) IV  1 g Intravenous Q24H  . furosemide  40 mg Oral Daily  . hydrALAZINE  50 mg Oral Q6H  . ipratropium  0.5 mg Nebulization TID  . levalbuterol  0.63 mg Nebulization TID  . methylPREDNISolone (SOLU-MEDROL) injection  40 mg Intravenous Q12H  . metoprolol tartrate  50 mg Oral BID  . pantoprazole  40 mg Oral Daily  . sodium chloride flush  3 mL Intravenous Q12H  . vancomycin  750 mg Intravenous Q48H   Continuous Infusions:   Principal Problem:   Acute on chronic diastolic CHF (congestive heart failure) (HCC) Active Problems:   Chronic kidney disease (CKD), stage IV (severe) (HCC)   Lung nodule   Acute respiratory failure with hypoxia (HCC)   COPD (chronic obstructive pulmonary disease) (HCC)   HCAP (healthcare-associated pneumonia)   Demand ischemia (HCC)   PAF (paroxysmal atrial fibrillation) (HCC)   Palliative care encounter   Goals of care, counseling/discussion   DNR (do not resuscitate) discussion   LOS: 4 days   Time spent 25 minutes,greater than 50% in counseling and coordination of care   Peggye PittEstela Hernandez, MD [facility] Pager:  [phone]

## 2016-06-16 NOTE — Care Management Note (Addendum)
Case Management Note  Patient Details  Name: Tandy Gawlsie H Raulerson MRN: [id number] Date of Birth: January 22, 1934  Additional Comments: [facility] home [facility]. Spoke with [facility], who wishes for patient to be prescribed [facility]. Hospitalist agreeable to ordering Lasix vial at discharge.   Sherisse Fullilove, Chrystine OilerSharley Diane, RN 06/16/2016, 2:53 PM Addendum: [facility]. [facility]. Neb machine ordered for patient as well. Alroy BailiffLinda Lothian with [facility] notified and will obtain order from chart. HH will be resumed at discharge by [facility]. Patient may discharge today.

## 2016-06-16 NOTE — Consult Note (Signed)
Consultation Note Date: 06/16/2016   Patient Name: Sydney Jacobs  DOB: [birth year]  MRN: [id number]  Age / Sex: 82 y.o., female  PCP: Assunta FoundJohn Golding, MD Referring Physician: Micael HampshireEstela Y Hernandez Acost*  Reason for Consultation: Disposition, Establishing goals of care and Psychosocial/spiritual support  HPI/Patient Profile: 82 y.o. female  with past medical history of Hypertension, hyperlipidemia, history of stroke, relatively new diagnoses of COPD and CHF, 3rd admission in 6 weeks admitted on 06/12/2016 with acute on chronic hypoxia and diastolic heart failure.   Clinical Assessment and Goals of Care: Sydney Jacobs is resting quietly in bed, she has extended family at bedside. Family, including sister and brother-in-law and their spouses, leave so that Sydney Jacobs, spouse, Leonette MostCharles, and I are able to have a discussion. We talk about functional status, they share that they both participate in cooking, cleaning and laundry. Mr. Abigail Jacobs does grocery shopping, and Sydney Jacobs has not been driving for the last 6 weeks. She shares that she is able to get her own bath no problems. She denies mobility issues such as files.  We talk about how the heart works including electrical (a fib), vascular (recent cardiac catheter with retroperitoneal bleed), and mechanical (the heart as a pump).  We talk in detail about heart failure, dietary restrictions, fluid restrictions, heart failure management in general, we also talk about COPD, and how an exacerbation can sometimes actually be a heart failure/overload problem.  I encouraged her to pay attention to fluid overload through daily weights, monitoring swelling, and act quickly. I share that we have covered a lot of information and that we do not expect her to understand everything at this time, but encouraged her to keep learning.  Sydney Jacobs's brother, Loleta BooksSonny, enters during this  discussion.  We talk about healthcare power of attorney, Mrs. Zern names her husband is her Management consultantdecision maker. We also talk about advanced directives. The realities of his chest compressions, statistics related to effectiveness.  I ask if anyone has talked with him about these choices in the past, Mr. Abigail Jacobs states they asked this in the ED and I did not give her time to answer, but "I answered for her, that yes we wants CPR".  I share with Mr. Seider that Sydney Jacobs has the right to say what she does and does not want, and encourage him to continue the discussion related to code status and their choices for the future. Sydney Jacobs states that she would not want to live the rest of her life in a nursing home, even if it meant she were to die. Her husband states that she would never be allowed to live in a nursing home, he would take her home with him and take care of her himself. We talk about if this were to be necessary, that he would need help, initially from home health, possibly later with hospice.  Healthcare power of attorney NEXT OF KIN - husband Edd FabianCharles Gsell, no paperwork completed   SUMMARY OF RECOMMENDATIONS   continue to treat the  treatable, considering code status options, considering choices for treatment plan. At this point, all aggressive measures.    Code Status/Advance Care Planning:  Full code  Symptom Management:   per hospitalist  Palliative Prophylaxis:   Turn Reposition  Additional Recommendations (Limitations, Scope, Preferences):  Continue to treat the treatable at this time, considering code status options.  Psycho-social/Spiritual:   Desire for further Chaplaincy support:no  Additional Recommendations: Caregiving  Support/Resources and Education on Hospice  Prognosis:   < 6 months, likely based on chronic disease burden related to CHF, COPD, and 3 hospitalizations in the last 6 weeks.  Discharge Planning: Home with Home Health      Primary  Diagnoses: Present on Admission: . (Resolved) Acute diastolic CHF (congestive heart failure) (HCC) . Acute on chronic diastolic CHF (congestive heart failure) (HCC) . Acute respiratory failure with hypoxia (HCC) . COPD (chronic obstructive pulmonary disease) (HCC) . HCAP (healthcare-associated pneumonia) . Demand ischemia (HCC) . Chronic kidney disease (CKD), stage IV (severe) (HCC) . Lung nodule   I have reviewed the medical record, interviewed the patient and family, and examined the patient. The following aspects are pertinent.  Past Medical History:  Diagnosis Date  . Chronic diastolic CHF (congestive heart failure) (HCC)   . CKD (chronic kidney disease) stage 3, GFR 30-59 ml/min   . COPD (chronic obstructive pulmonary disease) (HCC)   . Essential hypertension   . History of stroke   . Hyperlipidemia   . Pulmonary nodule    Left upper lobe 1 cm pulmonary nodule - chest CT July 2017   Social History   Social History  . Marital status: Married    Spouse name: N/A  . Number of children: N/A  . Years of education: N/A   Social History Main Topics  . Smoking status: Former Smoker    Types: Cigarettes  . Smokeless tobacco: Never Used  . Alcohol use No  . Drug use: No  . Sexual activity: No   Other Topics Concern  . None   Social History Narrative  . None   Family History  Problem Relation Age of Onset  . Hypertension Mother   . Hypertension Father    Scheduled Meds: . allopurinol  100 mg Oral QHS  . amLODipine  10 mg Oral Daily  . atorvastatin  20 mg Oral q1800  . azithromycin  250 mg Oral Daily  . ceFEPime (MAXIPIME) IV  1 g Intravenous Q24H  . furosemide  40 mg Oral Daily  . hydrALAZINE  50 mg Oral Q6H  . ipratropium  0.5 mg Nebulization TID  . levalbuterol  0.63 mg Nebulization TID  . methylPREDNISolone (SOLU-MEDROL) injection  40 mg Intravenous Q12H  . metoprolol tartrate  50 mg Oral BID  . pantoprazole  40 mg Oral Daily  . sodium chloride flush  3  mL Intravenous Q12H  . vancomycin  750 mg Intravenous Q48H   Continuous Infusions:  PRN Meds:.sodium chloride, acetaminophen, ondansetron (ZOFRAN) IV, sodium chloride flush, zolpidem Medications Prior to Admission:  Prior to Admission medications   Medication Sig Start Date End Date Taking? Authorizing Provider  albuterol (PROVENTIL HFA;VENTOLIN HFA) 108 (90 Base) MCG/ACT inhaler Inhale 2 puffs into the lungs every 6 (six) hours as needed for wheezing or shortness of breath.   Yes Historical Provider, MD  amLODipine (NORVASC) 10 MG tablet Take 1 tablet (10 mg total) by mouth daily. 05/13/16  Yes Standley Brooking, MD  atorvastatin (LIPITOR) 20 MG tablet Take 1 tablet (20 mg total)  by mouth daily at 6 PM. 06/03/16  Yes Maryann Mikhail, DO  furosemide (LASIX) 20 MG tablet Take 1 tablet (20 mg total) by mouth daily. 06/03/16  Yes Maryann Mikhail, DO  hydrALAZINE (APRESOLINE) 50 MG tablet Take 1 tablet (50 mg total) by mouth every 6 (six) hours. 06/03/16  Yes Maryann Mikhail, DO  metoprolol (LOPRESSOR) 50 MG tablet Take 1 tablet (50 mg total) by mouth 2 (two) times daily. 06/03/16  Yes Maryann Mikhail, DO  pantoprazole (PROTONIX) 40 MG tablet Take 1 tablet (40 mg total) by mouth daily. 06/03/16  Yes Maryann Mikhail, DO  Vitamin D, Ergocalciferol, (DRISDOL) 50000 units CAPS capsule Take 1 capsule by mouth once a week. Takes on Fridays. 03/23/16  Yes Historical Provider, MD  cetirizine (ZYRTEC) 10 MG tablet Take 10 mg by mouth daily.    Historical Provider, MD   Allergies  Allergen Reactions  . Sulfa Antibiotics Other (See Comments)    unknown   Review of Systems  Unable to perform ROS: Other    Physical Exam  Constitutional: She is oriented to person, place, and time. No distress.  HENT:  Head: Normocephalic and atraumatic.  Cardiovascular: Normal rate and regular rhythm.   Pulmonary/Chest: Effort normal. No respiratory distress.  Abdominal: Soft. She exhibits no distension.  Neurological: She  is alert and oriented to person, place, and time.  Skin: Skin is warm and dry.  Psychiatric: She has a normal mood and affect. Her behavior is normal. Judgment and thought content normal.  Nursing note and vitals reviewed.   Vital Signs: BP (!) 148/56   Pulse 72   Temp 97.5 F (36.4 C) (Oral)   Resp 15   Ht  (1.473 m)   Wt 51.7 kg (113 lb 15.7 oz)   SpO2 98%   BMI 23.82 kg/m  Pain Assessment: No/denies pain   Pain Score: 2    SpO2: SpO2: 98 % O2 Device:SpO2: 98 % O2 Flow Rate: .O2 Flow Rate (L/min): 5 L/min  IO: Intake/output summary:  Intake/Output Summary (Last 24 hours) at 06/16/16 1325 Last data filed at 06/16/16 1217  Gross per 24 hour  Intake              480 ml  Output              750 ml  Net             -270 ml    LBM: Last BM Date: 06/15/16 Baseline Weight: Weight: 54 kg (119 lb) Most recent weight: Weight: 51.7 kg (113 lb 15.7 oz)     Palliative Assessment/Data:   Flowsheet Rows   Flowsheet Row Most Recent Value  Intake Tab  Referral Department  Hospitalist  Unit at Time of Referral  ICU  Palliative Care Primary Diagnosis  Cardiac  Date Notified  06/13/16  Palliative Care Type  New Palliative care  Reason for referral  Clarify Goals of Care  Date of Admission  06/12/16  Date first seen by Palliative Care  06/15/16  # of days Palliative referral response time  2 Day(s)  # of days IP prior to Palliative referral  1  Clinical Assessment  Palliative Performance Scale Score  40%  Pain Max last 24 hours  Not able to report  Pain Min Last 24 hours  Not able to report  Dyspnea Max Last 24 Hours  Not able to report  Dyspnea Min Last 24 hours  Not able to report  Psychosocial & Spiritual Assessment  Palliative Care Outcomes  Patient/Family meeting held?  Yes  Who was at the meeting?  Husband Leonette Most and brother Sonny  Palliative Care Outcomes  Provided advance care planning, Provided psychosocial or spiritual support, Clarified goals of care    Palliative Care follow-up planned  -- [Follow-up while at [facility]]      Time In: 1010 Time Out: 1120 Time Total: 70 minutes Greater than 50%  of this time was spent counseling and coordinating care related to the above assessment and plan.  Signed by: Katheran Awe, NP   Please contact Palliative Medicine Team phone at [phone] for questions and concerns.  For individual provider: See Loretha Stapler

## 2016-06-17 LAB — BASIC METABOLIC PANEL
ANION GAP: 6 (ref 5–15)
BUN: 79 mg/dL — AB (ref 6–20)
CO2: 28 mmol/L (ref 22–32)
Calcium: 8.2 mg/dL — ABNORMAL LOW (ref 8.9–10.3)
Chloride: 104 mmol/L (ref 101–111)
Creatinine, Ser: 2.41 mg/dL — ABNORMAL HIGH (ref 0.44–1.00)
GFR calc Af Amer: 21 mL/min — ABNORMAL LOW (ref >60)
GFR, EST NON AFRICAN AMERICAN: 18 mL/min — AB (ref >60)
GLUCOSE: 158 mg/dL — AB (ref 65–99)
POTASSIUM: 4.2 mmol/L (ref 3.5–5.1)
Sodium: 138 mmol/L (ref 135–145)

## 2016-06-17 LAB — CBC
HEMATOCRIT: 28.7 % — AB (ref 36.0–46.0)
HEMOGLOBIN: 9.5 g/dL — AB (ref 12.0–15.0)
MCH: 29 pg (ref 26.0–34.0)
MCHC: 33.1 g/dL (ref 30.0–36.0)
MCV: 87.5 fL (ref 78.0–100.0)
Platelets: 147 10*3/uL — ABNORMAL LOW (ref 150–400)
RBC: 3.28 MIL/uL — ABNORMAL LOW (ref 3.87–5.11)
RDW: 16.9 % — AB (ref 11.5–15.5)
WBC: 7.9 10*3/uL (ref 4.0–10.5)

## 2016-06-17 MED ORDER — IPRATROPIUM BROMIDE 0.02 % IN SOLN: 0.5000 mg | Freq: Three times a day (TID) | RESPIRATORY_TRACT | 12 refills | Status: DC

## 2016-06-17 MED ORDER — PREDNISONE 10 MG PO TABS: 10.0000 mg | ORAL_TABLET | Freq: Every day | ORAL | 0 refills | Status: DC

## 2016-06-17 MED ORDER — LEVALBUTEROL HCL 0.63 MG/3ML IN NEBU: 0.6300 mg | INHALATION_SOLUTION | Freq: Three times a day (TID) | RESPIRATORY_TRACT | 12 refills | Status: DC

## 2016-06-17 MED ORDER — LEVOFLOXACIN 500 MG PO TABS: 500.0000 mg | ORAL_TABLET | Freq: Every day | ORAL | 0 refills | Status: DC

## 2016-06-17 MED ORDER — FUROSEMIDE 10 MG/ML PO SOLN: ORAL | 0 refills | Status: DC

## 2016-06-17 NOTE — Progress Notes (Signed)
Patient discharged home.  IV removed - WNL.  Reviewed DC instructions and medications.  No questions at this time. Verbalized understanding.  Patient able to verbalize HF management at home as well.  Patient in NAD, assisted off unit via WC

## 2016-06-17 NOTE — Care Management Important Message (Signed)
Important Message  Patient Details  Name: Sydney Jacobs MRN: [id number] Date of Birth: 21-Feb-1934   Medicare Important Message Given:  Yes    Nykole Matos, Chrystine OilerSharley Diane, RN 06/17/2016, 11:31 AM

## 2016-06-17 NOTE — Progress Notes (Signed)
Subjective: She says she feels better. She is interested in having a nebulizer at home. I think that would be helpful.  Objective: Vital signs in last 24 hours: Temp:  [97.7 F (36.5 C)-98.3 F (36.8 C)] 97.7 F (36.5 C) (08/11 0400) Pulse Rate:  [75-88] 80 (08/11 0400) Resp:  [16-18] 18 (08/11 0400) BP: (145-167)/(54-75) 167/75 (08/11 0400) SpO2:  [91 %-95 %] 95 % (08/11 0722) Weight:  [52.4 kg (115 lb 8 oz)] 52.4 kg (115 lb 8 oz) (08/11 0400) Weight change:  Last BM Date: 06/15/16  Intake/Output from previous day: 08/10 0701 - 08/11 0700 In: 870 [P.O.:720; IV Piggyback:150] Out: 900 [Urine:900]  PHYSICAL EXAM General appearance: alert, cooperative and no distress Resp: She has minimal wheezing anteriorly bilaterally Cardio: regular rate and rhythm, S1, S2 normal, no murmur, click, rub or gallop GI: soft, non-tender; bowel sounds normal; no masses,  no organomegaly Extremities: extremities normal, atraumatic, no cyanosis or edema  Lab Results:  Results for orders placed or performed during the hospital encounter of 06/12/16 (from the past 48 hour(s))  Basic metabolic panel     Status: Abnormal   Collection Time: 06/16/16  6:43 AM  Result Value Ref Range   Sodium 140 135 - 145 mmol/L   Potassium 4.3 3.5 - 5.1 mmol/L   Chloride 102 101 - 111 mmol/L   CO2 29 22 - 32 mmol/L   Glucose, Bld 138 (H) 65 - 99 mg/dL   BUN 71 (H) 6 - 20 mg/dL   Creatinine, Ser 2.11 (H) 0.44 - 1.00 mg/dL   Calcium 8.3 (L) 8.9 - 10.3 mg/dL   GFR calc non Af Amer 21 (L) >60 mL/min   GFR calc Af Amer 25 (L) >60 mL/min    Comment: (NOTE) The eGFR has been calculated using the CKD EPI equation. This calculation has not been validated in all clinical situations. eGFR's persistently <60 mL/min signify possible Chronic Kidney Disease.    Anion gap 9 5 - 15  CBC     Status: Abnormal   Collection Time: 06/16/16  6:43 AM  Result Value Ref Range   WBC 7.6 4.0 - 10.5 K/uL   RBC 3.33 (L) 3.87 - 5.11  MIL/uL   Hemoglobin 9.4 (L) 12.0 - 15.0 g/dL   HCT 29.3 (L) 36.0 - 46.0 %   MCV 88.0 78.0 - 100.0 fL   MCH 28.2 26.0 - 34.0 pg   MCHC 32.1 30.0 - 36.0 g/dL   RDW 16.9 (H) 11.5 - 15.5 %   Platelets 150 150 - 400 K/uL  Basic metabolic panel     Status: Abnormal   Collection Time: 06/17/16  6:19 AM  Result Value Ref Range   Sodium 138 135 - 145 mmol/L   Potassium 4.2 3.5 - 5.1 mmol/L   Chloride 104 101 - 111 mmol/L   CO2 28 22 - 32 mmol/L   Glucose, Bld 158 (H) 65 - 99 mg/dL   BUN 79 (H) 6 - 20 mg/dL   Creatinine, Ser 2.41 (H) 0.44 - 1.00 mg/dL   Calcium 8.2 (L) 8.9 - 10.3 mg/dL   GFR calc non Af Amer 18 (L) >60 mL/min   GFR calc Af Amer 21 (L) >60 mL/min    Comment: (NOTE) The eGFR has been calculated using the CKD EPI equation. This calculation has not been validated in all clinical situations. eGFR's persistently <60 mL/min signify possible Chronic Kidney Disease.    Anion gap 6 5 - 15  CBC  Status: Abnormal   Collection Time: 06/17/16  6:19 AM  Result Value Ref Range   WBC 7.9 4.0 - 10.5 K/uL   RBC 3.28 (L) 3.87 - 5.11 MIL/uL   Hemoglobin 9.5 (L) 12.0 - 15.0 g/dL   HCT 28.7 (L) 36.0 - 46.0 %   MCV 87.5 78.0 - 100.0 fL   MCH 29.0 26.0 - 34.0 pg   MCHC 33.1 30.0 - 36.0 g/dL   RDW 16.9 (H) 11.5 - 15.5 %   Platelets 147 (L) 150 - 400 K/uL    ABGS No results for input(s): PHART, PO2ART, TCO2, HCO3 in the last 72 hours.  Invalid input(s): PCO2 CULTURES Recent Results (from the past 240 hour(s))  MRSA PCR Screening     Status: None   Collection Time: 06/12/16  4:05 PM  Result Value Ref Range Status   MRSA by PCR NEGATIVE NEGATIVE Final    Comment:        The GeneXpert MRSA Assay (FDA approved for NASAL specimens only), is one component of a comprehensive MRSA colonization surveillance program. It is not intended to diagnose MRSA infection nor to guide or monitor treatment for MRSA infections.    Studies/Results: No results found.  Medications:  Prior  to Admission:  Prescriptions Prior to Admission  Medication Sig Dispense Refill Last Dose  . albuterol (PROVENTIL HFA;VENTOLIN HFA) 108 (90 Base) MCG/ACT inhaler Inhale 2 puffs into the lungs every 6 (six) hours as needed for wheezing or shortness of breath.   unknown  . amLODipine (NORVASC) 10 MG tablet Take 1 tablet (10 mg total) by mouth daily. 30 tablet 0 06/12/2016 at Unknown time  . atorvastatin (LIPITOR) 20 MG tablet Take 1 tablet (20 mg total) by mouth daily at 6 PM. 30 tablet 0 06/11/2016 at Unknown time  . furosemide (LASIX) 20 MG tablet Take 1 tablet (20 mg total) by mouth daily. 30 tablet 0 06/12/2016 at Unknown time  . hydrALAZINE (APRESOLINE) 50 MG tablet Take 1 tablet (50 mg total) by mouth every 6 (six) hours. 120 tablet 0 06/12/2016 at Unknown time  . metoprolol (LOPRESSOR) 50 MG tablet Take 1 tablet (50 mg total) by mouth 2 (two) times daily. 60 tablet 0 06/12/2016 at Unknown time  . pantoprazole (PROTONIX) 40 MG tablet Take 1 tablet (40 mg total) by mouth daily. 30 tablet 0 06/12/2016 at Unknown time  . Vitamin D, Ergocalciferol, (DRISDOL) 50000 units CAPS capsule Take 1 capsule by mouth once a week. Takes on Fridays.   Past Week at Unknown time  . cetirizine (ZYRTEC) 10 MG tablet Take 10 mg by mouth daily.   06/12/2016   Scheduled: . allopurinol  100 mg Oral QHS  . amLODipine  10 mg Oral Daily  . atorvastatin  20 mg Oral q1800  . azithromycin  250 mg Oral Daily  . ceFEPime (MAXIPIME) IV  1 g Intravenous Q24H  . furosemide  40 mg Oral Daily  . hydrALAZINE  50 mg Oral Q6H  . ipratropium  0.5 mg Nebulization TID  . levalbuterol  0.63 mg Nebulization TID  . methylPREDNISolone (SOLU-MEDROL) injection  40 mg Intravenous Q12H  . metoprolol tartrate  50 mg Oral BID  . pantoprazole  40 mg Oral Daily  . sodium chloride flush  3 mL Intravenous Q12H  . vancomycin  750 mg Intravenous Q48H   Continuous:  DXI:PJASNK chloride, acetaminophen, ondansetron (ZOFRAN) IV, sodium chloride flush,  zolpidem  Assesment: She was admitted with acute hypoxic respiratory failure. This is on chronic  hypoxic respiratory failure. She has healthcare associated pneumonia and acute on chronic diastolic heart failure. She has improved. Her long-term outlook is poor. Principal Problem:   Acute on chronic diastolic CHF (congestive heart failure) (HCC) Active Problems:   Chronic kidney disease (CKD), stage IV (severe) (HCC)   Lung nodule   Acute respiratory failure with hypoxia (HCC)   COPD (chronic obstructive pulmonary disease) (HCC)   HCAP (healthcare-associated pneumonia)   Demand ischemia (HCC)   PAF (paroxysmal atrial fibrillation) ([place])   Palliative care encounter   Goals of care, counseling/discussion   DNR (do not resuscitate) discussion    Plan: I think she is going home today. I do think she would be benefited by a nebulizer at home. I will see her in my office next week    LOS: 5 days   Sydney Jacobs L 06/17/2016, 8:31 AM

## 2016-06-17 NOTE — Discharge Summary (Signed)
Physician Discharge Summary  Sydney Jacobs:[id number] DOB: [birth year] DOA: 06/12/2016  PCP: Colette Ribas, MD  Admit date: 06/12/2016 Discharge date: 06/17/2016  Time spent: 45 minutes  Recommendations for Outpatient Follow-up:  -Will be discharged home today. -Has follow up with Dr. Juanetta Gosling next week. -5 days of levaquin remaining on DC. -Has PAF, but is NOT on anticoagulation given recent retroperitoneal bleed; this will need to be readdressed at time of follow up appointment. -F/u has been scheduled with cardiology as well.   Discharge Diagnoses:  Principal Problem:   Acute on chronic diastolic CHF (congestive heart failure) (HCC) Active Problems:   Chronic kidney disease (CKD), stage IV (severe) (HCC)   Lung nodule   Acute respiratory failure with hypoxia (HCC)   COPD (chronic obstructive pulmonary disease) (HCC)   HCAP (healthcare-associated pneumonia)   Demand ischemia (HCC)   PAF (paroxysmal atrial fibrillation) (HCC)   Palliative care encounter   Goals of care, counseling/discussion   DNR (do not resuscitate) discussion   Discharge Condition: Stable and improved  Filed Weights   06/14/16 0805 06/15/16 0500 06/17/16 0400  Weight: 52.8 kg (116 lb 6.4 oz) 51.7 kg (113 lb 15.7 oz) 52.4 kg (115 lb 8 oz)    History of present illness:  As per Dr. Irene Jacobs on 41/49: 82 year old woman with chronic diastolic heart failure, third admission in 6 weeks, presented to the emergency department with acute shortness of breath. Initial evaluation suggested acute on chronic hypoxia and diastolic heart failure.   Admitted early July with hypertensive urgency, COPD exacerbation, acute diastolic heart failure.   Secondadmission July 17-28 with acute respiratory failure with hypoxia, acute on chronic diastolic congestive heart failure. Transfered to [facility] which revealed mild coronary artery disease and mildly elevated LVEDP. Post catheterization she developed a  retroperitoneal hematoma requiring transfusion.   She was seen in the office by cardiology August 4 and was noted to be doing well at that time.  8/4 was a good day, no issues. Did well 8/5. That evening she awoke from sleep with acute onset of shortness of breath. No chest pain. No specific aggravating or alleviating factors mentioned. She's been compliant with diet and medications.  Hospital Course:   1. Acute on chronichypoxic respiratory failure secondary to acute CHF and underlying COPD. appears stable on 4 L. May need chronic low dose steroids as she becomes more SOB once steroids are tapered off. Dr. Juanetta Gosling following. 2. HCAP, left pleural effusion. Has been transitioned over to levaquin for 5 more days on DC. All cx data remains negative to date. 3. Acute on chronic diastolicCHF.  LVEF of 60-65%. Grade 2 diastolic dysfunction. Volume status improved, Is and Os not adequately documented. Cardiology on board, Lasix has been switched to PO today. 4. Atrial fibrillation. Seems to have converted back to sinus rhythm. Not a candidate for anticoagulation at this point given recent retroperitoneal hematoma. 5. Demand ischemia secondary to acute CHF. No evidence of ACS. 6. Oxygen dependent COPD. Good air movement. No wheezing. Appears stable. 7. Chronic kidney disease stage IV, stable. 8. Retroperitoneal hematoma 06/10/2016, hemoglobin stable. No anticoagulation. 9. Abdominal aortic aneurysm, follow-up with vascular surgery as an outpatient 10. Pulmonary nodule concerning for malignancy. Follow-up as an outpatient.  Procedures:  None   Consultations:  Cardiology  Pulmonology  Discharge Instructions  Discharge Instructions    Diet - low sodium heart healthy    Complete by:  As directed   Increase activity slowly  Complete by:  As directed       Medication List    TAKE these medications   albuterol 108 (90 Base) MCG/ACT inhaler Commonly known as:  PROVENTIL HFA;VENTOLIN  HFA Inhale 2 puffs into the lungs every 6 (six) hours as needed for wheezing or shortness of breath.   amLODipine 10 MG tablet Commonly known as:  NORVASC Take 1 tablet (10 mg total) by mouth daily.   atorvastatin 20 MG tablet Commonly known as:  LIPITOR Take 1 tablet (20 mg total) by mouth daily at 6 PM.   cetirizine 10 MG tablet Commonly known as:  ZYRTEC Take 10 mg by mouth daily.   furosemide 20 MG tablet Commonly known as:  LASIX Take 1 tablet (20 mg total) by mouth daily. What changed:  Another medication with the same name was added. Make sure you understand how and when to take each.   furosemide 10 MG/ML solution Commonly known as:  LASIX Instructions: "dose to be ordered by MD PRN as clinically indicated" What changed:  You were already taking a medication with the same name, and this prescription was added. Make sure you understand how and when to take each.   hydrALAZINE 50 MG tablet Commonly known as:  APRESOLINE Take 1 tablet (50 mg total) by mouth every 6 (six) hours.   ipratropium 0.02 % nebulizer solution Commonly known as:  ATROVENT Take 2.5 mLs (0.5 mg total) by nebulization 3 (three) times daily.   levalbuterol 0.63 MG/3ML nebulizer solution Commonly known as:  XOPENEX Take 3 mLs (0.63 mg total) by nebulization 3 (three) times daily.   levofloxacin 500 MG tablet Commonly known as:  LEVAQUIN Take 1 tablet (500 mg total) by mouth daily.   metoprolol 50 MG tablet Commonly known as:  LOPRESSOR Take 1 tablet (50 mg total) by mouth 2 (two) times daily.   pantoprazole 40 MG tablet Commonly known as:  PROTONIX Take 1 tablet (40 mg total) by mouth daily.   predniSONE 10 MG tablet Commonly known as:  DELTASONE Take 1 tablet (10 mg total) by mouth daily with breakfast. Take 6 tablets today and then decrease by 1 tablet daily until none are left.   Vitamin D (Ergocalciferol) 50000 units Caps capsule Commonly known as:  DRISDOL Take 1 capsule by mouth  once a week. Takes on Fridays.      Allergies  Allergen Reactions  . Sulfa Antibiotics Other (See Comments)    unknown   Follow-up Information    Dayna N Dunn, PA-C Follow up on 07/06/2016.   Specialties:  Cardiology, Radiology Why:  at 10:00 am Contact information: [address] [place] [address] [phone]            The results of significant diagnostics from this hospitalization (including imaging, microbiology, ancillary and laboratory) are listed below for reference.    Significant Diagnostic Studies: Ct Abdomen Pelvis Wo Contrast  Result Date: 05/29/2016 CLINICAL DATA:  Recent catheterization in the right groin. Anemia and abdominal wall bruising. Evaluation for hematoma. EXAM: CT ABDOMEN AND PELVIS WITHOUT CONTRAST TECHNIQUE: Multidetector CT imaging of the abdomen and pelvis was performed following the standard protocol without IV contrast. COMPARISON:  Renal ultrasound 01/06/2014 FINDINGS: Small bilateral pleural effusions are partially visualized with overlying bilateral lower lobe atelectasis. Three-vessel coronary artery calcification. Mitral annular calcification. The liver, gallbladder, and spleen are unremarkable. There is minimal nodularity of the left adrenal gland. There is marked left renal atrophy with a 4.0 cm low-density lesion  extending anteriorly from the interpolar kidney likely representing a cyst. Subcentimeter hyperdense lesions in the upper pole of the left kidney likely represent hemorrhagic/proteinaceous cysts. A large right upper pole renal cyst measures 10.8 cm. The right kidney is inferiorly located/ectopic and abnormally rotated. A 1.4 cm right renal sinus cyst is noted inferiorly. There is no hydronephrosis. The right adrenal gland is not well seen due to regional distortion by the large renal cyst. A couple of punctate calcifications are noted in the pancreas. There is no evidence of bowel obstruction. There is left-sided  colonic diverticulosis without evidence of diverticulitis. Assessment of the pelvis is partially limited by streak artifact from the patient's right total hip arthroplasty. The bladder is grossly unremarkable. The uterus is absent. Stranding is seen in the soft tissues of the right inguinal region consistent with recent catheterization. There is a 3.7 x 3.7 cm hematoma in the right groin. There is also small to moderate volume right-sided retroperitoneal hemorrhage in the pelvis measuring up to 7.1 x 3.7 cm on axial images. There is extensive atherosclerotic calcification of the aorta and its major branch vessels. There is aneurysmal dilatation of the infrarenal abdominal aorta measuring 4.4 x 4.1 cm with displacement of intimal calcifications centrally into the lumen. No enlarged lymph nodes are identified. All multilevel disc and facet degeneration in the lumbar spine. IMPRESSION: 1. Small right groin hematoma and small to moderate sized right-sided pelvic retroperitoneal hematoma. 2. Small bilateral pleural effusions and bibasilar atelectasis. 3. Bilateral renal lesions as above including a large right upper pole cyst. The marked left renal atrophy. 4. 4.4 cm infrarenal abdominal aortic aneurysm. Calcifications central in the lumen may reflect calcified plaque/thrombus although a dissection flap could also give this appearance. These results will be called to the ordering clinician or representative by the Radiologist Assistant, and communication documented in the PACS or zVision Dashboard. Electronically Signed   By: Sebastian Ache M.D.   On: 05/29/2016 00:46  Dg Chest 2 View  Result Date: 06/01/2016 CLINICAL DATA:  Short of breath for 1 week. History of COPD and CHF. EXAM: CHEST  2 VIEW COMPARISON:  05/28/2016 FINDINGS: Cardiac silhouette is top-normal in size. The aorta is mildly uncoiled. No mediastinal or hilar masses or evidence of adenopathy. Minimal left pleural effusion with associated posterior lower  lobe opacity, likely atelectasis. This has improved. Prominent bronchovascular markings and mild interstitial thickening is noted similar to the recent prior studies. No new areas of lung opacity. No pneumothorax. IMPRESSION: 1. Improvement in left lung base opacity. Minimal residual left pleural effusion with associated atelectasis. 2. Mild interstitial thickening and bronchovascular prominence suggesting mild residual congestive heart failure. Electronically Signed   By: Amie Portland M.D.   On: 06/01/2016 12:59  US Renal  Result Date: 06/02/2016 CLINICAL DATA:  Acute kidney injury. History of hypertension, chronic kidney disease stage 3. EXAM: RENAL / URINARY TRACT ULTRASOUND COMPLETE COMPARISON:  CT 05/28/2016 FINDINGS: Right Kidney: Length: 16.1 cm. Rotated and positioned in the right mid abdomen. No hydronephrosis. Echogenicity appears normal. A simple cyst is 13.1 x 7.6 x 11.4 cm. Left Kidney: Length: 8.4 cm. Cysts are identified measuring 5.2 x 3.1 x 3.6 cm and 1.1 x 1.4 x 1.2 cm. Renal parenchyma appears echogenic. Bladder: Appears normal for degree of bladder distention. IMPRESSION: 1. Bilateral renal cysts. 2. Echogenic renal parenchyma of left greater than right. Electronically Signed   By: Norva Pavlov M.D.   On: 06/02/2016 12:45  Dg Chest [facility] 1 View  Result  Date: 06/14/2016 CLINICAL DATA:  Increased shortness of breath. EXAM: PORTABLE CHEST 1 VIEW COMPARISON:  Radiograph of June 12, 2016. FINDINGS: Stable cardiomediastinal silhouette. Atherosclerosis of thoracic aorta is noted. No pneumothorax is noted. Increased interstitial densities are noted throughout both lungs concerning for pulmonary edema or atypical infection, right worse than left. Moderate left pleural effusion is noted with associated atelectasis or infiltrate. Bony thorax is unremarkable. IMPRESSION: Aortic atherosclerosis. Increased diffuse interstitial densities are noted throughout both lungs, right greater than left,  concerning for pulmonary edema or atypical infection. Moderate left pleural effusion is noted with associated atelectasis or infiltrate. Electronically Signed   By: Lupita Raider, M.D.   On: 06/14/2016 14:04   Dg Chest Port 1 View  Result Date: 06/12/2016 CLINICAL DATA:  Shortness of breath EXAM: PORTABLE CHEST 1 VIEW COMPARISON:  06/01/2016 FINDINGS: The heart size appears normal. There is aortic atherosclerosis. Moderate diffuse pulmonary edema is identified. There are bilateral lower lobe airspace opacities noted peer IMPRESSION: 1. Suspect CHF. 2. Bilateral lower lobe opacities which may reflect areas of edema or infection. Electronically Signed   By: Signa Kell M.D.   On: 06/12/2016 13:40   Dg Chest Port 1 View  Result Date: 05/28/2016 CLINICAL DATA:  Shortness of breath and chest pain. EXAM: PORTABLE CHEST 1 VIEW COMPARISON:  05/24/2016 FINDINGS: There is bibasilar atelectasis and a small left pleural effusion. No overt pulmonary edema. The heart size and mediastinal contours are normal. IMPRESSION: Bibasilar atelectasis with small left pleural effusion. Electronically Signed   By: Irish Lack M.D.   On: 05/28/2016 09:57   Dg Chest Port 1 View  Result Date: 05/24/2016 CLINICAL DATA:  Patient with respiratory failure. EXAM: PORTABLE CHEST 1 VIEW COMPARISON:  Chest radiograph 05/23/2016. FINDINGS: Multiple monitoring leads overlie the patient. Stable cardiomegaly with tortuosity and calcification of the thoracic aorta. Persistent bibasilar airspace opacities. Small bilateral pleural effusions. No pneumothorax. Known 1 cm left upper lobe nodule not well demonstrated on current evaluation. IMPRESSION: Unchanged bibasilar airspace opacities and small bilateral pleural effusions. Aortic vascular calcification. Electronically Signed   By: Annia Belt M.D.   On: 05/24/2016 09:52   Dg Chest Portable 1 View  Result Date: 05/23/2016 CLINICAL DATA:  Shortness of breath beginning this morning.  EXAM: PORTABLE CHEST 1 VIEW COMPARISON:  Single-view of the chest 05/12/2016 and 05/08/2016. CT chest 05/08/2016. FINDINGS: The lungs are emphysematous. Since the most recent examination, right basilar airspace disease has worsened. The patient has a new small left pleural effusion with increased left basilar airspace disease. Small right effusion is noted. No pneumothorax. Heart size is normal. 1 cm left upper lobe nodule seen on the prior chest CT is not visible on this examination. Aortic atherosclerosis is noted. No focal bony abnormality. IMPRESSION: Increased bibasilar airspace disease and small effusions worrisome for pneumonia and/or pulmonary edema. Emphysema. Atherosclerosis. Electronically Signed   By: Drusilla Kanner M.D.   On: 05/23/2016 13:50    Microbiology: Recent Results (from the past 240 hour(s))  MRSA PCR Screening     Status: None   Collection Time: 06/12/16  4:05 PM  Result Value Ref Range Status   MRSA by PCR NEGATIVE NEGATIVE Final    Comment:        The GeneXpert MRSA Assay (FDA approved for NASAL specimens only), is one component of a comprehensive MRSA colonization surveillance program. It is not intended to diagnose MRSA infection nor to guide or monitor treatment for MRSA infections.  Labs: Basic Metabolic Panel:  Recent Labs Lab 06/13/16 0540 06/14/16 0559 06/14/16 1130 06/15/16 0605 06/16/16 0643 06/17/16 0619  NA 141 144  --  141 140 138  K 3.6 3.3*  --  4.1 4.3 4.2  CL 102 102  --  104 102 104  CO2 29 28  --  29 29 28   GLUCOSE 177* 97  --  137* 138* 158*  BUN 69* 69*  --  64* 71* 79*  CREATININE 2.28* 2.15*  --  2.02* 2.11* 2.41*  CALCIUM 7.9* 8.3*  --  8.1* 8.3* 8.2*  MG  --   --  1.8  --   --   --    Liver Function Tests:  Recent Labs Lab 06/12/16 1237  AST 19  ALT 22  ALKPHOS 55  BILITOT 1.2  PROT 6.0*  ALBUMIN 3.4*   No results for input(s): LIPASE, AMYLASE in the last 168 hours. No results for input(s): AMMONIA in the  last 168 hours. CBC:  Recent Labs Lab 06/12/16 1237 06/13/16 0540 06/15/16 0605 06/16/16 0643 06/17/16 0619  WBC 11.4* 7.0 7.7 7.6 7.9  NEUTROABS 9.7*  --   --   --   --   HGB 10.2* 9.6* 9.4* 9.4* 9.5*  HCT 30.9* 28.8* 28.5* 29.3* 28.7*  MCV 88.5 87.8 88.2 88.0 87.5  PLT 138* 144* 152 150 147*   Cardiac Enzymes:  Recent Labs Lab 06/12/16 1237 06/12/16 1742 06/13/16 0104 06/13/16 0540  TROPONINI 0.04* 0.04* 0.03* 0.03*   BNP: BNP (last 3 results)  Recent Labs  05/08/16 0937 05/23/16 1330 06/12/16 1237  BNP 908.0* 1,216.0* 2,185.0*    ProBNP (last 3 results) No results for input(s): PROBNP in the last 8760 hours.  CBG: No results for input(s): GLUCAP in the last 168 hours.     SignedChaya Jan:  HERNANDEZ ACOSTA,ESTELA  [facility] Pager: [phone] 06/17/2016, 3:56 PM

## 2016-07-05 ENCOUNTER — Encounter: Payer: Self-pay | Admitting: Physician Assistant

## 2016-07-05 DIAGNOSIS — J9610 Chronic respiratory failure, unspecified whether with hypoxia or hypercapnia: Secondary | ICD-10-CM | POA: Insufficient documentation

## 2016-07-05 DIAGNOSIS — I2510 Atherosclerotic heart disease of native coronary artery without angina pectoris: Secondary | ICD-10-CM | POA: Insufficient documentation

## 2016-07-05 DIAGNOSIS — N183 Chronic kidney disease, stage 3 unspecified: Secondary | ICD-10-CM | POA: Insufficient documentation

## 2016-07-05 DIAGNOSIS — I5032 Chronic diastolic (congestive) heart failure: Secondary | ICD-10-CM | POA: Insufficient documentation

## 2016-07-05 DIAGNOSIS — I714 Abdominal aortic aneurysm, without rupture, unspecified: Secondary | ICD-10-CM | POA: Insufficient documentation

## 2016-07-05 DIAGNOSIS — D649 Anemia, unspecified: Secondary | ICD-10-CM | POA: Insufficient documentation

## 2016-07-05 NOTE — Progress Notes (Signed)
Cardiology Office Note    Date:  07/06/2016  ID:  Sydney Jacobs, DOB [birth year], MRN [id number] PCP:  Colette RibasGOLDING, JOHN CABOT, MD  Cardiologist:  Diona BrownerMcDowell previously but patient's family follows with Dr. Elease HashimotoNahser and wants to f/u with him.   Chief Complaint: f/u CHF  History of Present Illness:  Sydney Jacobs is a 82 y.o. female with history of chronic diastolic CHF, COPD with recently chronic respiratory failure, AAA (4.4cm by CT abd 05/29/16), pulmonary nodule concerning for malignancy (by CT 05/08/16), CKD stage III-IV (recent baseline unclear - 1.4-2.4) paroxysmal atrial fib, LBBB, chronic anemia in 2017 who presents for post-hospital follow-up.  Has had rough course lately - recently discharged for 3rd time in 6 weeks.   - Admitted early July 2017 with hypertensive urgency, COPD exacerbation, acute diastolic heart failure.  - 2nd admission July 17-28 with acute respiratory failure with hypoxia, acute on chronic diastolic congestive heart failure. [facility] [place] [facility] (25% pRCA, 25% ramus) and mildly elevated LVEDP. Post catheterization she developed a retroperitoneal hematoma requiring transfusion.  - Adm 8/6-8/11/17 with acute resp failure, HCAP, acute on chronic diastolic CHF, demand ischemia, also noted to have PAF - not felt to be a candidate for anticoagulation given recent RPH. 2D Echo 06/13/16: mild LVH, EF 60-65%, grade 2 DD, high vent filling pressure, mild-mod MR, large L pleural eff, mod LAE/RAE. Seen by palliative care and patient elected all aggressive measures for now. IM recommended OP f/u of pulm nodule. DC Weight 115lb.  Cr 2.41, Hgb 9.4.  She returns for follow-up feeling about the same as she did towards recent hospitalization. This is how it's been since June, she says - still SOB with minimal activity, has pale puffy BLE and edema of hands. Weight up 5lbs. No chest pain. Compliant with salt, fluid restriction, weighs  daily. HHRN following - she says her BP runs 130s-150s at home. It was 180/80 on first check here, 172/82 on subsequent recheck. Took meds today.   Past Medical History:  Diagnosis Date  . AAA (abdominal aortic aneurysm) (HCC)    a. 4.4cm by CT abd 05/29/16.  Marland Kitchen. Anemia, unspecified   . Chronic diastolic CHF (congestive heart failure) (HCC)   . Chronic respiratory failure (HCC)   . CKD (chronic kidney disease) stage 3, GFR 30-59 ml/min   . COPD (chronic obstructive pulmonary disease) (HCC)   . Essential hypertension   . History of stroke   . Hyperlipidemia   . Mild CAD    a. LHC 05/2016 - 25% pRCA, 25% ramus, and mildly elevated LVEDP. c/b post-cath RPH.  . On home oxygen therapy   . PAF (paroxysmal atrial fibrillation) (HCC) 06/2016   a. dx 06/2016 - not a candidate for anticoag given recent retroperitoneal hematoma.  . Pulmonary nodule    Left upper lobe 1 cm pulmonary nodule - chest CT July 2017  . Retroperitoneal hematoma    a. after cath 05/2016 requiring txfusion.    Past Surgical History:  Procedure Laterality Date  . ABDOMINAL HYSTERECTOMY    . APPENDECTOMY    . CARDIAC CATHETERIZATION N/A 05/26/2016   Procedure: Left Heart Cath and Coronary Angiography;  Surgeon: Corky CraftsJayadeep S Varanasi, MD;  [facility];  Service: Cardiovascular;  Laterality: N/A;  . CAROTID ENDARTERECTOMY Left   . HEMORRHOID SURGERY    . PARTIAL HIP ARTHROPLASTY    . TONSILLECTOMY      Current Medications: Current Outpatient Prescriptions  Medication Sig Dispense Refill  . albuterol (PROVENTIL HFA;VENTOLIN HFA) 108 (90 Base) MCG/ACT inhaler Inhale 2 puffs into the lungs every 6 (six) hours as needed for wheezing or shortness of breath.    Marland Kitchen amLODipine (NORVASC) 10 MG tablet Take 1 tablet (10 mg total) by mouth daily. 30 tablet 0  . atorvastatin (LIPITOR) 20 MG tablet Take 1 tablet (20 mg total) by mouth daily at 6 PM. 30 tablet 0  . cetirizine (ZYRTEC) 10 MG tablet Take 10 mg by mouth  daily.    . furosemide (LASIX) 10 MG/ML solution Instructions: "dose to be ordered by MD PRN as clinically indicated" 10 mL 0  . furosemide (LASIX) 20 MG tablet Take 1 tablet (20 mg total) by mouth daily. 30 tablet 0  . hydrALAZINE (APRESOLINE) 50 MG tablet Take 1 tablet (50 mg total) by mouth every 6 (six) hours. 120 tablet 0  . ipratropium (ATROVENT) 0.02 % nebulizer solution Take 2.5 mLs (0.5 mg total) by nebulization 3 (three) times daily. 75 mL 12  . levalbuterol (XOPENEX) 0.63 MG/3ML nebulizer solution Take 3 mLs (0.63 mg total) by nebulization 3 (three) times daily. 3 mL 12  . levofloxacin (LEVAQUIN) 500 MG tablet Take 1 tablet (500 mg total) by mouth daily. 5 tablet 0  . metoprolol (LOPRESSOR) 50 MG tablet Take 1 tablet (50 mg total) by mouth 2 (two) times daily. 60 tablet 0  . pantoprazole (PROTONIX) 40 MG tablet Take 1 tablet (40 mg total) by mouth daily. 30 tablet 0  . predniSONE (DELTASONE) 10 MG tablet Take 1 tablet (10 mg total) by mouth daily with breakfast. Take 6 tablets today and then decrease by 1 tablet daily until none are left. 21 tablet 0  . Vitamin D, Ergocalciferol, (DRISDOL) 50000 units CAPS capsule Take 1 capsule by mouth once a week. Takes on Fridays.     No current facility-administered medications for this visit.      Allergies:   Sulfa antibiotics   Social History   Social History  . Marital status: Married    Spouse name: N/A  . Number of children: N/A  . Years of education: N/A   Social History Main Topics  . Smoking status: Former Smoker    Types: Cigarettes  . Smokeless tobacco: Never Used  . Alcohol use No  . Drug use: No  . Sexual activity: No   Other Topics Concern  . None   Social History Narrative  . None     Family History:  The patient's family history includes Hypertension in her father and mother.   ROS:   Please see the history of present illness.  All other systems are reviewed and otherwise negative.    PHYSICAL EXAM:     VS:  BP (!) 180/80   Pulse 74   Ht 4\' 10"  (1.473 m)   Wt 120 lb (54.4 kg)   BMI 25.08 kg/m   BMI: Body mass index is 25.08 kg/m. GEN: Somewhat frail elderly WF, in no acute distress  HEENT: normocephalic, atraumatic Neck: no JVD, carotid bruits, or masses Cardiac: RRR; no murmurs, rubs, or gallops, pale puffy 1+ BLE, mild hand edema. Respiratory:  clear to auscultation bilaterally, normal work of breathing GI: soft, nontender, nondistended, + BS MS: no deformity or atrophy  Skin: warm and dry, no rash Neuro:  Alert and Oriented x 3, Strength and sensation are intact, follows commands Psych: euthymic mood, full affect  Wt Readings from Last 3 Encounters:  07/06/16 120 lb (  54.4 kg)  06/17/16 115 lb 8 oz (52.4 kg)  06/10/16 119 lb (54 kg)      Studies/Labs Reviewed:   EKG:  EKG was ordered today and personally reviewed by me and demonstrates NSR LBBB  Recent Labs: 06/12/2016: ALT 22; B Natriuretic Peptide 2,185.0; TSH 1.156 06/14/2016: Magnesium 1.8 06/17/2016: BUN 79; Creatinine, Ser 2.41; Hemoglobin 9.5; Platelets 147; Potassium 4.2; Sodium 138   Lipid Panel   Additional studies/ records that were reviewed today include: Summarized above.    ASSESSMENT & PLAN:   1. Acute on chronic diastolic CHF - appears volume overloaded again. Very tenuous situation given all her comorbidities - agree that I am concerned her long-term pronosis is poor. I worry that her renal dysfunction is playing a large role in her volume overload given normal EF. Also has pulm nodule in 05/2016 that needs to be worked up for malignancy per primary. D/w Dr. Elease Hashimoto - change Lasix to Torsemide. Check BMET. If Cr stable would probably need torsemide 20mg  BID but will start with once daily for now until labs are back. Increase hydralazine for improved BP control.  2. Paroxysmal atrial fib - in NSR today. Not a candidate for anticoag at present with recent RPH. F/u Hgb today. 3. Essential HTN - increase  hydralazine to 100mg  TID. I worry her renal disease is driving this picture. Albumin 3.4 so doubt nephrotic syndrome. Will check renal artery duplex to exclude RAS given resistant HTN and worsening renal disease. 4. AKI on CKD stage III-IV - f/u BMET today. Refer to nephrology. 5. AAA - will defer further monitoring to primary cardiologist.  Disposition: F/u with Dr. Elease Hashimoto or APP on a day when Dr. Elease Hashimoto is here in 1 month, sooner if sx do not improve on torsemide. Further workup of pulm nodule per Dr. Juanetta Gosling. She follows up with him tomorrow.  Medication Adjustments/Labs and Tests Ordered: Current medicines are reviewed at length with the patient today.  Concerns regarding medicines are outlined above. Medication changes, Labs and Tests ordered today are summarized above and listed in the Patient Instructions accessible in Encounters.   Thomasene Mohair PA-C  07/06/2016 10:31 AM    [facility] [address] [place], [place], [place]  [address] Phone: [phone]; Fax: [phone]

## 2016-07-06 ENCOUNTER — Encounter (INDEPENDENT_AMBULATORY_CARE_PROVIDER_SITE_OTHER): Payer: Self-pay

## 2016-07-06 ENCOUNTER — Ambulatory Visit (INDEPENDENT_AMBULATORY_CARE_PROVIDER_SITE_OTHER): Payer: Commercial Managed Care - HMO | Admitting: Physician Assistant

## 2016-07-06 ENCOUNTER — Encounter: Payer: Self-pay | Admitting: Physician Assistant

## 2016-07-06 ENCOUNTER — Other Ambulatory Visit: Payer: Self-pay | Admitting: Physician Assistant

## 2016-07-06 VITALS — BP 180/80 | HR 74 | Ht <= 58 in | Wt 120.0 lb

## 2016-07-06 DIAGNOSIS — N189 Chronic kidney disease, unspecified: Secondary | ICD-10-CM

## 2016-07-06 DIAGNOSIS — I10 Essential (primary) hypertension: Secondary | ICD-10-CM

## 2016-07-06 DIAGNOSIS — N179 Acute kidney failure, unspecified: Secondary | ICD-10-CM

## 2016-07-06 DIAGNOSIS — I480 Paroxysmal atrial fibrillation: Secondary | ICD-10-CM

## 2016-07-06 DIAGNOSIS — I5033 Acute on chronic diastolic (congestive) heart failure: Secondary | ICD-10-CM

## 2016-07-06 DIAGNOSIS — I714 Abdominal aortic aneurysm, without rupture, unspecified: Secondary | ICD-10-CM

## 2016-07-06 LAB — CBC WITH DIFFERENTIAL/PLATELET
BASOS ABS: 0 {cells}/uL (ref 0–200)
BASOS PCT: 0 %
EOS ABS: 100 {cells}/uL (ref 15–500)
EOS PCT: 1 %
HCT: 30.7 % — ABNORMAL LOW (ref 35.0–45.0)
Hemoglobin: 10 g/dL — ABNORMAL LOW (ref 11.7–15.5)
LYMPHS PCT: 5 %
Lymphs Abs: 500 cells/uL — ABNORMAL LOW (ref 850–3900)
MCH: 27.9 pg (ref 27.0–33.0)
MCHC: 32.6 g/dL (ref 32.0–36.0)
MCV: 85.5 fL (ref 80.0–100.0)
MONOS PCT: 4 %
MPV: 9.1 fL (ref 7.5–12.5)
Monocytes Absolute: 400 cells/uL (ref 200–950)
NEUTROS ABS: 9000 {cells}/uL — AB (ref 1500–7800)
Neutrophils Relative %: 90 %
PLATELETS: 172 10*3/uL (ref 140–400)
RBC: 3.59 MIL/uL — ABNORMAL LOW (ref 3.80–5.10)
RDW: 16.4 % — AB (ref 11.0–15.0)
WBC: 10 10*3/uL (ref 3.8–10.8)

## 2016-07-06 LAB — BASIC METABOLIC PANEL
BUN: 50 mg/dL — AB (ref 7–25)
CALCIUM: 8.2 mg/dL — AB (ref 8.6–10.4)
CHLORIDE: 101 mmol/L (ref 98–110)
CO2: 28 mmol/L (ref 20–31)
CREATININE: 1.77 mg/dL — AB (ref 0.60–0.93)
Glucose, Bld: 141 mg/dL — ABNORMAL HIGH (ref 65–99)
Potassium: 3.8 mmol/L (ref 3.5–5.3)
Sodium: 145 mmol/L (ref 135–146)

## 2016-07-06 MED ORDER — TORSEMIDE 20 MG PO TABS: 20.0000 mg | ORAL_TABLET | Freq: Every day | ORAL | 3 refills | Status: DC

## 2016-07-06 MED ORDER — HYDRALAZINE HCL 100 MG PO TABS: 100.0000 mg | ORAL_TABLET | Freq: Three times a day (TID) | ORAL | 3 refills | Status: DC

## 2016-07-06 MED ORDER — METOPROLOL TARTRATE 50 MG PO TABS: 50.0000 mg | ORAL_TABLET | Freq: Two times a day (BID) | ORAL | 11 refills | Status: DC

## 2016-07-06 NOTE — Patient Instructions (Addendum)
**Note De-Identified Bertrand Vowels Obfuscation** Medication Instructions:  Stop taking Furosemide Increase Hydralazine to 100 mg three times a day Start taking Torsemide 20 mg daily  Labwork: Today-BMET and CBCD  Testing/Procedures: Your physician has requested that you have a renal artery duplex. During this test, an ultrasound is used to evaluate blood flow to the kidneys. Allow one hour for this exam. Do not eat after midnight the day before and avoid carbonated beverages. Take your medications as you usually do.  Follow-Up: In 1 month with Dr Elease HashimotoNahser or an APP on day that Dr Elease HashimotoNahser is here  Any Other Special Instructions Will Be Listed Below (If Applicable). You have been referred to Nephrology for chronic Kidney disease.      If you need a refill on your cardiac medications before your next appointment, please call your pharmacy.

## 2016-07-07 ENCOUNTER — Telehealth: Payer: Self-pay | Admitting: *Deleted

## 2016-07-07 DIAGNOSIS — I5033 Acute on chronic diastolic (congestive) heart failure: Secondary | ICD-10-CM

## 2016-07-07 MED ORDER — TORSEMIDE 20 MG PO TABS: 20.0000 mg | ORAL_TABLET | Freq: Two times a day (BID) | ORAL | 3 refills | Status: DC

## 2016-07-07 NOTE — Telephone Encounter (Signed)
Pt has been made aware of her lab results. She has been advised to take Torsemide 20 mg bid instead qd and come in for f/u 07/15/16 with KT, PA-C with repeat bmet. Pt agreeable with this plan. Orders in EPIC.

## 2016-07-07 NOTE — Telephone Encounter (Signed)
-----   Message from Laurann Montanaayna N Dunn, [place] sent at 07/06/2016  8:10 PM EDT ----- Please let patient know creatinine has improved but still high. Still plan to keep referral to nephrology. I had discussed her case with Dr. Elease HashimotoNahser earlier and the plan was to go for torsemide 20mg  BID if kidney failure had not progressed. (Lasix was stopped today and was changed to Torsemide - I started with once daily to be on the safe side but OK to go ahead and have her take 1 tablet BID). Based on these labs and the change in med, I would actually recommend f/u appt in 1 week with a BMET instead of 1 month since we are going to increase the medicine. Dayna Dunn PA-C

## 2016-07-10 ENCOUNTER — Emergency Department (HOSPITAL_COMMUNITY): Payer: Commercial Managed Care - HMO

## 2016-07-10 ENCOUNTER — Encounter (HOSPITAL_COMMUNITY): Payer: Self-pay | Admitting: *Deleted

## 2016-07-10 ENCOUNTER — Inpatient Hospital Stay (HOSPITAL_COMMUNITY)
Admission: EM | Admit: 2016-07-10 | Discharge: 2016-07-27 | DRG: 291 | Disposition: A | Discharge status: Hospice | Payer: Commercial Managed Care - HMO | Attending: Family Medicine | Admitting: Family Medicine

## 2016-07-10 DIAGNOSIS — E785 Hyperlipidemia, unspecified: Secondary | ICD-10-CM | POA: Diagnosis present

## 2016-07-10 DIAGNOSIS — I2510 Atherosclerotic heart disease of native coronary artery without angina pectoris: Secondary | ICD-10-CM | POA: Diagnosis present

## 2016-07-10 DIAGNOSIS — J9610 Chronic respiratory failure, unspecified whether with hypoxia or hypercapnia: Secondary | ICD-10-CM | POA: Diagnosis present

## 2016-07-10 DIAGNOSIS — R739 Hyperglycemia, unspecified: Secondary | ICD-10-CM | POA: Diagnosis present

## 2016-07-10 DIAGNOSIS — I10 Essential (primary) hypertension: Secondary | ICD-10-CM | POA: Diagnosis not present

## 2016-07-10 DIAGNOSIS — F419 Anxiety disorder, unspecified: Secondary | ICD-10-CM | POA: Diagnosis not present

## 2016-07-10 DIAGNOSIS — R911 Solitary pulmonary nodule: Secondary | ICD-10-CM | POA: Diagnosis present

## 2016-07-10 DIAGNOSIS — J9600 Acute respiratory failure, unspecified whether with hypoxia or hypercapnia: Secondary | ICD-10-CM | POA: Diagnosis not present

## 2016-07-10 DIAGNOSIS — K661 Hemoperitoneum: Secondary | ICD-10-CM | POA: Diagnosis present

## 2016-07-10 DIAGNOSIS — Z9981 Dependence on supplemental oxygen: Secondary | ICD-10-CM | POA: Diagnosis not present

## 2016-07-10 DIAGNOSIS — Z452 Encounter for adjustment and management of vascular access device: Secondary | ICD-10-CM

## 2016-07-10 DIAGNOSIS — J9621 Acute and chronic respiratory failure with hypoxia: Secondary | ICD-10-CM | POA: Diagnosis present

## 2016-07-10 DIAGNOSIS — Z87891 Personal history of nicotine dependence: Secondary | ICD-10-CM | POA: Diagnosis not present

## 2016-07-10 DIAGNOSIS — I248 Other forms of acute ischemic heart disease: Secondary | ICD-10-CM | POA: Diagnosis present

## 2016-07-10 DIAGNOSIS — Z66 Do not resuscitate: Secondary | ICD-10-CM | POA: Diagnosis not present

## 2016-07-10 DIAGNOSIS — J810 Acute pulmonary edema: Secondary | ICD-10-CM

## 2016-07-10 DIAGNOSIS — R7989 Other specified abnormal findings of blood chemistry: Secondary | ICD-10-CM | POA: Diagnosis not present

## 2016-07-10 DIAGNOSIS — E871 Hypo-osmolality and hyponatremia: Secondary | ICD-10-CM | POA: Diagnosis present

## 2016-07-10 DIAGNOSIS — G9341 Metabolic encephalopathy: Secondary | ICD-10-CM | POA: Diagnosis not present

## 2016-07-10 DIAGNOSIS — I714 Abdominal aortic aneurysm, without rupture, unspecified: Secondary | ICD-10-CM | POA: Diagnosis present

## 2016-07-10 DIAGNOSIS — N179 Acute kidney failure, unspecified: Secondary | ICD-10-CM | POA: Diagnosis present

## 2016-07-10 DIAGNOSIS — I480 Paroxysmal atrial fibrillation: Secondary | ICD-10-CM | POA: Diagnosis present

## 2016-07-10 DIAGNOSIS — J9601 Acute respiratory failure with hypoxia: Secondary | ICD-10-CM | POA: Diagnosis not present

## 2016-07-10 DIAGNOSIS — N184 Chronic kidney disease, stage 4 (severe): Secondary | ICD-10-CM | POA: Diagnosis present

## 2016-07-10 DIAGNOSIS — J441 Chronic obstructive pulmonary disease with (acute) exacerbation: Secondary | ICD-10-CM | POA: Diagnosis present

## 2016-07-10 DIAGNOSIS — I130 Hypertensive heart and chronic kidney disease with heart failure and stage 1 through stage 4 chronic kidney disease, or unspecified chronic kidney disease: Principal | ICD-10-CM | POA: Diagnosis present

## 2016-07-10 DIAGNOSIS — I5033 Acute on chronic diastolic (congestive) heart failure: Secondary | ICD-10-CM | POA: Diagnosis present

## 2016-07-10 DIAGNOSIS — Z8673 Personal history of transient ischemic attack (TIA), and cerebral infarction without residual deficits: Secondary | ICD-10-CM | POA: Diagnosis not present

## 2016-07-10 DIAGNOSIS — Z515 Encounter for palliative care: Secondary | ICD-10-CM | POA: Diagnosis not present

## 2016-07-10 DIAGNOSIS — D649 Anemia, unspecified: Secondary | ICD-10-CM

## 2016-07-10 DIAGNOSIS — J410 Simple chronic bronchitis: Secondary | ICD-10-CM | POA: Diagnosis not present

## 2016-07-10 DIAGNOSIS — R778 Other specified abnormalities of plasma proteins: Secondary | ICD-10-CM | POA: Diagnosis present

## 2016-07-10 DIAGNOSIS — E876 Hypokalemia: Secondary | ICD-10-CM | POA: Diagnosis present

## 2016-07-10 DIAGNOSIS — Z4659 Encounter for fitting and adjustment of other gastrointestinal appliance and device: Secondary | ICD-10-CM

## 2016-07-10 DIAGNOSIS — R748 Abnormal levels of other serum enzymes: Secondary | ICD-10-CM | POA: Diagnosis present

## 2016-07-10 DIAGNOSIS — I4891 Unspecified atrial fibrillation: Secondary | ICD-10-CM | POA: Diagnosis not present

## 2016-07-10 DIAGNOSIS — D6489 Other specified anemias: Secondary | ICD-10-CM | POA: Diagnosis present

## 2016-07-10 DIAGNOSIS — R0600 Dyspnea, unspecified: Secondary | ICD-10-CM

## 2016-07-10 DIAGNOSIS — J449 Chronic obstructive pulmonary disease, unspecified: Secondary | ICD-10-CM | POA: Diagnosis present

## 2016-07-10 DIAGNOSIS — Z01818 Encounter for other preprocedural examination: Secondary | ICD-10-CM

## 2016-07-10 DIAGNOSIS — Z9289 Personal history of other medical treatment: Secondary | ICD-10-CM

## 2016-07-10 DIAGNOSIS — I340 Nonrheumatic mitral (valve) insufficiency: Secondary | ICD-10-CM | POA: Diagnosis present

## 2016-07-10 DIAGNOSIS — R0602 Shortness of breath: Secondary | ICD-10-CM | POA: Diagnosis present

## 2016-07-10 DIAGNOSIS — N189 Chronic kidney disease, unspecified: Secondary | ICD-10-CM

## 2016-07-10 DIAGNOSIS — I509 Heart failure, unspecified: Secondary | ICD-10-CM | POA: Insufficient documentation

## 2016-07-10 DIAGNOSIS — J42 Unspecified chronic bronchitis: Secondary | ICD-10-CM | POA: Diagnosis not present

## 2016-07-10 LAB — COMPREHENSIVE METABOLIC PANEL
ALK PHOS: 51 U/L (ref 38–126)
ALT: 11 U/L — ABNORMAL LOW (ref 14–54)
ANION GAP: 13 (ref 5–15)
AST: 12 U/L — ABNORMAL LOW (ref 15–41)
Albumin: 3.2 g/dL — ABNORMAL LOW (ref 3.5–5.0)
BILIRUBIN TOTAL: 0.8 mg/dL (ref 0.3–1.2)
BUN: 58 mg/dL — ABNORMAL HIGH (ref 6–20)
CALCIUM: 8.1 mg/dL — AB (ref 8.9–10.3)
CO2: 28 mmol/L (ref 22–32)
Chloride: 100 mmol/L — ABNORMAL LOW (ref 101–111)
Creatinine, Ser: 2.25 mg/dL — ABNORMAL HIGH (ref 0.44–1.00)
GFR calc non Af Amer: 20 mL/min — ABNORMAL LOW (ref >60)
GFR, EST AFRICAN AMERICAN: 23 mL/min — AB (ref >60)
Glucose, Bld: 130 mg/dL — ABNORMAL HIGH (ref 65–99)
Potassium: 2.8 mmol/L — ABNORMAL LOW (ref 3.5–5.1)
SODIUM: 141 mmol/L (ref 135–145)
TOTAL PROTEIN: 5.6 g/dL — AB (ref 6.5–8.1)

## 2016-07-10 LAB — BLOOD GAS, ARTERIAL
Acid-Base Excess: 7.4 mmol/L — ABNORMAL HIGH (ref 0.0–2.0)
Bicarbonate: 30.9 mmol/L — ABNORMAL HIGH (ref 20.0–28.0)
DELIVERY SYSTEMS: POSITIVE
DRAWN BY: 22223
Expiratory PAP: 5
FIO2: 50
Inspiratory PAP: 12
LHR: 12 {breaths}/min
O2 Saturation: 99.4 %
TCO2: 15.4 mmol/L (ref 0–100)
pCO2 arterial: 46.1 mmHg (ref 32.0–48.0)
pH, Arterial: 7.45 (ref 7.350–7.450)
pO2, Arterial: 144 mmHg — ABNORMAL HIGH (ref 83.0–108.0)

## 2016-07-10 LAB — URINALYSIS, ROUTINE W REFLEX MICROSCOPIC
Bilirubin Urine: NEGATIVE
Glucose, UA: NEGATIVE mg/dL
HGB URINE DIPSTICK: NEGATIVE
Ketones, ur: NEGATIVE mg/dL
LEUKOCYTES UA: NEGATIVE
Nitrite: NEGATIVE
PROTEIN: NEGATIVE mg/dL
Specific Gravity, Urine: 1.01 (ref 1.005–1.030)
pH: 5.5 (ref 5.0–8.0)

## 2016-07-10 LAB — I-STAT CG4 LACTIC ACID, ED
LACTIC ACID, VENOUS: 0.84 mmol/L (ref 0.5–1.9)
LACTIC ACID, VENOUS: 0.51 mmol/L (ref 0.5–1.9)

## 2016-07-10 LAB — CBC WITH DIFFERENTIAL/PLATELET
Basophils Absolute: 0.1 10*3/uL (ref 0.0–0.1)
Basophils Relative: 1 %
EOS ABS: 0.2 10*3/uL (ref 0.0–0.7)
Eosinophils Relative: 3 %
HEMATOCRIT: 28.9 % — AB (ref 36.0–46.0)
HEMOGLOBIN: 9.3 g/dL — AB (ref 12.0–15.0)
LYMPHS ABS: 0.7 10*3/uL (ref 0.7–4.0)
Lymphocytes Relative: 9 %
MCH: 28.4 pg (ref 26.0–34.0)
MCHC: 32.2 g/dL (ref 30.0–36.0)
MCV: 88.4 fL (ref 78.0–100.0)
MONO ABS: 0.6 10*3/uL (ref 0.1–1.0)
MONOS PCT: 9 %
NEUTROS PCT: 78 %
Neutro Abs: 5.8 10*3/uL (ref 1.7–7.7)
Platelets: 205 10*3/uL (ref 150–400)
RBC: 3.27 MIL/uL — ABNORMAL LOW (ref 3.87–5.11)
RDW: 16 % — AB (ref 11.5–15.5)
WBC: 7.4 10*3/uL (ref 4.0–10.5)

## 2016-07-10 LAB — BRAIN NATRIURETIC PEPTIDE: B NATRIURETIC PEPTIDE 5: 1649 pg/mL — AB (ref 0.0–100.0)

## 2016-07-10 LAB — PROTIME-INR
INR: 1.04
PROTHROMBIN TIME: 13.6 s (ref 11.4–15.2)

## 2016-07-10 LAB — TROPONIN I: Troponin I: 0.03 ng/mL (ref <0.03)

## 2016-07-10 MED ORDER — NITROGLYCERIN IN D5W 200-5 MCG/ML-% IV SOLN
5.0000 ug/min | Freq: Once | INTRAVENOUS | Status: AC
  Administered 2016-07-10: 5 ug/min via INTRAVENOUS
  Filled 2016-07-10: qty 250

## 2016-07-10 MED ORDER — POTASSIUM CHLORIDE CRYS ER 20 MEQ PO TBCR
20.0000 meq | EXTENDED_RELEASE_TABLET | Freq: Two times a day (BID) | ORAL | Status: DC
  Administered 2016-07-10 – 2016-07-11 (×2): 20 meq via ORAL
  Filled 2016-07-10: qty 1

## 2016-07-10 MED ORDER — FUROSEMIDE 10 MG/ML IJ SOLN
40.0000 mg | Freq: Once | INTRAMUSCULAR | Status: AC
  Administered 2016-07-10: 40 mg via INTRAVENOUS
  Filled 2016-07-10: qty 4

## 2016-07-10 MED ORDER — POTASSIUM CHLORIDE CRYS ER 20 MEQ PO TBCR
EXTENDED_RELEASE_TABLET | ORAL | Status: AC
  Filled 2016-07-10: qty 1

## 2016-07-10 NOTE — ED Provider Notes (Signed)
[facility] DEPT Provider Note   CSN: [id number] Arrival date & time: 07/10/16  1919     History   Chief Complaint Chief Complaint  Patient presents with  . Shortness of Breath    HPI Sydney Jacobs is a 82 y.o. female.  Level V caveat for respiratory distress. Patient arrives from home with increased leg swelling and shortness of breath over the past day. Family states she's been having shortness of breath for the past 2 months but became acutely worse today. Her diuretics were recently switched from Lasix to torsemide 3 days ago. Patient denies any chest pain. She has a nonproductive cough. No fever. States compliance with her medications. Denies any change in her weight. Patient with known history of COPD and home oxygen 4 L. Also has a history of paroxysmal atrial fibrillation, CHF, CK D. Appears her diuretics were switched to her kidney function. She had nonobstructive disease by Last month. Her legs have increased swelling as well.   The history is provided by the patient and the spouse. The history is limited by the condition of the patient.  Shortness of Breath  Associated symptoms include cough and leg swelling. Pertinent negatives include no headaches, no chest pain, no vomiting, no abdominal pain and no rash.    Past Medical History:  Diagnosis Date  . AAA (abdominal aortic aneurysm) (HCC)    a. 4.4cm by CT abd 05/29/16.  Marland Kitchen. Anemia, unspecified   . Chronic diastolic CHF (congestive heart failure) (HCC)   . Chronic respiratory failure (HCC)   . CKD (chronic kidney disease) stage 3, GFR 30-59 ml/min   . COPD (chronic obstructive pulmonary disease) (HCC)   . Essential hypertension   . History of stroke   . Hyperlipidemia   . Mild CAD    a. LHC 05/2016 - 25% pRCA, 25% ramus, and mildly elevated LVEDP. c/b post-cath RPH.  . On home oxygen therapy   . PAF (paroxysmal atrial fibrillation) (HCC) 06/2016   a. dx 06/2016 - not a candidate for anticoag given recent  retroperitoneal hematoma.  . Pulmonary nodule    Left upper lobe 1 cm pulmonary nodule - chest CT July 2017  . Retroperitoneal hematoma    a. after cath 05/2016 requiring txfusion.    Patient Active Problem List   Diagnosis Date Noted  . AAA (abdominal aortic aneurysm) (HCC)   . Anemia, unspecified   . Chronic diastolic CHF (congestive heart failure) (HCC)   . Chronic respiratory failure (HCC)   . CKD (chronic kidney disease) stage 3, GFR 30-59 ml/min   . Mild CAD   . PAF (paroxysmal atrial fibrillation) (HCC) 06/16/2016  . Palliative care encounter   . Goals of care, counseling/discussion   . Demand ischemia (HCC) 06/14/2016  . Retroperitoneal hematoma 06/10/2016  . Pressure ulcer 05/29/2016  . Anemia   . Acute on chronic diastolic CHF (congestive heart failure) (HCC) 05/23/2016  . COPD (chronic obstructive pulmonary disease) (HCC) 05/23/2016  . AKI (acute kidney injury) (HCC) 05/11/2016  . Accelerated hypertension 05/11/2016  . Essential hypertension   . Lung nodule   . Chronic kidney disease (CKD), stage IV (severe) (HCC) 05/09/2016  . Tick bite 05/08/2016  . Elevated troponin 05/08/2016    Past Surgical History:  Procedure Laterality Date  . ABDOMINAL HYSTERECTOMY    . APPENDECTOMY    . CARDIAC CATHETERIZATION N/A 05/26/2016   Procedure: Left Heart Cath and Coronary Angiography;  Surgeon: Corky CraftsJayadeep S Varanasi, MD;  [facility];  Service: Cardiovascular;  Laterality: N/A;  . CAROTID ENDARTERECTOMY Left   . HEMORRHOID SURGERY    . PARTIAL HIP ARTHROPLASTY    . TONSILLECTOMY      OB History    No data available       Home Medications    Prior to Admission medications   Medication Sig Start Date End Date Taking? Authorizing Provider  albuterol (PROVENTIL HFA;VENTOLIN HFA) 108 (90 Base) MCG/ACT inhaler Inhale 2 puffs into the lungs every 6 (six) hours as needed for wheezing or shortness of breath.    Historical Provider, MD  amLODipine (NORVASC) 10  MG tablet Take 1 tablet (10 mg total) by mouth daily. 05/13/16   Standley Brooking, MD  atorvastatin (LIPITOR) 20 MG tablet Take 1 tablet (20 mg total) by mouth daily at 6 PM. 06/03/16   Maryann Mikhail, DO  cetirizine (ZYRTEC) 10 MG tablet Take 10 mg by mouth daily.    Historical Provider, MD  hydrALAZINE (APRESOLINE) 100 MG tablet Take 1 tablet (100 mg total) by mouth 3 (three) times daily. 07/06/16   Dayna N Dunn, PA-C  ipratropium (ATROVENT) 0.02 % nebulizer solution Take 2.5 mLs (0.5 mg total) by nebulization 3 (three) times daily. 06/17/16   Henderson Cloud, MD  levalbuterol (XOPENEX) 0.63 MG/3ML nebulizer solution Take 3 mLs (0.63 mg total) by nebulization 3 (three) times daily. 06/17/16   Henderson Cloud, MD  levofloxacin (LEVAQUIN) 500 MG tablet Take 1 tablet (500 mg total) by mouth daily. 06/17/16   Henderson Cloud, MD  metoprolol (LOPRESSOR) 50 MG tablet Take 1 tablet (50 mg total) by mouth 2 (two) times daily. 07/06/16   Dayna N Dunn, PA-C  pantoprazole (PROTONIX) 40 MG tablet Take 1 tablet (40 mg total) by mouth daily. 06/03/16   Maryann Mikhail, DO  predniSONE (DELTASONE) 10 MG tablet Take 1 tablet (10 mg total) by mouth daily with breakfast. Take 6 tablets today and then decrease by 1 tablet daily until none are left. 06/17/16   Henderson Cloud, MD  torsemide (DEMADEX) 20 MG tablet Take 1 tablet (20 mg total) by mouth 2 (two) times daily. 07/07/16 10/05/16  Dayna N Dunn, PA-C  Vitamin D, Ergocalciferol, (DRISDOL) 50000 units CAPS capsule Take 1 capsule by mouth once a week. Takes on Fridays. 03/23/16   Historical Provider, MD    Family History Family History  Problem Relation Age of Onset  . Hypertension Mother   . Hypertension Father     Social History Social History  Substance Use Topics  . Smoking status: Former Smoker    Types: Cigarettes  . Smokeless tobacco: Never Used  . Alcohol use No     Allergies   Sulfa antibiotics   Review of  Systems Review of Systems  Constitutional: Positive for activity change and appetite change. Negative for fatigue.  HENT: Negative for congestion, nosebleeds and postnasal drip.   Eyes: Negative for visual disturbance.  Respiratory: Positive for cough, chest tightness and shortness of breath.   Cardiovascular: Positive for leg swelling. Negative for chest pain.  Gastrointestinal: Negative for abdominal pain, nausea and vomiting.  Genitourinary: Negative for dysuria, hematuria, vaginal bleeding and vaginal discharge.  Musculoskeletal: Negative for arthralgias and myalgias.  Skin: Negative for rash.  Neurological: Negative for dizziness, weakness, light-headedness and headaches.   A complete 10 system review of systems was obtained and all systems are negative except as noted in the HPI and PMH.    Physical Exam Updated Vital  Signs BP 179/71   Pulse 92   Temp 98 F (36.7 C) (Oral)   Resp (!) 32   Ht 4\' 10"  (1.473 m)   Wt 114 lb (51.7 kg)   SpO2 95%   BMI 23.83 kg/m   Physical Exam  Constitutional: She is oriented to person, place, and time. She appears well-developed. She appears distressed.  Ill-appearing, increased work of breathing, dyspneic with conversation, speaking in short phrases  HENT:  Head: Normocephalic and atraumatic.  Mouth/Throat: Oropharynx is clear and moist. No oropharyngeal exudate.  Eyes: Conjunctivae and EOM are normal. Pupils are equal, round, and reactive to light.  Neck: Normal range of motion. Neck supple.  No meningismus.  Cardiovascular: Normal rate, regular rhythm, normal heart sounds and intact distal pulses.   No murmur heard. Pulmonary/Chest: She is in respiratory distress. She has no wheezes. She has rales.  Scattered rhonchi throughout, decreased at the bases  Abdominal: Soft. There is no tenderness. There is no rebound and no guarding.  Musculoskeletal: Normal range of motion. She exhibits edema. She exhibits no tenderness.  +2 edema to  knees  Neurological: She is alert and oriented to person, place, and time. No cranial nerve deficit. She exhibits normal muscle tone. Coordination normal.   5/5 strength throughout. CN 2-12 intact.Equal grip strength.   Skin: Skin is warm.  Psychiatric: She has a normal mood and affect. Her behavior is normal.  Nursing note and vitals reviewed.    ED Treatments / Results  Labs (all labs ordered are listed, but only abnormal results are displayed) Labs Reviewed  CBC WITH DIFFERENTIAL/PLATELET - Abnormal; Notable for the following:       Result Value   RBC 3.27 (*)    Hemoglobin 9.3 (*)    HCT 28.9 (*)    RDW 16.0 (*)    All other components within normal limits  COMPREHENSIVE METABOLIC PANEL - Abnormal; Notable for the following:    Potassium 2.8 (*)    Chloride 100 (*)    Glucose, Bld 130 (*)    BUN 58 (*)    Creatinine, Ser 2.25 (*)    Calcium 8.1 (*)    Total Protein 5.6 (*)    Albumin 3.2 (*)    AST 12 (*)    ALT 11 (*)    GFR calc non Af Amer 20 (*)    GFR calc Af Amer 23 (*)    All other components within normal limits  TROPONIN I - Abnormal; Notable for the following:    Troponin I 0.03 (*)    All other components within normal limits  BRAIN NATRIURETIC PEPTIDE - Abnormal; Notable for the following:    B Natriuretic Peptide 1,649.0 (*)    All other components within normal limits  BLOOD GAS, ARTERIAL - Abnormal; Notable for the following:    pO2, Arterial 144 (*)    Bicarbonate 30.9 (*)    Acid-Base Excess 7.4 (*)    All other components within normal limits  PROTIME-INR  URINALYSIS, ROUTINE W REFLEX MICROSCOPIC (NOT AT [facility])  PROCALCITONIN  I-STAT CG4 LACTIC ACID, ED  I-STAT CG4 LACTIC ACID, ED    EKG  EKG Interpretation  Date/Time:  Sunday July 10 2016 19:28:43 EDT Ventricular Rate:  97 PR Interval:    QRS Duration: 112 QT Interval:  402 QTC Calculation: 498 R Axis:   -44 Text Interpretation:  Sinus rhythm Atrial premature complexes  Borderline IVCD with LAD Anterior infarct, old Abnormal T, consider ischemia, lateral leads  Artifact in lead(s) I III aVR aVL aVF V2 V5 V6 Artifact No significant change was found Confirmed by Manus Gunning  MD, Dirk Vanaman [phone]) on 07/10/2016 7:42:57 PM       Radiology Dg Chest Portable 1 View  Result Date: 07/10/2016 CLINICAL DATA:  Shortness of breath and bilateral lower extremity edema. EXAM: PORTABLE CHEST 1 VIEW COMPARISON:  Single-view of the chest 06/14/2016 and 06/12/2016. FINDINGS: Bilateral airspace disease seen on the most recent examination has worsened. Small to moderate left pleural effusion is unchanged. There is a small right pleural effusion which appears increased. Atherosclerosis is noted. No pneumothorax. IMPRESSION: Worsened bilateral airspace disease compatible with increased pulmonary edema and/or pneumonia. New small right pleural effusion. Small to moderate left pleural effusion is unchanged since the most recent study. Atherosclerosis. Electronically Signed   By: Drusilla Kanner M.D.   On: 07/10/2016 20:09    Procedures Procedures (including critical care time)  Medications Ordered in ED Medications  furosemide (LASIX) injection 40 mg (40 mg Intravenous Given 07/10/16 1942)     Initial Impression / Assessment and Plan / ED Course  I have reviewed the triage vital signs and the nursing notes.  Pertinent labs & imaging results that were available during my care of the patient were reviewed by me and considered in my medical decision making (see chart for details).  Clinical Course  patient with history of CHF, COPD presenting with increased shortness of breath and respiratory distress. Denies chest pain. Recent switch of diuretics 3 days ago.  Patient placed on BiPAP on arrival to given IV Lasix. Labs will be obtained.EKG without acute change. Most recent echocardiogram shows EF of 65% with grade 2 diastolic dysfunction  Chest x-ray shows bilateral airspace disease or  pleural effusions. ABG shows no CO2 retention. Patient given IV Lasix and started on IV nitroglycerin. She states she is more comfortable on BiPAP.  Patient's renal function is slightly worse. This will need to be watched closely as she is diuresed. She was comfortable on BiPAP with speaking in full sentences.  Admission discussed with Dr. Onalee Hua. There is no cardiology service at any time of the holiday weekend so she prefers patient to be transferred to [facility]. Family in agreement.  CRITICAL CARE Performed by: Glynn Octave Total critical care time: 40 minutes Critical care time was exclusive of separately billable procedures and treating other patients. Critical care was necessary to treat or prevent imminent or life-threatening deterioration. Critical care was time spent personally by me on the following activities: development of treatment plan with patient and/or surrogate as well as nursing, discussions with consultants, evaluation of patient's response to treatment, examination of patient, obtaining history from patient or surrogate, ordering and performing treatments and interventions, ordering and review of laboratory studies, ordering and review of radiographic studies, pulse oximetry and re-evaluation of patient's condition.   Final Clinical Impressions(s) / ED Diagnoses   Final diagnoses:  Acute on chronic diastolic congestive heart failure [facility])    New Prescriptions New Prescriptions   No medications on file     Glynn Octave, MD 07/11/16 0023

## 2016-07-10 NOTE — ED Triage Notes (Signed)
Pt c/o bilateral lower extremity swelling and sob that became worse today,

## 2016-07-10 NOTE — ED Notes (Signed)
Report to Ganandahanell, RCharity fundraiser

## 2016-07-10 NOTE — ED Notes (Signed)
Pt resting,eyes closed awaiting transport- Family at bedside-

## 2016-07-10 NOTE — ED Notes (Signed)
[facility] Chanell, RN will call back- Awaiting transport to [facility]

## 2016-07-10 NOTE — H&P (Signed)
History and Physical    Sydney Gawlsie H Kantor ZOX:[id number] DOB: 1933-11-10 DOA: 07/10/2016  PCP: Colette RibasGOLDING, JOHN CABOT, MD  Patient coming from: home  Chief Complaint:  Sob and swelling  HPI: Sydney Jacobs is a 82 y.o. female with medical history significant of diastolic CHF, COPD, CRF on 4 liters oxygen at home, AAA, retroperitoneal bleed post cath in 8/17, PAF, CKD with 3 hospitalizations in the last couple of months comes in with one day of sudden worsening sob.  Pt and husband provide history.  Pt saw her cardiology office Friday and her diuretics were changes from lasix to toresemide.  She was noted to be volume overloaded during that visit.  Husband says during that visit he was told "her heart was strong".  She has had no fevers.  She has had worse leg swelling bilaterally and today got more sob than usual.  She has had no n/v/d.  No pain anywhere.  On arrival to ED pt was in moderate respiratory distress and appeared volume overloaded was placed on bipap and given lasix 40mg  iv.  Pt seems to have improved on bipap over the last couple of hours.  She can speak in sentences, denies pain.  Pt has been referred for admission for acute chf exacerbation.  Of note, [facility]  Overall it appears her husband has poor insight into the severity of her illness.     Review of Systems: As per HPI otherwise 10 point review of systems negative.   Past Medical History:  Diagnosis Date  . AAA (abdominal aortic aneurysm) (HCC)    a. 4.4cm by CT abd 05/29/16.  Marland Kitchen. Anemia, unspecified   . Chronic diastolic CHF (congestive heart failure) (HCC)   . Chronic respiratory failure (HCC)   . CKD (chronic kidney disease) stage 3, GFR 30-59 ml/min   . COPD (chronic obstructive pulmonary disease) (HCC)   . Essential hypertension   . History of stroke   . Hyperlipidemia   . Mild CAD    a. LHC 05/2016 - 25% pRCA, 25% ramus, and mildly elevated LVEDP. c/b  post-cath RPH.  . On home oxygen therapy   . PAF (paroxysmal atrial fibrillation) (HCC) 06/2016   a. dx 06/2016 - not a candidate for anticoag given recent retroperitoneal hematoma.  . Pulmonary nodule    Left upper lobe 1 cm pulmonary nodule - chest CT July 2017  . Retroperitoneal hematoma    a. after cath 05/2016 requiring txfusion.    Past Surgical History:  Procedure Laterality Date  . ABDOMINAL HYSTERECTOMY    . APPENDECTOMY    . CARDIAC CATHETERIZATION N/A 05/26/2016   Procedure: Left Heart Cath and Coronary Angiography;  Surgeon: Corky CraftsJayadeep S Varanasi, MD;  [facility];  Service: Cardiovascular;  Laterality: N/A;  . CAROTID ENDARTERECTOMY Left   . HEMORRHOID SURGERY    . PARTIAL HIP ARTHROPLASTY    . TONSILLECTOMY       reports that she has quit smoking. Her smoking use included Cigarettes. She has never used smokeless tobacco. She reports that she does not drink alcohol or use drugs.  Allergies  Allergen Reactions  . Sulfa Antibiotics Other (See Comments)    unknown    Family History  Problem Relation Age of Onset  . Hypertension Mother   . Hypertension Father     Prior to Admission medications   Medication Sig Start Date End Date Taking? Authorizing Provider  hydrALAZINE (APRESOLINE) 50 MG tablet Take 50 mg by mouth 4 (four) times daily. 06/03/16  Yes Historical Provider, MD  levalbuterol (XOPENEX) 0.63 MG/3ML nebulizer solution Take 3 mLs (0.63 mg total) by nebulization 3 (three) times daily. 06/17/16  Yes Estela Isaiah Blakes, MD  predniSONE (DELTASONE) 10 MG tablet Take 1 tablet (10 mg total) by mouth daily with breakfast. Take 6 tablets today and then decrease by 1 tablet daily until none are left. 06/17/16  Yes Estela Isaiah Blakes, MD  Vitamin D, Ergocalciferol, (DRISDOL) 50000 units CAPS capsule Take 1 capsule by mouth once a week. Takes on Fridays. 03/23/16  Yes Historical Provider, MD  albuterol (PROVENTIL HFA;VENTOLIN HFA) 108 (90 Base)  MCG/ACT inhaler Inhale 2 puffs into the lungs every 6 (six) hours as needed for wheezing or shortness of breath.    Historical Provider, MD  amLODipine (NORVASC) 10 MG tablet Take 1 tablet (10 mg total) by mouth daily. 05/13/16   Standley Brooking, MD  atorvastatin (LIPITOR) 20 MG tablet Take 1 tablet (20 mg total) by mouth daily at 6 PM. 06/03/16   Maryann Mikhail, DO  cetirizine (ZYRTEC) 10 MG tablet Take 10 mg by mouth daily.    Historical Provider, MD  hydrALAZINE (APRESOLINE) 100 MG tablet Take 1 tablet (100 mg total) by mouth 3 (three) times daily. Patient not taking: Reported on 07/10/2016 07/06/16   Dayna N Dunn, PA-C  ipratropium (ATROVENT) 0.02 % nebulizer solution Take 2.5 mLs (0.5 mg total) by nebulization 3 (three) times daily. 06/17/16   Henderson Cloud, MD  levofloxacin (LEVAQUIN) 500 MG tablet Take 1 tablet (500 mg total) by mouth daily. Patient not taking: Reported on 07/10/2016 06/17/16   Henderson Cloud, MD  metoprolol (LOPRESSOR) 50 MG tablet Take 1 tablet (50 mg total) by mouth 2 (two) times daily. 07/06/16   Dayna N Dunn, PA-C  pantoprazole (PROTONIX) 40 MG tablet Take 1 tablet (40 mg total) by mouth daily. 06/03/16   Maryann Mikhail, DO  torsemide (DEMADEX) 20 MG tablet Take 1 tablet (20 mg total) by mouth 2 (two) times daily. 07/07/16 10/05/16  Laurann Montana, PA-C    Physical Exam: Vitals:   07/10/16 2000 07/10/16 2023 07/10/16 2030 07/10/16 2209  BP: 162/81  169/75 167/67  Pulse: 87  86 86  Resp: 23  16 18   Temp:      TempSrc:      SpO2: 99% 98% 98% 98%  Weight:      Height:          Constitutional: NAD, calm, comfortable Vitals:   07/10/16 2000 07/10/16 2023 07/10/16 2030 07/10/16 2209  BP: 162/81  169/75 167/67  Pulse: 87  86 86  Resp: 23  16 18   Temp:      TempSrc:      SpO2: 99% 98% 98% 98%  Weight:      Height:       Eyes: PERRL, lids and conjunctivae normal, comfortable on bipap ENMT: Mucous membranes are moist. Posterior pharynx clear of  any exudate or lesions.Normal dentition.  Neck: normal, supple, no masses, no thyromegaly Respiratory: clear to auscultation bilaterally, no wheezing,  Bilateral crackles noted. Normal respiratory effort. No accessory muscle use.  Cardiovascular: Regular rate and rhythm, no murmurs / rubs / gallops. No extremity edema. 2+ pedal pulses. No carotid bruits.  Abdomen: no tenderness, no masses palpated. No hepatosplenomegaly. Bowel sounds positive.  Musculoskeletal: no clubbing / cyanosis. No joint deformity upper and lower extremities.  Good ROM, no contractures. Normal muscle tone.  Skin: no rashes, lesions, ulcers. No induration  Trace ble edema Neurologic: CN 2-12 grossly intact. Sensation intact, DTR normal. Strength 5/5 in all 4.  Psychiatric: Normal judgment and insight. Alert and oriented x 3. Normal mood.    Labs on Admission: I have personally reviewed following labs and imaging studies  CBC:  Recent Labs Lab 07/06/16 1139 07/10/16 1957  WBC 10.0 7.4  NEUTROABS 9,000* 5.8  HGB 10.0* 9.3*  HCT 30.7* 28.9*  MCV 85.5 88.4  PLT 172 205   Basic Metabolic Panel:  Recent Labs Lab 07/06/16 1139 07/10/16 1957  NA 145 141  K 3.8 2.8*  CL 101 100*  CO2 28 28  GLUCOSE 141* 130*  BUN 50* 58*  CREATININE 1.77* 2.25*  CALCIUM 8.2* 8.1*   GFR: Estimated Creatinine Clearance: 14.5 mL/min (by C-G formula based on SCr of 2.25 mg/dL). Liver Function Tests:  Recent Labs Lab 07/10/16 1957  AST 12*  ALT 11*  ALKPHOS 51  BILITOT 0.8  PROT 5.6*  ALBUMIN 3.2*   Coagulation Profile:  Recent Labs Lab 07/10/16 1957  INR 1.04   Cardiac Enzymes:  Recent Labs Lab 07/10/16 1957  TROPONINI 0.03*   Urine analysis:    Component Value Date/Time   COLORURINE YELLOW 07/10/2016 2005   APPEARANCEUR CLEAR 07/10/2016 2005   LABSPEC 1.010 07/10/2016 2005   PHURINE 5.5 07/10/2016 2005   GLUCOSEU NEGATIVE 07/10/2016 2005   HGBUR NEGATIVE 07/10/2016 2005   BILIRUBINUR NEGATIVE  07/10/2016 2005   KETONESUR NEGATIVE 07/10/2016 2005   PROTEINUR NEGATIVE 07/10/2016 2005   UROBILINOGEN 0.2 04/22/2010 1413   NITRITE NEGATIVE 07/10/2016 2005   LEUKOCYTESUR NEGATIVE 07/10/2016 2005   Radiological Exams on Admission: Dg Chest Portable 1 View  Result Date: 07/10/2016 CLINICAL DATA:  Shortness of breath and bilateral lower extremity edema. EXAM: PORTABLE CHEST 1 VIEW COMPARISON:  Single-view of the chest 06/14/2016 and 06/12/2016. FINDINGS: Bilateral airspace disease seen on the most recent examination has worsened. Small to moderate left pleural effusion is unchanged. There is a small right pleural effusion which appears increased. Atherosclerosis is noted. No pneumothorax. IMPRESSION: Worsened bilateral airspace disease compatible with increased pulmonary edema and/or pneumonia. New small right pleural effusion. Small to moderate left pleural effusion is unchanged since the most recent study. Atherosclerosis. Electronically Signed   By: Drusilla Kanner M.D.   On: 07/10/2016 20:09    EKG: Independently reviewed. nsr , ivcd noted, no change from previous Old chart reviewed Case discussed with husband Case discussed with dr Manus Gunning in ED  Assessment/Plan 82 yo female with acute on chronic respiratory failure secondary to acute on chronic diastolic congestive heart failure  Principal Problem:   Acute on chronic diastolic CHF (congestive heart failure) (HCC)- continue on bipap.  Serial trop.  Continue on ntg drip.  Give lasix 40mg  iv q 12 hours for the next 24 hours, this will need to be closely evaluated and adjusted based on her renal function response to increasing diuretic use.  Will not repeat echo as this has been recently done.    Active Problems:   Chronic kidney disease (CKD), stage IV (severe) (HCC)- noted, this will need to be closely monitored in the setting of increasing her diuresis   Elevated troponin- could be from worsening renal fxn, vs demand ischemia.  Cont  to serial   Essential hypertension- noted   COPD (chronic obstructive pulmonary disease) (HCC)-noted, will continue routine neb treatments, holding off on steroids at  this time, see how she responds to increasing diuretics.  Consider copd component if she does not improve in the next 48 hours   Retroperitoneal hematoma in 8/17- noted, holding anticoagulants for this reason   Chronic respiratory failure (HCC)- on 4 liters at home chronically   PAF (paroxysmal atrial fibrillation) (HCC)- nsr at this time.  noted   AAA (abdominal aortic aneurysm) (HCC)- noted   Have discussed code status with pt and her husband.  Husband wants "everything done no matter what".  Patient disagrees and states she only wants to be on life support for a short term period and does not want prolonged extreme measures.  Pt is full code.  Husband wishes her to be transferred to [place] as we ([place]) have no cardiology coverage here for several days and their services may be needed pending on her response to above treatment.   DVT prophylaxis:  Scds, recent retroperitoneal bleed less than a month ago  Code Status:   full Family Communication:  With husband at bedside Disposition Plan:  Per day team Consults called:  none Admission status:  admit   Nazim Kadlec A MD [facility]  If 7PM-7AM, please contact night-coverage [email or web address] Password [email or web address]  07/10/2016, 10:26 PM

## 2016-07-10 NOTE — ED Notes (Addendum)
Pt difficult draw for Istat lactic acid- Collected by lab- resulted at 2303

## 2016-07-10 NOTE — ED Notes (Signed)
CRITICAL VALUE ALERT  Critical value received:  Troponin 0.03  Date of notification:  07/10/2016  Time of notification:  2128  Critical value read back:Yes.    Nurse who received alert:  Neldon Mcina Zailey Audia RN  MD notified (1st page):  Dr. Manus Gunningancour  Time of first page:  2134  MD notified (2nd page):  Time of second page:  Responding MD:    Time MD responded:

## 2016-07-10 NOTE — ED Notes (Addendum)
Family reports 3rd trip to ED in last several months- previous admission for over a week at [facility] per husband- Has not been breathing well for several months- Today much worse

## 2016-07-10 NOTE — ED Notes (Signed)
[facility]

## 2016-07-11 ENCOUNTER — Inpatient Hospital Stay (HOSPITAL_COMMUNITY): Payer: Commercial Managed Care - HMO

## 2016-07-11 DIAGNOSIS — I480 Paroxysmal atrial fibrillation: Secondary | ICD-10-CM

## 2016-07-11 DIAGNOSIS — I5033 Acute on chronic diastolic (congestive) heart failure: Secondary | ICD-10-CM

## 2016-07-11 LAB — TROPONIN I
TROPONIN I: 0.04 ng/mL — AB (ref <0.03)
TROPONIN I: 0.04 ng/mL — AB (ref <0.03)
Troponin I: 0.04 ng/mL (ref <0.03)

## 2016-07-11 LAB — BASIC METABOLIC PANEL
Anion gap: 9 (ref 5–15)
BUN: 54 mg/dL — ABNORMAL HIGH (ref 6–20)
CHLORIDE: 102 mmol/L (ref 101–111)
CO2: 31 mmol/L (ref 22–32)
CREATININE: 2.21 mg/dL — AB (ref 0.44–1.00)
Calcium: 7.8 mg/dL — ABNORMAL LOW (ref 8.9–10.3)
GFR calc non Af Amer: 20 mL/min — ABNORMAL LOW (ref >60)
GFR, EST AFRICAN AMERICAN: 23 mL/min — AB (ref >60)
Glucose, Bld: 128 mg/dL — ABNORMAL HIGH (ref 65–99)
POTASSIUM: 3 mmol/L — AB (ref 3.5–5.1)
Sodium: 142 mmol/L (ref 135–145)

## 2016-07-11 LAB — POTASSIUM: Potassium: 3.5 mmol/L (ref 3.5–5.1)

## 2016-07-11 LAB — GLUCOSE, CAPILLARY: GLUCOSE-CAPILLARY: 110 mg/dL — AB (ref 65–99)

## 2016-07-11 LAB — MAGNESIUM: Magnesium: 1.3 mg/dL — ABNORMAL LOW (ref 1.7–2.4)

## 2016-07-11 LAB — MRSA PCR SCREENING: MRSA by PCR: NEGATIVE

## 2016-07-11 LAB — PROCALCITONIN: Procalcitonin: 0.13 ng/mL

## 2016-07-11 MED ORDER — SODIUM CHLORIDE 0.9 % IV SOLN
250.0000 mL | INTRAVENOUS | Status: DC | PRN
  Administered 2016-07-12: 250 mL via INTRAVENOUS

## 2016-07-11 MED ORDER — ALBUTEROL SULFATE (2.5 MG/3ML) 0.083% IN NEBU: 2.5000 mg | INHALATION_SOLUTION | Freq: Four times a day (QID) | RESPIRATORY_TRACT | Status: DC | PRN

## 2016-07-11 MED ORDER — HYDRALAZINE HCL 50 MG PO TABS
50.0000 mg | ORAL_TABLET | Freq: Three times a day (TID) | ORAL | Status: DC
  Administered 2016-07-11 (×2): 50 mg via ORAL
  Filled 2016-07-11 (×2): qty 1

## 2016-07-11 MED ORDER — HYDRALAZINE HCL 50 MG PO TABS
75.0000 mg | ORAL_TABLET | Freq: Three times a day (TID) | ORAL | Status: DC
  Administered 2016-07-11: 75 mg via ORAL
  Filled 2016-07-11: qty 1

## 2016-07-11 MED ORDER — METOPROLOL TARTRATE 50 MG PO TABS
75.0000 mg | ORAL_TABLET | Freq: Two times a day (BID) | ORAL | Status: DC
  Administered 2016-07-11: 75 mg via ORAL
  Filled 2016-07-11: qty 1

## 2016-07-11 MED ORDER — CHLORHEXIDINE GLUCONATE 0.12 % MT SOLN
15.0000 mL | Freq: Two times a day (BID) | OROMUCOSAL | Status: DC
  Administered 2016-07-11 – 2016-07-12 (×2): 15 mL via OROMUCOSAL
  Filled 2016-07-11 (×2): qty 15

## 2016-07-11 MED ORDER — ATORVASTATIN CALCIUM 20 MG PO TABS
20.0000 mg | ORAL_TABLET | Freq: Every day | ORAL | Status: DC
  Administered 2016-07-12 – 2016-07-20 (×8): 20 mg via ORAL
  Filled 2016-07-11 (×8): qty 1

## 2016-07-11 MED ORDER — METHYLPREDNISOLONE SODIUM SUCC 125 MG IJ SOLR
60.0000 mg | Freq: Four times a day (QID) | INTRAMUSCULAR | Status: DC
  Administered 2016-07-11 – 2016-07-12 (×3): 60 mg via INTRAVENOUS
  Filled 2016-07-11 (×3): qty 2

## 2016-07-11 MED ORDER — SODIUM CHLORIDE 0.9% FLUSH
3.0000 mL | Freq: Two times a day (BID) | INTRAVENOUS | Status: DC
  Administered 2016-07-11 – 2016-07-21 (×14): 3 mL via INTRAVENOUS

## 2016-07-11 MED ORDER — ORAL CARE MOUTH RINSE
15.0000 mL | Freq: Two times a day (BID) | OROMUCOSAL | Status: DC
  Administered 2016-07-11: 15 mL via OROMUCOSAL

## 2016-07-11 MED ORDER — FUROSEMIDE 10 MG/ML IJ SOLN
60.0000 mg | Freq: Two times a day (BID) | INTRAMUSCULAR | Status: DC
  Administered 2016-07-11: 60 mg via INTRAVENOUS
  Filled 2016-07-11: qty 6

## 2016-07-11 MED ORDER — HYDRALAZINE HCL 20 MG/ML IJ SOLN: 10.0000 mg | Freq: Four times a day (QID) | INTRAMUSCULAR | Status: DC

## 2016-07-11 MED ORDER — NITROGLYCERIN IN D5W 200-5 MCG/ML-% IV SOLN: 5.0000 ug/min | Freq: Once | INTRAVENOUS | Status: DC

## 2016-07-11 MED ORDER — FUROSEMIDE 10 MG/ML IJ SOLN
40.0000 mg | Freq: Two times a day (BID) | INTRAMUSCULAR | Status: DC
Start: 2016-07-11 — End: 2016-07-11
  Administered 2016-07-11: 40 mg via INTRAVENOUS
  Filled 2016-07-11: qty 4

## 2016-07-11 MED ORDER — HYDRALAZINE HCL 20 MG/ML IJ SOLN
20.0000 mg | Freq: Four times a day (QID) | INTRAMUSCULAR | Status: DC | PRN
  Administered 2016-07-11: 20 mg via INTRAVENOUS
  Filled 2016-07-11: qty 1

## 2016-07-11 MED ORDER — ACETAMINOPHEN 325 MG PO TABS: 650.0000 mg | ORAL_TABLET | ORAL | Status: DC | PRN

## 2016-07-11 MED ORDER — IPRATROPIUM-ALBUTEROL 0.5-2.5 (3) MG/3ML IN SOLN
3.0000 mL | Freq: Three times a day (TID) | RESPIRATORY_TRACT | Status: DC
  Administered 2016-07-11 – 2016-07-13 (×6): 3 mL via RESPIRATORY_TRACT
  Filled 2016-07-11 (×6): qty 3

## 2016-07-11 MED ORDER — FAMOTIDINE IN NACL 20-0.9 MG/50ML-% IV SOLN
20.0000 mg | Freq: Every day | INTRAVENOUS | Status: DC
  Administered 2016-07-11: 20 mg via INTRAVENOUS
  Filled 2016-07-11: qty 50

## 2016-07-11 MED ORDER — METOPROLOL TARTRATE 5 MG/5ML IV SOLN
5.0000 mg | Freq: Four times a day (QID) | INTRAVENOUS | Status: DC
  Administered 2016-07-13 – 2016-07-15 (×9): 5 mg via INTRAVENOUS
  Filled 2016-07-11 (×9): qty 5

## 2016-07-11 MED ORDER — NITROGLYCERIN IN D5W 200-5 MCG/ML-% IV SOLN
0.0000 ug/min | INTRAVENOUS | Status: DC
  Administered 2016-07-11: 5 ug/min via INTRAVENOUS

## 2016-07-11 MED ORDER — IPRATROPIUM BROMIDE 0.02 % IN SOLN: 0.5000 mg | Freq: Three times a day (TID) | RESPIRATORY_TRACT | Status: DC

## 2016-07-11 MED ORDER — ONDANSETRON HCL 4 MG/2ML IJ SOLN: 4.0000 mg | Freq: Four times a day (QID) | INTRAMUSCULAR | Status: DC | PRN

## 2016-07-11 MED ORDER — SODIUM CHLORIDE 0.9% FLUSH: 3.0000 mL | INTRAVENOUS | Status: DC | PRN

## 2016-07-11 MED ORDER — IPRATROPIUM-ALBUTEROL 0.5-2.5 (3) MG/3ML IN SOLN
3.0000 mL | Freq: Four times a day (QID) | RESPIRATORY_TRACT | Status: DC
  Administered 2016-07-11: 3 mL via RESPIRATORY_TRACT
  Filled 2016-07-11: qty 3

## 2016-07-11 MED ORDER — FUROSEMIDE 10 MG/ML IJ SOLN
20.0000 mg | Freq: Once | INTRAMUSCULAR | Status: AC
  Administered 2016-07-11: 20 mg via INTRAVENOUS
  Filled 2016-07-11: qty 2

## 2016-07-11 MED ORDER — PANTOPRAZOLE SODIUM 40 MG PO TBEC
40.0000 mg | DELAYED_RELEASE_TABLET | Freq: Every day | ORAL | Status: DC
  Administered 2016-07-11: 40 mg via ORAL
  Filled 2016-07-11: qty 1

## 2016-07-11 MED ORDER — FUROSEMIDE 10 MG/ML IJ SOLN: 40.0000 mg | Freq: Once | INTRAMUSCULAR | Status: DC

## 2016-07-11 MED ORDER — FUROSEMIDE 10 MG/ML IJ SOLN
10.0000 mg/h | INTRAVENOUS | Status: DC
  Administered 2016-07-11 – 2016-07-12 (×2): 10 mg/h via INTRAVENOUS
  Filled 2016-07-11 (×5): qty 25

## 2016-07-11 MED ORDER — METOPROLOL TARTRATE 50 MG PO TABS
50.0000 mg | ORAL_TABLET | Freq: Two times a day (BID) | ORAL | Status: DC
  Administered 2016-07-11: 50 mg via ORAL
  Filled 2016-07-11: qty 1

## 2016-07-11 MED ORDER — MAGNESIUM SULFATE 2 GM/50ML IV SOLN
2.0000 g | Freq: Once | INTRAVENOUS | Status: AC
  Administered 2016-07-11: 2 g via INTRAVENOUS
  Filled 2016-07-11: qty 50

## 2016-07-11 MED ORDER — AMLODIPINE BESYLATE 10 MG PO TABS
10.0000 mg | ORAL_TABLET | Freq: Every day | ORAL | Status: DC
  Administered 2016-07-11 – 2016-07-12 (×2): 10 mg via ORAL
  Filled 2016-07-11 (×2): qty 1

## 2016-07-11 MED ORDER — MORPHINE SULFATE (PF) 2 MG/ML IV SOLN: 0.5000 mg | INTRAVENOUS | Status: DC | PRN

## 2016-07-11 NOTE — Progress Notes (Signed)
Monitor tech called and reported Pt had non sustain SVT, currently resolved. Pt is into NSR, still on BIPAP. Pt denies palpitation or other new symptom. Cardiology service notified. New order to check K+ and Mg.  Colleen Canesar Bri Wakeman, RN

## 2016-07-11 NOTE — Progress Notes (Addendum)
PROGRESS NOTE    Sydney Jacobs  JXB:[id number] DOB: 1934-07-05 DOA: 07/10/2016 PCP: Colette RibasGOLDING, JOHN CABOT, MD    Brief Narrative: Sydney Jacobs is a 82 y.o. female with medical history significant of diastolic CHF, COPD, CRF on 4 liters oxygen at home, AAA, retroperitoneal bleed post cath in 8/17, PAF, CKD with 3 hospitalizations in the last couple of months comes in with one day of sudden worsening sob.  Pt and husband provide history.  Pt saw her cardiology office Friday and her diuretics were changes from lasix to toresemide.  She was noted to be volume overloaded during that visit.  Husband says during that visit he was told "her heart was strong".  She has had no fevers.  She has had worse leg swelling bilaterally and today got more sob than usual.  She has had no n/v/d.  No pain anywhere.  On arrival to ED pt was in moderate respiratory distress and appeared volume overloaded was placed on bipap and given lasix 40mg  iv.  Pt seems to have improved on bipap over the last couple of hours.  She can speak in sentences, denies pain.  Pt has been referred for admission for acute chf exacerbation.    Assessment & Plan:   Principal Problem:   Acute on chronic diastolic congestive heart failure (HCC) Active Problems:   Elevated troponin   Chronic kidney disease (CKD), stage IV (severe) (HCC)   Essential hypertension   COPD (chronic obstructive pulmonary disease) (HCC)   Retroperitoneal hematoma in 8/17   PAF (paroxysmal atrial fibrillation) (HCC)   AAA (abdominal aortic aneurysm) (HCC)   Chronic respiratory failure (HCC)  1-Acute hypoxic Respiratory Failure;  Related to acute HF exacerbation. Component of COPD.  On BIPAP, continue as needed.  Increase lasix to 60 mg IV BID. Only 790 output since admission.  Chest x ray with air space disease, patient with no fever, or leukocytosis.  Nebulizer treatment. If patient develops wheezing would start steroids.  Came to re-evaluate patient, on  BIPAP, complaining of dyspnea. IV lasix ordered, started on nitroglycerin Gtt again, chest x ray ordered. CCM to see patient again. Morphine PRN if nitro or lasix doesn't help.   2-Acute Diastolic Heart failure exacerbation:  Weight 54---53---- Change lasix to 60 mg IV BID.  On nitroglycerin Gtt.   3-Chronic KD stage IV; cr baseline 2.1. Continue to monitor on lasix.   Recent  Retroperitoneal hematoma in 8/17- noted, holding anticoagulants for this reason  Elevated troponin; in setting HF. Follow trend.   PAF - nsr at this time. noted  HTN; Hydralazine, Norvasc.   DVT prophylaxis: SCD Code Status: Full code.  Family Communication: care discussed with patient.  Disposition Plan: Remain in the step down.    Consultants:   Cardiology    Procedures:   none  Antimicrobials:  none  Subjective: She is feeling better, breathing better.  She uses BIPAP at night at home.  Denies abdominal pain./   Objective: Vitals:   07/11/16 0109 07/11/16 0118 07/11/16 0130 07/11/16 0417  BP:  (!) 160/71    Pulse: 94 90    Resp: (!) 23 19    Temp:  98.8 F (37.1 C)  99.1 F (37.3 C)  TempSrc:  Axillary  Axillary  SpO2: 100% 100%    Weight:   53.7 kg (118 lb 6.4 oz)   Height:   4\' 10"  (1.473 m)     Intake/Output Summary (Last 24 hours) at 07/11/16 [id number] Last data filed at  07/11/16 0648  Gross per 24 hour  Intake             8.66 ml  Output              790 ml  Net          -781.34 ml   Filed Weights   07/10/16 1930 07/11/16 0130  Weight: 51.7 kg (114 lb) 53.7 kg (118 lb 6.4 oz)    Examination:  General exam: Appears calm and comfortable on BIPAP Respiratory system: Clear to auscultation. Respiratory effort normal. Cardiovascular system: S1 & S2 heard, RRR. No JVD, murmurs, rubs, gallops or clicks. No pedal edema. Gastrointestinal system: Abdomen is nondistended, soft and nontender. No organomegaly or masses felt. Normal bowel sounds heard. Central nervous system: Alert  and oriented. Follows command.  Extremities: Symmetric 5 x 5 power. Skin: No rashes, lesions or ulcers      Data Reviewed: I have personally reviewed following labs and imaging studies  CBC:  Recent Labs Lab 07/06/16 1139 07/10/16 1957  WBC 10.0 7.4  NEUTROABS 9,000* 5.8  HGB 10.0* 9.3*  HCT 30.7* 28.9*  MCV 85.5 88.4  PLT 172 205   Basic Metabolic Panel:  Recent Labs Lab 07/06/16 1139 07/10/16 1957 07/11/16 0250  NA 145 141 142  K 3.8 2.8* 3.0*  CL 101 100* 102  CO2 28 28 31   GLUCOSE 141* 130* 128*  BUN 50* 58* 54*  CREATININE 1.77* 2.25* 2.21*  CALCIUM 8.2* 8.1* 7.8*   GFR: Estimated Creatinine Clearance: 15 mL/min (by C-G formula based on SCr of 2.21 mg/dL). Liver Function Tests:  Recent Labs Lab 07/10/16 1957  AST 12*  ALT 11*  ALKPHOS 51  BILITOT 0.8  PROT 5.6*  ALBUMIN 3.2*   No results for input(s): LIPASE, AMYLASE in the last 168 hours. No results for input(s): AMMONIA in the last 168 hours. Coagulation Profile:  Recent Labs Lab 07/10/16 1957  INR 1.04   Cardiac Enzymes:  Recent Labs Lab 07/10/16 1957 07/11/16 0250  TROPONINI 0.03* 0.04*   BNP (last 3 results) No results for input(s): PROBNP in the last 8760 hours. HbA1C: No results for input(s): HGBA1C in the last 72 hours. CBG: No results for input(s): GLUCAP in the last 168 hours. Lipid Profile: No results for input(s): CHOL, HDL, LDLCALC, TRIG, CHOLHDL, LDLDIRECT in the last 72 hours. Thyroid Function Tests: No results for input(s): TSH, T4TOTAL, FREET4, T3FREE, THYROIDAB in the last 72 hours. Anemia Panel: No results for input(s): VITAMINB12, FOLATE, FERRITIN, TIBC, IRON, RETICCTPCT in the last 72 hours. Sepsis Labs:  Recent Labs Lab 07/10/16 2012 07/10/16 2254 07/10/16 2300  PROCALCITON  --  0.13  --   LATICACIDVEN 0.84  --  0.51    Recent Results (from the past 240 hour(s))  MRSA PCR Screening     Status: None   Collection Time: 07/11/16  1:16 AM  Result  Value Ref Range Status   MRSA by PCR NEGATIVE NEGATIVE Final    Comment:        The GeneXpert MRSA Assay (FDA approved for NASAL specimens only), is one component of a comprehensive MRSA colonization surveillance program. It is not intended to diagnose MRSA infection nor to guide or monitor treatment for MRSA infections.          Radiology Studies: Dg Chest Portable 1 View  Result Date: 07/10/2016 CLINICAL DATA:  Shortness of breath and bilateral lower extremity edema. EXAM: PORTABLE CHEST 1 VIEW COMPARISON:  Single-view of the  chest 06/14/2016 and 06/12/2016. FINDINGS: Bilateral airspace disease seen on the most recent examination has worsened. Small to moderate left pleural effusion is unchanged. There is a small right pleural effusion which appears increased. Atherosclerosis is noted. No pneumothorax. IMPRESSION: Worsened bilateral airspace disease compatible with increased pulmonary edema and/or pneumonia. New small right pleural effusion. Small to moderate left pleural effusion is unchanged since the most recent study. Atherosclerosis. Electronically Signed   By: Drusilla Kanner M.D.   On: 07/10/2016 20:09        Scheduled Meds: . amLODipine  10 mg Oral Daily  . atorvastatin  20 mg Oral q1800  . chlorhexidine  15 mL Mouth Rinse BID  . furosemide  40 mg Intravenous Q12H  . hydrALAZINE  50 mg Oral TID AC & HS  . ipratropium-albuterol  3 mL Nebulization QID  . mouth rinse  15 mL Mouth Rinse q12n4p  . metoprolol  50 mg Oral BID  . nitroGLYCERIN  5-200 mcg/min Intravenous Once  . pantoprazole  40 mg Oral Daily  . potassium chloride SA      . potassium chloride  20 mEq Oral BID  . sodium chloride flush  3 mL Intravenous Q12H   Continuous Infusions:    LOS: 1 day    Time spent: 35 minutes.     Alba Cory, MD [facility] Pager [phone]  If 7PM-7AM, please contact night-coverage [email or web address] Password [facility] 07/11/2016, 7:33 AM

## 2016-07-11 NOTE — ED Notes (Signed)
[facility] RN with update as well as PCT result and informed of K 20 mEq to pt Pt care given to [facility]

## 2016-07-11 NOTE — Significant Event (Signed)
Rapid Response Event Note  Overview: Time Called: 1809 Arrival Time: 1811 Event Type: Respiratory  Initial Focused Assessment:  Called by Rn for respiratory distress.  Upon my arrival to patients room RN and family at bedside.  Patient is on Bipap, seems very anxious, pulling at mask and saying she can't breath.  176/88, HR95, RR 30's, 95% on bipap.  Breath Sounds Crackles.     Interventions:  Scheduled lasix given.  Patient seems a little more comfortable, still slightly labored.  MD paged and orders received.    Plan of Care (if not transferred):  Patietn to monitor patient and respiratory status.   Event Summary:  Rn to call if assistance needed   at      at          [facility], Sydney Jacobs

## 2016-07-11 NOTE — Progress Notes (Signed)
Attempted to give report to [facility]

## 2016-07-11 NOTE — Progress Notes (Signed)
eLink Physician-Brief Progress Note Patient Name: Sydney Jacobs DOB: November 02, 1934 MRN: [id number]   Date of Service  07/11/2016  HPI/Events of Note  Camera check on patient after transfer to intensive care unit. Currently on BiPAP. Nurse at bedside. Respiratory rate 15. Currently on FiO2 0.6. Appears comfortable. Hypertension with systolic blood pressure 163. Started on Lasix drip at 10 mg per hour IV as well as nitroglycerin drip at 5 mcg/m IV. Currently nothing by mouth status. Repeat potassium 3.5 at 1648.   eICU Interventions  1. Discontinuing oral Lopressor, Protonix, hydralazine, & potassium.  2. Starting Lopressor 5 mg IV every 6 hours with hold parameters 3. Starting hydralazine 10 mg IV every 6 hours with hold parameters 4. Continuing nitroglycerin & Lasix infusions 5. Repeat electrolyte panel in the morning 6. Close monitoring of respiratory status low threshold and high probability of endotracheal intubation      Intervention Category Major Interventions: Respiratory failure - evaluation and management;Electrolyte abnormality - evaluation and management;Hypertension - evaluation and management  Lawanda CousinsJennings Ashlye Oviedo 07/11/2016, 9:01 PM

## 2016-07-11 NOTE — Progress Notes (Signed)
Pt complaining on difficulty breathing despite being on BIPAP. On assessment patient very anxious, tachypnea, lungs sound diminished. MD and rapid response paged. Pt received 60 mg of lasix STAT. New order for Nito drip and mag given. Critical care team to come see pt. Will continue to monitor.  Colleen Canesar Newell Frater, RN

## 2016-07-11 NOTE — Progress Notes (Signed)
Pt taken off bipap and placed on 3L [place] at this time. Pt denies SOB or increased WOB. Pt with clear/diminished BS throughout. RT will continue to monitor.

## 2016-07-11 NOTE — Consult Note (Addendum)
Primary cardiologist: Dr Elease Hashimoto Consulting cardiologist: Dr Dina Rich  Requesting physician: Dr Sunnie Nielsen Indication: acute on chronic diastolic HF  Clinical Summary Ms. Kievit is a 82 y.o.female history of chronic diastolic HF, CKD III, COPD on home O2 , AAA, chest pain with recent cath 05/2016 without significant CAD, retroperitoneal bleed s/p recent cath , pulmonary nodule, with multiple admissions over the last few weeks for recurrent diastolic HF and COPD exacerbation.  Seen in clinic 07/06/16 by PA Dunn with increased SOB. Lasix changed to torsemide 20mg  daily. Admitted yesterday with worsening SOB.    Allergies  Allergen Reactions  . Sulfa Antibiotics Other (See Comments)    unknown    Medications Scheduled Medications: . amLODipine  10 mg Oral Daily  . atorvastatin  20 mg Oral q1800  . chlorhexidine  15 mL Mouth Rinse BID  . furosemide  20 mg Intravenous Once  . furosemide  60 mg Intravenous Q12H  . hydrALAZINE  50 mg Oral TID AC & HS  . ipratropium-albuterol  3 mL Nebulization QID  . mouth rinse  15 mL Mouth Rinse q12n4p  . metoprolol  50 mg Oral BID  . nitroGLYCERIN  5-200 mcg/min Intravenous Once  . pantoprazole  40 mg Oral Daily  . potassium chloride SA      . potassium chloride  20 mEq Oral BID  . sodium chloride flush  3 mL Intravenous Q12H     Infusions:     PRN Medications:  sodium chloride, acetaminophen, albuterol, ondansetron (ZOFRAN) IV, sodium chloride flush   Past Medical History:  Diagnosis Date  . AAA (abdominal aortic aneurysm) (HCC)    a. 4.4cm by CT abd 05/29/16.  Marland Kitchen Anemia, unspecified   . Chronic diastolic CHF (congestive heart failure) (HCC)   . Chronic respiratory failure (HCC)   . CKD (chronic kidney disease) stage 3, GFR 30-59 ml/min   . COPD (chronic obstructive pulmonary disease) (HCC)   . Essential hypertension   . History of stroke   . Hyperlipidemia   . Mild CAD    a. LHC 05/2016 - 25% pRCA, 25% ramus, and mildly  elevated LVEDP. c/b post-cath RPH.  . On home oxygen therapy   . PAF (paroxysmal atrial fibrillation) (HCC) 06/2016   a. dx 06/2016 - not a candidate for anticoag given recent retroperitoneal hematoma.  . Pulmonary nodule    Left upper lobe 1 cm pulmonary nodule - chest CT July 2017  . Retroperitoneal hematoma    a. after cath 05/2016 requiring txfusion.    Past Surgical History:  Procedure Laterality Date  . ABDOMINAL HYSTERECTOMY    . APPENDECTOMY    . CARDIAC CATHETERIZATION N/A 05/26/2016   Procedure: Left Heart Cath and Coronary Angiography;  Surgeon: Corky Crafts, MD;  Location: [facility] INVASIVE CV LAB;  Service: Cardiovascular;  Laterality: N/A;  . CAROTID ENDARTERECTOMY Left   . HEMORRHOID SURGERY    . PARTIAL HIP ARTHROPLASTY    . TONSILLECTOMY      Family History  Problem Relation Age of Onset  . Hypertension Mother   . Hypertension Father     Social History Ms. Dehaan reports that she has quit smoking. Her smoking use included Cigarettes. She has never used smokeless tobacco. Ms. Mcgurk reports that she does not drink alcohol.  Review of Systems CONSTITUTIONAL: No weight loss, fever, chills, weakness or fatigue.  HEENT: Eyes: No visual loss, blurred vision, double vision or yellow sclerae. No hearing loss, sneezing, congestion, runny nose or sore throat.  SKIN: No rash or itching.  CARDIOVASCULAR: No chest pain, no palpitations.  RESPIRATORY: per HPI GASTROINTESTINAL: No anorexia, nausea, vomiting or diarrhea. No abdominal pain or blood.  GENITOURINARY: no polyuria, no dysuria NEUROLOGICAL: No headache, dizziness, syncope, paralysis, ataxia, numbness or tingling in the extremities. No change in bowel or bladder control.  MUSCULOSKELETAL: No muscle, back pain, joint pain or stiffness.  HEMATOLOGIC: No anemia, bleeding or bruising.  LYMPHATICS: No enlarged nodes. No history of splenectomy.  PSYCHIATRIC: No history of depression or anxiety.      Physical  Examination Blood pressure (!) 160/71, pulse 90, temperature 98.9 F (37.2 C), temperature source Axillary, resp. rate 19, height 4\' 10"  (1.473 m), weight 118 lb 6.4 oz (53.7 kg), SpO2 92 %.  Intake/Output Summary (Last 24 hours) at 07/11/16 0824 Last data filed at 07/11/16 0648  Gross per 24 hour  Intake             8.66 ml  Output              790 ml  Net          -781.34 ml    HEENT: sclera clear, throat clear  Cardiovascular: RRR, no m/r/g. JVD to angle of jawe  Respiratory: bilateral crackles  GI: abdomen soft, NT, ND  MSK: no Le edema  Neuro: no focal deficits  Psych: appropriate affect   Lab Results  Basic Metabolic Panel:  Recent Labs Lab 07/06/16 1139 07/10/16 1957 07/11/16 0250  NA 145 141 142  K 3.8 2.8* 3.0*  CL 101 100* 102  CO2 28 28 31   GLUCOSE 141* 130* 128*  BUN 50* 58* 54*  CREATININE 1.77* 2.25* 2.21*  CALCIUM 8.2* 8.1* 7.8*    Liver Function Tests:  Recent Labs Lab 07/10/16 1957  AST 12*  ALT 11*  ALKPHOS 51  BILITOT 0.8  PROT 5.6*  ALBUMIN 3.2*    CBC:  Recent Labs Lab 07/06/16 1139 07/10/16 1957  WBC 10.0 7.4  NEUTROABS 9,000* 5.8  HGB 10.0* 9.3*  HCT 30.7* 28.9*  MCV 85.5 88.4  PLT 172 205    Cardiac Enzymes:  Recent Labs Lab 07/10/16 1957 07/11/16 0250  TROPONINI 0.03* 0.04*    BNP: Invalid input(s): POCBNP     Impression/Recommendations 1. Acute on chronic diastolic HF - echo 06/2016 LVEF 60-65%, Grade II diasotlic dysfunction - discharge weight 06/17/16 was 115 lbs. During clinic visit 07/06/16 up to 120 lbs (then changed to torsemide), admit weight 115 lbs.  - CXR worsened pulm edema vs pneumonia, bilateral effusions. BNP 1600. - negative since admit, she is on lasix 60mg  IV bid starting this evening (since admit received 40mg  IV x 2 doses). Downtrend in Cr with diuresis consistent with venous congestion and CHF.  - continue IV lasix.  - episodes of afib with RVR on tele, significant HTN at  times. Both could be playing role in her recent frequent CHF exacerbations.  - currently on low dose NG drip. Will d/c and titrate oral regimen for bp.   2. PAF - continue rate control with lopressor -CHADS2Vasc score of 5. She has not been on anticoag, she had a retroperitoneal bleed and severe groin hematoma after cath 05/2016, has not been started since that time. . I think if Hgb stable during this admit would be reasonable to anticoag start this admission.  - had some afib with RVR on telemetry. We will increase lopressor to 75mg  bid.   3. Hypokalemia - replacment per primary team  4. HTN - outpatient renal US pending - likely main component is unerlyding stage IV CKD - will titrate oral regimen. Elevated bp's may be playing role in recent frequent CHF exacerbations.  - incrase hydral to 75mg  tid. If needed can change lopressor to labetolol in the near future.     Dina RichJonathan Branch, M.D.

## 2016-07-11 NOTE — Progress Notes (Signed)
CRITICAL VALUE ALERT  Critical value received:  Troponin 0.04  Date of notification:  07/11/2016  Time of notification:  0343  Critical value read back: yes   Nurse who received alert:  Threasa AlphaS Findlay Dagher  MD notified (1st page):  Schorr  Time of first page:  [phone]   MD notified (2nd page):    Time of second page:  Responding MD:    Time MD responded:

## 2016-07-11 NOTE — Care Management Note (Signed)
Case Management Note  Patient Details  Name: Sydney Jacobs MRN: [id number] Date of Birth: 06/12/1934  Subjective/Objective:  Pt presented with Heart Failure. Initiated on Bipap-Pt is from home with husband and has support of son. Pt is HRI with [facility] for RN and PT Services.    Action/Plan: Pt would like to continue services with [facility]. Pt will need resumption orders once stable if home continues to be the plan. CM did make Liaison Abby aware that pt is hospitalized. CM will continue to monitor for additional needs.   Expected Discharge Date:                  Expected Discharge Plan:  Home w Home Health Services  In-House Referral:  NA  Discharge planning Services  CM Consult  Post Acute Care Choice:  Home Health, Resumption of Svcs/PTA Provider Choice offered to:  Patient  DME Arranged:    DME Agency:     HH Arranged:  RN, PT HH Agency:  [facility] ([facility])  Status of Service:  In process, will continue to follow  If discussed at Long Length of Stay Meetings, dates discussed:    Additional Comments:  Gala LewandowskyGraves-Bigelow, Aveen Stansel Kaye, RN 07/11/2016, 10:45 AM

## 2016-07-11 NOTE — Consult Note (Signed)
PULMONARY / CRITICAL CARE MEDICINE   Name: Sydney Jacobs MRN: [id number] DOB: 1934/10/16    ADMISSION DATE:  07/10/2016 CONSULTATION DATE:  9/4  REFERRING MD:  Regalado ([facility])   CHIEF COMPLAINT:  Respiratory failure   HISTORY OF PRESENT ILLNESS:   82yo female with multiple medical problems including chronic respiratory failure on the basis of dCHF and COPD on home O2, CKD III,  pulmonary nodule, PAF, recent cath without significant CAD c/b retroperitoneal bleed post procedure.  Presented 9/3 with worsening SOB.  She was admitted by [facility] to SDU on bipap with acute on chronic respiratory failure r/t acute on chronic CHF +/- COPD.  On 9/4 she remained extremely SOB, failed brief trial off bipap and PCCM consulted.   Currently comfortable on bipap.  States dyspnea is "much better".  Denies purulent sputum, chest pain, hemoptysis.   PAST MEDICAL HISTORY :  She  has a past medical history of AAA (abdominal aortic aneurysm) (HCC); Anemia, unspecified; Chronic diastolic CHF (congestive heart failure) (HCC); Chronic respiratory failure (HCC); CKD (chronic kidney disease) stage 3, GFR 30-59 ml/min; COPD (chronic obstructive pulmonary disease) (HCC); Essential hypertension; History of stroke; Hyperlipidemia; Mild CAD; On home oxygen therapy; PAF (paroxysmal atrial fibrillation) (HCC) (06/2016); Pulmonary nodule; and Retroperitoneal hematoma.  PAST SURGICAL HISTORY: She  has a past surgical history that includes Partial hip arthroplasty; Appendectomy; Tonsillectomy; Abdominal hysterectomy; Hemorrhoid surgery; Cardiac catheterization (N/A, 05/26/2016); and Carotid endarterectomy (Left).  Allergies  Allergen Reactions  . Sulfa Antibiotics Other (See Comments)    unknown    No current facility-administered medications on file prior to encounter.    Current Outpatient Prescriptions on File Prior to Encounter  Medication Sig  . levalbuterol (XOPENEX) 0.63 MG/3ML nebulizer solution Take 3 mLs (0.63  mg total) by nebulization 3 (three) times daily.  . predniSONE (DELTASONE) 10 MG tablet Take 1 tablet (10 mg total) by mouth daily with breakfast. Take 6 tablets today and then decrease by 1 tablet daily until none are left.  . Vitamin D, Ergocalciferol, (DRISDOL) 50000 units CAPS capsule Take 1 capsule by mouth once a week. Takes on Fridays.  Marland Kitchen albuterol (PROVENTIL HFA;VENTOLIN HFA) 108 (90 Base) MCG/ACT inhaler Inhale 2 puffs into the lungs every 6 (six) hours as needed for wheezing or shortness of breath.  Marland Kitchen amLODipine (NORVASC) 10 MG tablet Take 1 tablet (10 mg total) by mouth daily.  Marland Kitchen atorvastatin (LIPITOR) 20 MG tablet Take 1 tablet (20 mg total) by mouth daily at 6 PM.  . cetirizine (ZYRTEC) 10 MG tablet Take 10 mg by mouth daily.  . hydrALAZINE (APRESOLINE) 100 MG tablet Take 1 tablet (100 mg total) by mouth 3 (three) times daily. (Patient not taking: Reported on 07/10/2016)  . ipratropium (ATROVENT) 0.02 % nebulizer solution Take 2.5 mLs (0.5 mg total) by nebulization 3 (three) times daily.  Marland Kitchen levofloxacin (LEVAQUIN) 500 MG tablet Take 1 tablet (500 mg total) by mouth daily. (Patient not taking: Reported on 07/10/2016)  . metoprolol (LOPRESSOR) 50 MG tablet Take 1 tablet (50 mg total) by mouth 2 (two) times daily.  . pantoprazole (PROTONIX) 40 MG tablet Take 1 tablet (40 mg total) by mouth daily.  Marland Kitchen torsemide (DEMADEX) 20 MG tablet Take 1 tablet (20 mg total) by mouth 2 (two) times daily.    FAMILY HISTORY:  Her indicated that her mother is deceased. She indicated that her father is deceased.    SOCIAL HISTORY: She  reports that she has quit smoking. Her smoking use included Cigarettes.  She has never used smokeless tobacco. She reports that she does not drink alcohol or use drugs.  REVIEW OF SYSTEMS:   As per HPI - All other systems reviewed and were neg.    SUBJECTIVE:    VITAL SIGNS: BP (!) 150/69   Pulse 74   Temp 98.2 F (36.8 C) (Axillary)   Resp 14   Ht 4\' 10"  (1.473 m)    Wt 53.7 kg (118 lb 6.4 oz)   SpO2 97%   BMI 24.75 kg/m   HEMODYNAMICS:    VENTILATOR SETTINGS: FiO2 (%):  [40 %] 40 %  INTAKE / OUTPUT: I/O last 3 completed shifts: In: 8.7 [I.V.:8.7] Out: 790 [Urine:790]  PHYSICAL EXAMINATION: General:  Chronically ill appearing female, NAD on bipap  Neuro:  Drowsy but easily arousable, follows commands, answers questions appropriately  HEENT:  Mm moist, bipap  Cardiovascular:  s1s2 irreg Lungs:  resps even, non labored on bipap, diminished L base, crackles throughout L>R  Abdomen:  Round, soft, non tender  Musculoskeletal:  Warm and dry, scant BLE edema  LABS:  BMET  Recent Labs Lab 07/06/16 1139 07/10/16 1957 07/11/16 0250  NA 145 141 142  K 3.8 2.8* 3.0*  CL 101 100* 102  CO2 28 28 31   BUN 50* 58* 54*  CREATININE 1.77* 2.25* 2.21*  GLUCOSE 141* 130* 128*    Electrolytes  Recent Labs Lab 07/06/16 1139 07/10/16 1957 07/11/16 0250  CALCIUM 8.2* 8.1* 7.8*    CBC  Recent Labs Lab 07/06/16 1139 07/10/16 1957  WBC 10.0 7.4  HGB 10.0* 9.3*  HCT 30.7* 28.9*  PLT 172 205    Coag's  Recent Labs Lab 07/10/16 1957  INR 1.04    Sepsis Markers  Recent Labs Lab 07/10/16 2012 07/10/16 2254 07/10/16 2300  LATICACIDVEN 0.84  --  0.51  PROCALCITON  --  0.13  --     ABG  Recent Labs Lab 07/10/16 2015  PHART 7.450  PCO2ART 46.1  PO2ART 144*    Liver Enzymes  Recent Labs Lab 07/10/16 1957  AST 12*  ALT 11*  ALKPHOS 51  BILITOT 0.8  ALBUMIN 3.2*    Cardiac Enzymes  Recent Labs Lab 07/10/16 1957 07/11/16 0250 07/11/16 0813  TROPONINI 0.03* 0.04* 0.04*    Glucose No results for input(s): GLUCAP in the last 168 hours.  Imaging Dg Chest Portable 1 View  Result Date: 07/10/2016 CLINICAL DATA:  Shortness of breath and bilateral lower extremity edema. EXAM: PORTABLE CHEST 1 VIEW COMPARISON:  Single-view of the chest 06/14/2016 and 06/12/2016. FINDINGS: Bilateral airspace disease seen on  the most recent examination has worsened. Small to moderate left pleural effusion is unchanged. There is a small right pleural effusion which appears increased. Atherosclerosis is noted. No pneumothorax. IMPRESSION: Worsened bilateral airspace disease compatible with increased pulmonary edema and/or pneumonia. New small right pleural effusion. Small to moderate left pleural effusion is unchanged since the most recent study. Atherosclerosis. Electronically Signed   By: Drusilla Kanner M.D.   On: 07/10/2016 20:09    STUDIES:  2D echo 8/7>>> EF 60-65%, grade 2 diastolic dysfunction, mod MR  CULTURES:   ANTIBIOTICS:   SIGNIFICANT EVENTS:   LINES/TUBES:   DISCUSSION: 82yo female with acute on chronic respiratory failure r/t decompensated heart failure with underlying COPD.   ASSESSMENT / PLAN:  PULMONARY Acute on chronic respiratory failure - r/t decompensated diastolic heart failure with underlying COPD  Bilateral Pleural effusion L>R COPD without acute exacerbation  Known pulmonary nodule  P:   Continue bipap  Diuresis per cards  duonebs  No indication systemic steroids  F/u CXR   CARDIOVASCULAR Acute on chronic diastolic heart failure.  More frequent exacerbations recently.  BNP elevated.  Recently changed to torsemide as outpt.  AFib with RVR  HTN  P:  Cardiology following  Lasix per cards  Continue bipap as above  Lopressor  Consider anticoagulation per cards  BP control  Trend troponin  F/u BNP   RENAL CKD IV  Hypokalemia  P:   Replete K  Check Mg, Phos  F/u chem  Monitor renal function with aggressive diuresis   GASTROINTESTINAL No active issue  P:   NPO for now   HEMATOLOGIC No active issue  P:  Consider anticoagulation per cards  F/u CBC   INFECTIOUS No active issue  P:   Monitor WBC, fever curve off abx   ENDOCRINE Hyperglycemia - no known hx DM   P:   Monitor glucose on chem   NEUROLOGIC No active issue  P:   Monitor mental  status on bipap  Supportive care   FAMILY  - Updates: husband and 2 sons updated at length (>4330mins) 9/4.  Began to discuss goals of care.  Brought up the possibility of intubation if she does not improve on bipap with diuresis.  She seems to have declined over the last several months with more frequent exacerbation and overall decline in functional status.  Discussed that intubation/mech vent would not be treating the underling cause of her respiratory failure, and that it may prove very difficult to wean her off the ventilator.  As of right now they want to continue full aggressive medical care including intubation if needed, but will continue to discuss with family.  May want to consider palliative care input.    - Inter-disciplinary family meet or Palliative Care meeting due by:  9/11  Dirk DressKaty Whiteheart, NP 07/11/2016  12:39 PM Pager: [phone] or [phone]  Attending note: I have seen and examined the patient with nurse practitioner/resident and agree with the note. History, labs and imaging reviewed.  81 with CHF, grade II diastolic heart, COPD on o2, CKD, pulmonary nodule, PAF admitted with worsening dyspnea from CHF, volume overload. COPD exacerbation possible but she is not wheezing on examination.  On and off bipap today.  PCCM called to assess intubation needs.  She looks stable on bipap at present Continue diuresis as per cardiology. Nebs, Hold on steroids. Continue to monitor.  Chilton GreathousePraveen Ardythe Klute MD [facility] Pager [phone] If no answer or after 3pm call: [phone] 07/11/2016, 2:16 PM  Critical care time - 35 mins. This represents my time independent of the NPs time taking care of the pt.  Chilton GreathousePraveen Evangela Heffler MD [facility] Pager [phone] If no answer or after 3pm call: [phone] 07/11/2016, 2:12 PM

## 2016-07-11 NOTE — Progress Notes (Signed)
[facility] and placed on Bi[facility]. Patient is tolerating well and RT will continue to monitor.

## 2016-07-11 NOTE — Progress Notes (Signed)
RN called RT to bedside for pt needing to go back on bipap at this time. Pt with increased WOB and SOB on 5L [place]. Pt reports improvement with breathing upon bipap being placed. RT will continue to monitor.

## 2016-07-12 ENCOUNTER — Inpatient Hospital Stay (HOSPITAL_COMMUNITY): Payer: Commercial Managed Care - HMO

## 2016-07-12 DIAGNOSIS — J42 Unspecified chronic bronchitis: Secondary | ICD-10-CM

## 2016-07-12 DIAGNOSIS — I10 Essential (primary) hypertension: Secondary | ICD-10-CM

## 2016-07-12 DIAGNOSIS — J9601 Acute respiratory failure with hypoxia: Secondary | ICD-10-CM

## 2016-07-12 LAB — BASIC METABOLIC PANEL
ANION GAP: 11 (ref 5–15)
BUN: 60 mg/dL — ABNORMAL HIGH (ref 6–20)
CALCIUM: 8.4 mg/dL — AB (ref 8.9–10.3)
CO2: 30 mmol/L (ref 22–32)
CREATININE: 2.35 mg/dL — AB (ref 0.44–1.00)
Chloride: 103 mmol/L (ref 101–111)
GFR calc Af Amer: 22 mL/min — ABNORMAL LOW (ref >60)
GFR, EST NON AFRICAN AMERICAN: 19 mL/min — AB (ref >60)
GLUCOSE: 156 mg/dL — AB (ref 65–99)
Potassium: 3.8 mmol/L (ref 3.5–5.1)
Sodium: 144 mmol/L (ref 135–145)

## 2016-07-12 LAB — CBC
HCT: 26.8 % — ABNORMAL LOW (ref 36.0–46.0)
HEMOGLOBIN: 8.4 g/dL — AB (ref 12.0–15.0)
MCH: 27.9 pg (ref 26.0–34.0)
MCHC: 31.3 g/dL (ref 30.0–36.0)
MCV: 89 fL (ref 78.0–100.0)
PLATELETS: 178 10*3/uL (ref 150–400)
RBC: 3.01 MIL/uL — ABNORMAL LOW (ref 3.87–5.11)
RDW: 15.7 % — AB (ref 11.5–15.5)
WBC: 5.5 10*3/uL (ref 4.0–10.5)

## 2016-07-12 LAB — MAGNESIUM: MAGNESIUM: 2.1 mg/dL (ref 1.7–2.4)

## 2016-07-12 LAB — PROCALCITONIN: PROCALCITONIN: 0.21 ng/mL

## 2016-07-12 MED ORDER — ORAL CARE MOUTH RINSE
15.0000 mL | Freq: Two times a day (BID) | OROMUCOSAL | Status: DC
  Administered 2016-07-13 – 2016-07-14 (×2): 15 mL via OROMUCOSAL

## 2016-07-12 MED ORDER — ALBUTEROL SULFATE (2.5 MG/3ML) 0.083% IN NEBU
2.5000 mg | INHALATION_SOLUTION | RESPIRATORY_TRACT | Status: DC | PRN
  Administered 2016-07-13 – 2016-07-14 (×4): 2.5 mg via RESPIRATORY_TRACT
  Filled 2016-07-12 (×4): qty 3

## 2016-07-12 MED ORDER — FAMOTIDINE 20 MG PO TABS
20.0000 mg | ORAL_TABLET | Freq: Every day | ORAL | Status: DC
  Administered 2016-07-13 – 2016-07-20 (×8): 20 mg via ORAL
  Filled 2016-07-12 (×8): qty 1

## 2016-07-12 MED ORDER — FAMOTIDINE 20 MG PO TABS
20.0000 mg | ORAL_TABLET | Freq: Two times a day (BID) | ORAL | Status: DC
  Administered 2016-07-12: 20 mg via ORAL
  Filled 2016-07-12: qty 1

## 2016-07-12 NOTE — Progress Notes (Signed)
eLink Physician-Brief Progress Note Patient Name: Sydney Jacobs DOB: 13-Mar-1934 MRN: [id number]   Date of Service  07/12/2016  HPI/Events of Note  Camera check in patient with respiratory failure requiring BiPAP support. Patient resting comfortably. Does awaken easily to voice. Reports dyspnea is improving. Denies any pain. Blood pressure currently controlled.   eICU Interventions  Continue close ICU monitoring.      Intervention Category Major Interventions: Respiratory failure - evaluation and management  Lawanda CousinsJennings Nestor 07/12/2016, 12:19 AM

## 2016-07-12 NOTE — Progress Notes (Signed)
Patient ID: Sydney Jacobs, female   DOB: 08-09-1934, 82 y.o.   MRN: [id number]    Subjective:  Chronic dyspnea mouth dry on Bipap  Objective:  Vitals:   07/12/16 0445 07/12/16 0500 07/12/16 0600 07/12/16 0700  BP:  (!) 83/66 103/61 96/67  Pulse:  73 69 71  Resp:  (!) 21 12 (!) 23  Temp: 97.9 F (36.6 C)     TempSrc: Axillary     SpO2:  100% 99% 99%  Weight:   117 lb 4.6 oz (53.2 kg)   Height:        Intake/Output from previous day:  Intake/Output Summary (Last 24 hours) at 07/12/16 0739 Last data filed at 07/12/16 0600  Gross per 24 hour  Intake           319.94 ml  Output             1100 ml  Net          -780.06 ml    Physical Exam: Affect appropriate Chronically ill white female  HEENT: normal Neck supple with no adenopathy JVP normal no bruits no thyromegaly Lungs on Bipap poor inspiratory effort  Heart:  S1/S2 no murmur, no rub, gallop or click PMI normal Abdomen: benighn, BS positve, no tenderness, no AAA no bruit.  No HSM or HJR Distal pulses intact with no bruits No edema Neuro non-focal Skin warm and dry No muscular weakness   Lab Results: Basic Metabolic Panel:  Recent Labs  40/98/1108/02/22 0250 07/11/16 1648 07/12/16 0458  NA 142  --  144  K 3.0* 3.5 3.8  CL 102  --  103  CO2 31  --  30  GLUCOSE 128*  --  156*  BUN 54*  --  60*  CREATININE 2.21*  --  2.35*  CALCIUM 7.8*  --  8.4*  MG  --  1.3* 2.1   Liver Function Tests:  Recent Labs  07/10/16 1957  AST 12*  ALT 11*  ALKPHOS 51  BILITOT 0.8  PROT 5.6*  ALBUMIN 3.2*   CBC:  Recent Labs  07/10/16 1957 07/12/16 0458  WBC 7.4 5.5  NEUTROABS 5.8  --   HGB 9.3* 8.4*  HCT 28.9* 26.8*  MCV 88.4 89.0  PLT 205 178   Cardiac Enzymes:  Recent Labs  07/11/16 0250 07/11/16 0813 07/11/16 1447  TROPONINI 0.04* 0.04* 0.04*    Imaging: Dg Chest Port 1 View  Result Date: 07/12/2016 CLINICAL DATA:  Acute respiratory failure, history of CHF, COPD, chronic renal insufficiency  EXAM: PORTABLE CHEST 1 VIEW COMPARISON:  Portable chest x-ray of July 11, 2016 FINDINGS: The lungs are adequately inflated. The left hemidiaphragm remains obscured. There is some obscuration of the right hemidiaphragm now. The pulmonary interstitial markings are increased bilaterally. There are confluent alveolar opacities in the left upper lobe. The heart is normal in size. There is calcification in the wall of the aortic arch. The trachea is midline. The bony thorax exhibits no acute abnormality. IMPRESSION: Slight interval worsening of pulmonary interstitial edema and bilateral pleural effusions. Persistent pulmonary vascular engorgement. Aortic atherosclerosis. Electronically Signed   By: David  SwazilandJordan M.D.   On: 07/12/2016 07:12   Dg Chest Port 1 View  Result Date: 07/11/2016 CLINICAL DATA:  Acute on chronic respiratory failure. History of COPD. Worsening shortness of breath. EXAM: PORTABLE CHEST 1 VIEW COMPARISON:  Single-view of the chest 07/10/2016 and 06/14/2016. FINDINGS: Left worse than right airspace disease persists appearing aeration improved improved on the  right since the most recent exam. Left pleural effusion is again identified. Heart size is upper normal. Aortic atherosclerosis is seen. IMPRESSION: Extensive bilateral airspace disease shows improvement on the right since the most recent exam. No new abnormality. Left pleural effusion. Electronically Signed   By: Drusilla Kanner M.D.   On: 07/11/2016 19:34   Dg Chest Portable 1 View  Result Date: 07/10/2016 CLINICAL DATA:  Shortness of breath and bilateral lower extremity edema. EXAM: PORTABLE CHEST 1 VIEW COMPARISON:  Single-view of the chest 06/14/2016 and 06/12/2016. FINDINGS: Bilateral airspace disease seen on the most recent examination has worsened. Small to moderate left pleural effusion is unchanged. There is a small right pleural effusion which appears increased. Atherosclerosis is noted. No pneumothorax. IMPRESSION: Worsened  bilateral airspace disease compatible with increased pulmonary edema and/or pneumonia. New small right pleural effusion. Small to moderate left pleural effusion is unchanged since the most recent study. Atherosclerosis. Electronically Signed   By: Drusilla Kanner M.D.   On: 07/10/2016 20:09    Cardiac Studies:  ECG: SR poor R wave progression    Telemetry:  NSR 07/12/2016   Echo: 06/13/16 EF 60-65% mild to moderate MR   Medications:   . amLODipine  10 mg Oral Daily  . atorvastatin  20 mg Oral q1800  . chlorhexidine  15 mL Mouth Rinse BID  . famotidine (PEPCID) IV  20 mg Intravenous QHS  . furosemide  40 mg Intravenous Once  . hydrALAZINE  10 mg Intravenous Q6H  . ipratropium-albuterol  3 mL Nebulization TID  . mouth rinse  15 mL Mouth Rinse q12n4p  . methylPREDNISolone (SOLU-MEDROL) injection  60 mg Intravenous Q6H  . metoprolol  5 mg Intravenous Q6H  . sodium chloride flush  3 mL Intravenous Q12H     . furosemide (LASIX) infusion 10 mg/hr (07/11/16 2005)  . nitroGLYCERIN Stopped (07/12/16 0507)    Assessment/Plan:  1. Acute on chronic diastolic HF - echo 06/2016 LVEF 60-65%, Grade II diasotlic dysfunction - discharge weight 06/17/16 was 115 lbs. During clinic visit 07/06/16 up to 120 lbs (then changed to torsemide), admit weight 115 lbs.  - CXR worsened pulm edema vs pneumonia, bilateral effusions. BNP 1600. - negative since admit, on lasix drip  Downtrend in Cr with diuresis consistent with venous congestion and CHF.  - continue IV lasix.  - episodes of afib with RVR on tele, significant HTN at times. Both could be playing role in her recent frequent CHF exacerbations.  - iv nitro d/c on oral meds BP low this am   2. PAF - continue rate control with lopressor -CHADS2Vasc score of 5. She has not been on anticoag, she had a retroperitoneal bleed and severe groin hematoma after cath 05/2016, has not been started since that time. . I think if Hgb stable during this admit would  be reasonable to anticoag start this admission.  - had some afib with RVR on telemetry. Lopressor changed to iv due to bipap  3. Hypokalemia - replacment per primary team  4. HTN - outpatient renal US pending - likely main component is unerlyding stage IV CKD - will titrate oral regimen. Elevated bp's may be playing role in recent frequent CHF exacerbations.  - meds changed to iv to keep bipap on   Charlton Haws 07/12/2016, 7:39 AM

## 2016-07-12 NOTE — Progress Notes (Signed)
Pt. A&Ox4.  Complains of extreme mouth dryness on Bipap.  Dr. Eden EmmsNishan said I could take off bipap to swab her mouth out.  Pt. Now on venti-mask at 40%, O2 is 98% and feels no SOB. Has ask to not have bipap on right now.  I will continue to monitor her closely.

## 2016-07-12 NOTE — Progress Notes (Signed)
eLink Physician-Brief Progress Note Patient Name: Sydney Jacobs DOB: [birth year] MRN: [id number]   Date of Service  07/12/2016  HPI/Events of Note  Request to change from Pepcid IV to Pepcid PO.   eICU Interventions  Will change Pepcid IV to Pepcid PO.     Intervention Category Minor Interventions: Routine modifications to care plan (e.g. PRN medications for pain, fever)  Sydney Jacobs 07/12/2016, 9:24 PM

## 2016-07-12 NOTE — Progress Notes (Signed)
PT Cancellation Note  Patient Details Name: Sydney Jacobs MRN: [id number] DOB: [birth year]   Cancelled Treatment:    Reason Eval/Treat Not Completed: Medical issues which prohibited therapy (per nsg, hold PT eval today as pt just off bipap and still on lasix drip, nsg asked to please defer evaluation and mobility until tomorrow)   Fabio AsaWerner, Deontra Pereyra J 07/12/2016, 3:09 PM Charlotte Crumbevon Yeshua Stryker, PT DPT  [phone]

## 2016-07-12 NOTE — Progress Notes (Signed)
PULMONARY / CRITICAL CARE MEDICINE   Name: Sydney Jacobs MRN: [id number] DOB: 1934/05/24    ADMISSION DATE:  07/10/2016 CONSULTATION DATE:  9/4  REFERRING MD:  Regalado ([facility])   CHIEF COMPLAINT:  Respiratory failure   HISTORY OF PRESENT ILLNESS:   82yo female with multiple medical problems including chronic respiratory failure on the basis of dCHF and COPD on home O2, CKD III,  pulmonary nodule, PAF, recent cath without significant CAD c/b retroperitoneal bleed post procedure.  Presented 9/3 with worsening SOB.  She was admitted by [facility] to SDU on bipap with acute on chronic respiratory failure r/t acute on chronic CHF +/- COPD.  On 9/4 she remained extremely SOB, failed brief trial off bipap and PCCM consulted.   Currently comfortable on bipap.  States dyspnea is "much better".  Denies purulent sputum, chest pain, hemoptysis.   SUBJECTIVE:  Remains on BiPAP, transferred to the ICU for acute on chronic respiratory failure.  VITAL SIGNS: BP 104/88   Pulse 76   Temp 97.4 F (36.3 C) (Axillary)   Resp (!) 21   Ht 4\' 10"  (1.473 m)   Wt 53.2 kg (117 lb 4.6 oz)   SpO2 98%   BMI 24.51 kg/m   HEMODYNAMICS:    VENTILATOR SETTINGS: FiO2 (%):  [40 %-60 %] 40 %  INTAKE / OUTPUT: I/O last 3 completed shifts: In: 328.6 [P.O.:120; I.V.:158.6; IV Piggyback:50] Out: 1890 [Urine:1890]  PHYSICAL EXAMINATION: General:  Chronically ill appearing female, NAD on bipap  Neuro:  Drowsy but easily arousable, follows commands, answers questions appropriately  HEENT:  Mm moist, bipap  Cardiovascular:  s1s2 irreg Lungs:  resps even, non labored on bipap, diminished L base, crackles throughout L>R  Abdomen:  Round, soft, non tender  Musculoskeletal:  Warm and dry, scant BLE edema  LABS:  BMET  Recent Labs Lab 07/10/16 1957 07/11/16 0250 07/11/16 1648 07/12/16 0458  NA 141 142  --  144  K 2.8* 3.0* 3.5 3.8  CL 100* 102  --  103  CO2 28 31  --  30  BUN 58* 54*  --  60*   CREATININE 2.25* 2.21*  --  2.35*  GLUCOSE 130* 128*  --  156*   Electrolytes  Recent Labs Lab 07/10/16 1957 07/11/16 0250 07/11/16 1648 07/12/16 0458  CALCIUM 8.1* 7.8*  --  8.4*  MG  --   --  1.3* 2.1   CBC  Recent Labs Lab 07/06/16 1139 07/10/16 1957 07/12/16 0458  WBC 10.0 7.4 5.5  HGB 10.0* 9.3* 8.4*  HCT 30.7* 28.9* 26.8*  PLT 172 205 178   Coag's  Recent Labs Lab 07/10/16 1957  INR 1.04   Sepsis Markers  Recent Labs Lab 07/10/16 2012 07/10/16 2254 07/10/16 2300 07/12/16 0458  LATICACIDVEN 0.84  --  0.51  --   PROCALCITON  --  0.13  --  0.21   ABG  Recent Labs Lab 07/10/16 2015  PHART 7.450  PCO2ART 46.1  PO2ART 144*   Liver Enzymes  Recent Labs Lab 07/10/16 1957  AST 12*  ALT 11*  ALKPHOS 51  BILITOT 0.8  ALBUMIN 3.2*   Cardiac Enzymes  Recent Labs Lab 07/11/16 0250 07/11/16 0813 07/11/16 1447  TROPONINI 0.04* 0.04* 0.04*   Glucose  Recent Labs Lab 07/11/16 1857  GLUCAP 110*   Imaging Dg Chest Port 1 View  Result Date: 07/12/2016 CLINICAL DATA:  Acute respiratory failure, history of CHF, COPD, chronic renal insufficiency EXAM: PORTABLE CHEST 1 VIEW COMPARISON:  Portable chest x-ray of July 11, 2016 FINDINGS: The lungs are adequately inflated. The left hemidiaphragm remains obscured. There is some obscuration of the right hemidiaphragm now. The pulmonary interstitial markings are increased bilaterally. There are confluent alveolar opacities in the left upper lobe. The heart is normal in size. There is calcification in the wall of the aortic arch. The trachea is midline. The bony thorax exhibits no acute abnormality. IMPRESSION: Slight interval worsening of pulmonary interstitial edema and bilateral pleural effusions. Persistent pulmonary vascular engorgement. Aortic atherosclerosis. Electronically Signed   By: David  [place]il[place]Jordan M.D.   On: 07/12/2016 07:12   Dg Chest Port 1 View  Result Date: 07/11/2016 CLINICAL DATA:  Acute  on chronic respiratory failure. History of COPD. Worsening shortness of breath. EXAM: PORTABLE CHEST 1 VIEW COMPARISON:  Single-view of the chest 07/10/2016 and 06/14/2016. FINDINGS: Left worse than right airspace disease persists appearing aeration improved improved on the right since the most recent exam. Left pleural effusion is again identified. Heart size is upper normal. Aortic atherosclerosis is seen. IMPRESSION: Extensive bilateral airspace disease shows improvement on the right since the most recent exam. No new abnormality. Left pleural effusion. Electronically Signed   By: Drusilla Kannerhomas  Dalessio M.D.   On: 07/11/2016 19:34   STUDIES:  2D echo 8/7>>> EF 60-65%, grade 2 diastolic dysfunction, mod MR  CULTURES:   ANTIBIOTICS:   SIGNIFICANT EVENTS:   LINES/TUBES:   DISCUSSION: 82yo female with acute on chronic respiratory failure r/t decompensated heart failure with underlying COPD.   ASSESSMENT / PLAN:  PULMONARY Acute on chronic respiratory failure - r/t decompensated diastolic heart failure with underlying COPD  Bilateral Pleural effusion L>R COPD without acute exacerbation  Known pulmonary nodule  P:   BiPAP to PRN. Diuresis per cards  Duonebs  D/C systemic steroids  F/u CXR as needed.  CARDIOVASCULAR Acute on chronic diastolic heart failure.  More frequent exacerbations recently.  BNP elevated.  Recently changed to torsemide as outpt.  AFib with RVR  HTN  P:  Cardiology following  Lasix drip 10 mg/hr x24 hours more. Continue bipap as above. Lopressor  Consider anticoagulation per cards  BP control  Trend troponin  F/u BNP   RENAL CKD IV  Hypokalemia  P:   Replete electrolytes as needed. F/u chem  KVO IVF Lasix 10 mgh/hr.  GASTROINTESTINAL No active issue  P:   Heart healthy diet. Fluid restriction to 1L per day. Protonix.  HEMATOLOGIC No active issue  P:  Anticoagulation per cards  F/u CBC   INFECTIOUS No active issue  P:   Monitor WBC,  fever curve off abx   ENDOCRINE Hyperglycemia - no known hx DM   P:   Monitor glucose on chem   NEUROLOGIC No active issue  P:   Monitor mental status on bipap  Supportive care   FAMILY  - Updates: Patient and family updated bedside.  Will attempt to get off BiPAP, on 4L at home.  If tolerated [facility].  - Inter-disciplinary family meet or Palliative Care meeting due by:  9/11  The patient is critically ill with multiple organ systems failure and requires high complexity decision making for assessment and support, frequent evaluation and titration of therapies, application of advanced monitoring technologies and extensive interpretation of multiple databases.   Critical Care Time devoted to patient care services described in this note is  35  Minutes. This time reflects time of care of this  signee Dr Jennet Maduro. This critical care time does not reflect procedure time, or teaching time or supervisory time of PA/NP/Med student/Med Resident etc but could involve care discussion time.  Rush Farmer, M.D. [facility] Pulmonary/Critical Care Medicine. Pager: [phone]. After hours pager: [phone].

## 2016-07-12 NOTE — Progress Notes (Signed)
Patient transfer to ICU, CCM will take over patient care, also cardiology following. Discussed with Dr Molli KnockYacoub. Please call [facility] when needed.

## 2016-07-12 NOTE — Progress Notes (Signed)
Patient is tolerating 4L nasal cannula at this time. RT will continue to monitor as needed. BiPAP not indicated at this time.

## 2016-07-13 ENCOUNTER — Inpatient Hospital Stay (HOSPITAL_COMMUNITY): Payer: Commercial Managed Care - HMO

## 2016-07-13 DIAGNOSIS — I4891 Unspecified atrial fibrillation: Secondary | ICD-10-CM

## 2016-07-13 DIAGNOSIS — D649 Anemia, unspecified: Secondary | ICD-10-CM

## 2016-07-13 DIAGNOSIS — E876 Hypokalemia: Secondary | ICD-10-CM

## 2016-07-13 LAB — BASIC METABOLIC PANEL
ANION GAP: 10 (ref 5–15)
Anion gap: 16 — ABNORMAL HIGH (ref 5–15)
BUN: 73 mg/dL — ABNORMAL HIGH (ref 6–20)
BUN: 69 mg/dL — ABNORMAL HIGH (ref 6–20)
CALCIUM: 8.5 mg/dL — AB (ref 8.9–10.3)
CHLORIDE: 95 mmol/L — AB (ref 101–111)
CO2: 31 mmol/L (ref 22–32)
CO2: 32 mmol/L (ref 22–32)
CREATININE: 2.47 mg/dL — AB (ref 0.44–1.00)
CREATININE: 2.44 mg/dL — AB (ref 0.44–1.00)
Calcium: 8.1 mg/dL — ABNORMAL LOW (ref 8.9–10.3)
Chloride: 100 mmol/L — ABNORMAL LOW (ref 101–111)
GFR calc Af Amer: 21 mL/min — ABNORMAL LOW (ref >60)
GFR calc non Af Amer: 18 mL/min — ABNORMAL LOW (ref >60)
GFR, EST AFRICAN AMERICAN: 20 mL/min — AB (ref >60)
GFR, EST NON AFRICAN AMERICAN: 17 mL/min — AB (ref >60)
GLUCOSE: 143 mg/dL — AB (ref 65–99)
Glucose, Bld: 169 mg/dL — ABNORMAL HIGH (ref 65–99)
Potassium: 3 mmol/L — ABNORMAL LOW (ref 3.5–5.1)
Potassium: 4.1 mmol/L (ref 3.5–5.1)
SODIUM: 142 mmol/L (ref 135–145)
Sodium: 142 mmol/L (ref 135–145)

## 2016-07-13 LAB — CBC
HCT: 24.3 % — ABNORMAL LOW (ref 36.0–46.0)
HEMATOCRIT: 31.2 % — AB (ref 36.0–46.0)
Hemoglobin: 7.7 g/dL — ABNORMAL LOW (ref 12.0–15.0)
Hemoglobin: 10 g/dL — ABNORMAL LOW (ref 12.0–15.0)
MCH: 28 pg (ref 26.0–34.0)
MCH: 27.9 pg (ref 26.0–34.0)
MCHC: 31.7 g/dL (ref 30.0–36.0)
MCHC: 32.1 g/dL (ref 30.0–36.0)
MCV: 88.4 fL (ref 78.0–100.0)
MCV: 86.9 fL (ref 78.0–100.0)
PLATELETS: 183 10*3/uL (ref 150–400)
Platelets: 221 10*3/uL (ref 150–400)
RBC: 2.75 MIL/uL — AB (ref 3.87–5.11)
RBC: 3.59 MIL/uL — ABNORMAL LOW (ref 3.87–5.11)
RDW: 15.7 % — ABNORMAL HIGH (ref 11.5–15.5)
RDW: 15.8 % — AB (ref 11.5–15.5)
WBC: 10.3 10*3/uL (ref 4.0–10.5)
WBC: 13.3 10*3/uL — ABNORMAL HIGH (ref 4.0–10.5)

## 2016-07-13 LAB — BLOOD GAS, ARTERIAL
ACID-BASE EXCESS: 9 mmol/L — AB (ref 0.0–2.0)
BICARBONATE: 33.1 mmol/L — AB (ref 20.0–28.0)
DRAWN BY: 437071
FIO2: 36
O2 Content: 4 L/min
O2 Saturation: 95.3 %
PATIENT TEMPERATURE: 98.6
PO2 ART: 74 mmHg — AB (ref 83.0–108.0)
pCO2 arterial: 46.7 mmHg (ref 32.0–48.0)
pH, Arterial: 7.465 — ABNORMAL HIGH (ref 7.350–7.450)

## 2016-07-13 LAB — PHOSPHORUS: Phosphorus: 4.1 mg/dL (ref 2.5–4.6)

## 2016-07-13 LAB — PREPARE RBC (CROSSMATCH)

## 2016-07-13 LAB — MAGNESIUM: MAGNESIUM: 2.1 mg/dL (ref 1.7–2.4)

## 2016-07-13 MED ORDER — HYDRALAZINE HCL 20 MG/ML IJ SOLN: 10.0000 mg | Freq: Four times a day (QID) | INTRAMUSCULAR | Status: DC | PRN

## 2016-07-13 MED ORDER — AMIODARONE HCL IN DEXTROSE 360-4.14 MG/200ML-% IV SOLN
60.0000 mg/h | INTRAVENOUS | Status: AC
  Administered 2016-07-13: 60 mg/h via INTRAVENOUS
  Filled 2016-07-13: qty 200

## 2016-07-13 MED ORDER — AMIODARONE HCL IN DEXTROSE 360-4.14 MG/200ML-% IV SOLN
30.0000 mg/h | INTRAVENOUS | Status: DC
  Administered 2016-07-13 – 2016-07-21 (×15): 30 mg/h via INTRAVENOUS
  Filled 2016-07-13 (×17): qty 200

## 2016-07-13 MED ORDER — SODIUM CHLORIDE 0.9 % IV SOLN
Freq: Once | INTRAVENOUS | Status: AC
  Administered 2016-07-13: 14:00:00 via INTRAVENOUS

## 2016-07-13 MED ORDER — AMIODARONE LOAD VIA INFUSION
150.0000 mg | Freq: Once | INTRAVENOUS | Status: AC
  Administered 2016-07-13: 150 mg via INTRAVENOUS
  Filled 2016-07-13: qty 83.34

## 2016-07-13 MED ORDER — POTASSIUM CHLORIDE CRYS ER 20 MEQ PO TBCR
40.0000 meq | EXTENDED_RELEASE_TABLET | Freq: Once | ORAL | Status: DC
  Filled 2016-07-13: qty 2

## 2016-07-13 MED ORDER — AMIODARONE HCL IN DEXTROSE 360-4.14 MG/200ML-% IV SOLN
INTRAVENOUS | Status: AC
  Filled 2016-07-13: qty 200

## 2016-07-13 MED ORDER — IPRATROPIUM BROMIDE 0.02 % IN SOLN
0.5000 mg | Freq: Three times a day (TID) | RESPIRATORY_TRACT | Status: DC
Start: 2016-07-13 — End: 2016-07-14
  Administered 2016-07-13 – 2016-07-14 (×2): 0.5 mg via RESPIRATORY_TRACT
  Filled 2016-07-13 (×3): qty 2.5

## 2016-07-13 MED ORDER — LEVALBUTEROL HCL 1.25 MG/0.5ML IN NEBU
1.2500 mg | INHALATION_SOLUTION | Freq: Three times a day (TID) | RESPIRATORY_TRACT | Status: DC
  Administered 2016-07-13 – 2016-07-14 (×2): 1.25 mg via RESPIRATORY_TRACT
  Filled 2016-07-13 (×3): qty 0.5

## 2016-07-13 MED ORDER — POTASSIUM CHLORIDE CRYS ER 20 MEQ PO TBCR
40.0000 meq | EXTENDED_RELEASE_TABLET | Freq: Two times a day (BID) | ORAL | Status: DC
  Administered 2016-07-13 (×2): 40 meq via ORAL
  Filled 2016-07-13 (×2): qty 2

## 2016-07-13 MED ORDER — CHLORHEXIDINE GLUCONATE 0.12 % MT SOLN
15.0000 mL | Freq: Two times a day (BID) | OROMUCOSAL | Status: DC
  Administered 2016-07-13 – 2016-07-14 (×3): 15 mL via OROMUCOSAL
  Filled 2016-07-13: qty 15

## 2016-07-13 MED ORDER — ORAL CARE MOUTH RINSE
15.0000 mL | Freq: Four times a day (QID) | OROMUCOSAL | Status: DC
  Administered 2016-07-14 (×2): 15 mL via OROMUCOSAL

## 2016-07-13 NOTE — Progress Notes (Signed)
PRN neb given due to patient requesting for SOB. Nurse aware and at bedside. Will continue to monitor pt.

## 2016-07-13 NOTE — Progress Notes (Signed)
MD ordered to only give 1of the 2 units of PRBC ordered.

## 2016-07-13 NOTE — Progress Notes (Addendum)
PROGRESS NOTE  Sydney Jacobs EXB:[id number] DOB: August 25, 1934 DOA: 07/10/2016 PCP: Colette RibasGOLDING, JOHN CABOT, MD  HPI/Recap of past 24 hours:  C/o dyspnea, back on bipap, in afib/rvr, started on amiodarone drip  Assessment/Plan: Principal Problem:   Acute on chronic diastolic congestive heart failure (HCC) Active Problems:   Elevated troponin   Chronic kidney disease (CKD), stage IV (severe) (HCC)   Essential hypertension   COPD (chronic obstructive pulmonary disease) (HCC)   Retroperitoneal hematoma in 8/17   PAF (paroxysmal atrial fibrillation) (HCC)   AAA (abdominal aortic aneurysm) (HCC)   Chronic respiratory failure (HCC)  Acute on chronic hypoxic respiratory failure ( was started on home o2 a few weeks ago), Acute dyspnea: was on bipap 9/4 and 9/5, back on bipap on 9/6, multifactorial including chf exacerbation in the setting of uncontrolled afib, transfer to ICU   Afib/rvr: started on amiodarone drip, not a candidate for anticoagulation due to r/o retroperitoneal hemorrhage.   Acute on chronic anemia: no obvious sign of bleed, transfuse 2units prbc, get STAT CT ab/pel to r/o retroperitoneal bleed. Check FOBT  Hypokalemia: replace k, mag 2.1  AkI on ckdIII:  worsening of cr, she is on lasix drip, cardiology to manage lasix drip  H/o HTN: bp borderline, d/c iv hydralazine, d/c norvasc, continue lopressor with holding parameters  Copd: currently no wheezing, continue nebs   Code Status: full code  Family Communication: patient  And husband at bedside  Disposition Plan: transfer to icu   Consultants:  [facility] admit on 9/3, transferred to icu on 9/4, [facility], patient went back to icu on 9/6,   cardiology  Procedures:  bipap  Antibiotics:  none   Objective: BP 121/64 (BP Location: Right Arm)   Pulse 90   Temp 98 F (36.7 C) (Oral)   Resp (!) 21   Ht 4\' 10"  (1.473 m)   Wt 55.5 kg (122 lb 4.8 oz)   SpO2 98%   BMI  25.56 kg/m   Intake/Output Summary (Last 24 hours) at 07/13/16 0839 Last data filed at 07/13/16 0600  Gross per 24 hour  Intake              633 ml  Output              975 ml  Net             -342 ml   Filed Weights   07/11/16 2100 07/12/16 0600 07/13/16 0400  Weight: 53.3 kg (117 lb 8.1 oz) 53.2 kg (117 lb 4.6 oz) 55.5 kg (122 lb 4.8 oz)    Exam:   General:  Dyspnea, no bipap  Cardiovascular: IRRR  Respiratory: bilateral crackles, no significant wheezing, no rhonchi  Abdomen: Soft/ND/NT, positive BS  Musculoskeletal: No Edema  Neuro: slight confusion  Data Reviewed: Basic Metabolic Panel:  Recent Labs Lab 07/06/16 1139 07/10/16 1957 07/11/16 0250 07/11/16 1648 07/12/16 0458 07/13/16 0416  NA 145 141 142  --  144 142  K 3.8 2.8* 3.0* 3.5 3.8 3.0*  CL 101 100* 102  --  103 95*  CO2 28 28 31   --  30 31  GLUCOSE 141* 130* 128*  --  156* 169*  BUN 50* 58* 54*  --  60* 73*  CREATININE 1.77* 2.25* 2.21*  --  2.35* 2.47*  CALCIUM 8.2* 8.1* 7.8*  --  8.4* 8.1*  MG  --   --   --  1.3* 2.1 2.1  PHOS  --   --   --   --   --  4.1   Liver Function Tests:  Recent Labs Lab 07/10/16 1957  AST 12*  ALT 11*  ALKPHOS 51  BILITOT 0.8  PROT 5.6*  ALBUMIN 3.2*   No results for input(s): LIPASE, AMYLASE in the last 168 hours. No results for input(s): AMMONIA in the last 168 hours. CBC:  Recent Labs Lab 07/06/16 1139 07/10/16 1957 07/12/16 0458 07/13/16 0416  WBC 10.0 7.4 5.5 10.3  NEUTROABS 9,000* 5.8  --   --   HGB 10.0* 9.3* 8.4* 7.7*  HCT 30.7* 28.9* 26.8* 24.3*  MCV 85.5 88.4 89.0 88.4  PLT 172 205 178 183   Cardiac Enzymes:    Recent Labs Lab 07/10/16 1957 07/11/16 0250 07/11/16 0813 07/11/16 1447  TROPONINI 0.03* 0.04* 0.04* 0.04*   BNP (last 3 results)  Recent Labs  05/23/16 1330 06/12/16 1237 07/10/16 1957  BNP 1,216.0* 2,185.0* 1,649.0*    ProBNP (last 3 results) No results for input(s): PROBNP in the last 8760  hours.  CBG:  Recent Labs Lab 07/11/16 1857  GLUCAP 110*    Recent Results (from the past 240 hour(s))  MRSA PCR Screening     Status: None   Collection Time: 07/11/16  1:16 AM  Result Value Ref Range Status   MRSA by PCR NEGATIVE NEGATIVE Final    Comment:        The GeneXpert MRSA Assay (FDA approved for NASAL specimens only), is one component of a comprehensive MRSA colonization surveillance program. It is not intended to diagnose MRSA infection nor to guide or monitor treatment for MRSA infections.      Studies: Dg Chest Port 1 View  Result Date: 07/13/2016 CLINICAL DATA:  Congestive heart failure EXAM: PORTABLE CHEST 1 VIEW COMPARISON:  July 12, 2016 FINDINGS: There is widespread interstitial edema with left pleural effusion. There is patchy airspace consolidation in the medial bases bilaterally. There is cardiomegaly with pulmonary venous hypertension. There is atherosclerotic calcification in the aorta. There is no new opacity evident. IMPRESSION: Findings indicative of congestive heart failure without appreciable change from 1 day prior. Stable cardiac silhouette. There is aortic atherosclerosis. Electronically Signed   By: Bretta Bang III M.D.   On: 07/13/2016 07:27    Scheduled Meds: . atorvastatin  20 mg Oral q1800  . famotidine  20 mg Oral QHS  . furosemide  40 mg Intravenous Once  . ipratropium-albuterol  3 mL Nebulization TID  . mouth rinse  15 mL Mouth Rinse BID  . metoprolol  5 mg Intravenous Q6H  . sodium chloride flush  3 mL Intravenous Q12H    Continuous Infusions: . furosemide (LASIX) infusion 10 mg/hr (07/12/16 2025)  . nitroGLYCERIN Stopped (07/12/16 0507)     Time spent:  Sheniece Ruggles MD, PhD  [facility] Pager [phone]. If 7PM-7AM, please contact night-coverage at [email or web address], password [facility] 07/13/2016, 8:39 AM  LOS: 3 days

## 2016-07-13 NOTE — Progress Notes (Signed)
PULMONARY / CRITICAL CARE MEDICINE   Name: Sydney Jacobs MRN: [id number] DOB: 1934-03-24    ADMISSION DATE:  07/10/2016 CONSULTATION DATE:  9/4  REFERRING MD:  [facility] ([facility])   CHIEF COMPLAINT:  Respiratory failure   HISTORY OF PRESENT ILLNESS:   82yo female with multiple medical problems including chronic respiratory failure on the basis of dCHF and COPD on home O2, CKD III,  pulmonary nodule, PAF, recent cath without significant CAD c/b retroperitoneal bleed post procedure.  Presented 9/3 with worsening SOB.  She was admitted by [facility] to SDU on bipap with acute on chronic respiratory failure r/t acute on chronic CHF +/- COPD.  On 9/4 she remained extremely SOB, failed brief trial off bipap and PCCM consulted.   Currently comfortable on bipap.  States dyspnea is "much better".  Denies purulent sputum, chest pain, hemoptysis.  9/6 PCCM called back for Dyspnea, Afib RVR, anemia.  SUBJECTIVE:  Back on BiPAP, currently on SDU, most likely needs to go back to ICU.  VITAL SIGNS: BP 109/86   Pulse (!) 141   Temp 98 F (36.7 C) (Oral)   Resp 19   Ht 4\' 10"  (1.473 m)   Wt 122 lb 4.8 oz (55.5 kg)   SpO2 96%   BMI 25.56 kg/m   HEMODYNAMICS:    VENTILATOR SETTINGS: FiO2 (%):  [45 %] 45 %  INTAKE / OUTPUT: I/O last 3 completed shifts: In: 852.9 [P.O.:410; I.V.:392.9; IV Piggyback:50] Out: 2075 [Urine:2075]  PHYSICAL EXAMINATION: General:  Chronically ill appearing female, NAD on bipap  Neuro:  Drowsy but easily arousable, follows commands, answers questions appropriately  HEENT:  Mm moist, bipap  Cardiovascular:  s1s2 irreg Afib with HR 120-160 on amio drip Lungs:  resps even, non labored on bipap, diminished L base, crackles throughout L>R  Abdomen:  Round, soft, non tender  Musculoskeletal:  Warm and dry, scant BLE edema  LABS:  BMET  Recent Labs Lab 07/11/16 0250 07/11/16 1648 07/12/16 0458 07/13/16 0416  NA 142  --  144 142  K 3.0* 3.5 3.8 3.0*  CL 102  --   103 95*  CO2 31  --  30 31  BUN 54*  --  60* 73*  CREATININE 2.21*  --  2.35* 2.47*  GLUCOSE 128*  --  156* 169*   Electrolytes  Recent Labs Lab 07/11/16 0250 07/11/16 1648 07/12/16 0458 07/13/16 0416  CALCIUM 7.8*  --  8.4* 8.1*  MG  --  1.3* 2.1 2.1  PHOS  --   --   --  4.1   CBC  Recent Labs Lab 07/10/16 1957 07/12/16 0458 07/13/16 0416  WBC 7.4 5.5 10.3  HGB 9.3* 8.4* 7.7*  HCT 28.9* 26.8* 24.3*  PLT 205 178 183   Coag's  Recent Labs Lab 07/10/16 1957  INR 1.04   Sepsis Markers  Recent Labs Lab 07/10/16 2012 07/10/16 2254 07/10/16 2300 07/12/16 0458  LATICACIDVEN 0.84  --  0.51  --   PROCALCITON  --  0.13  --  0.21   ABG  Recent Labs Lab 07/10/16 2015 07/13/16 0335  PHART 7.450 7.465*  PCO2ART 46.1 46.7  PO2ART 144* 74.0*   Liver Enzymes  Recent Labs Lab 07/10/16 1957  AST 12*  ALT 11*  ALKPHOS 51  BILITOT 0.8  ALBUMIN 3.2*   Cardiac Enzymes  Recent Labs Lab 07/11/16 0250 07/11/16 0813 07/11/16 1447  TROPONINI 0.04* 0.04* 0.04*   Glucose  Recent Labs Lab 07/11/16 1857  GLUCAP 110*  Imaging Dg Chest Port 1 View  Result Date: 07/13/2016 CLINICAL DATA:  Congestive heart failure EXAM: PORTABLE CHEST 1 VIEW COMPARISON:  July 12, 2016 FINDINGS: There is widespread interstitial edema with left pleural effusion. There is patchy airspace consolidation in the medial bases bilaterally. There is cardiomegaly with pulmonary venous hypertension. There is atherosclerotic calcification in the aorta. There is no new opacity evident. IMPRESSION: Findings indicative of congestive heart failure without appreciable change from 1 day prior. Stable cardiac silhouette. There is aortic atherosclerosis. Electronically Signed   By: Bretta Bang III M.D.   On: 07/13/2016 07:27   STUDIES:  2D echo 8/7>>> EF 60-65%, grade 2 diastolic dysfunction, mod MR  CULTURES:   ANTIBIOTICS:   SIGNIFICANT  EVENTS:   LINES/TUBES:   DISCUSSION: 82yo female with acute on chronic respiratory failure r/t decompensated heart failure with underlying COPD.   ASSESSMENT / PLAN:  PULMONARY Acute on chronic respiratory failure - r/t decompensated diastolic heart failure with underlying COPD  Bilateral Pleural effusion L>R COPD without acute exacerbation  Known pulmonary nodule  9/6 back on NIMVS with AFIBRVR, ANEMIA, DIURESIS, O2 DEPENDENT COPD as contributing factors. P:   BiPAP, may need intubation. Diuresis per cards (not working) [employer] 9/6) ? cvvh Move back to go back to ICU ?CVL for cvp   CARDIOVASCULAR Acute on chronic diastolic heart failure.  More frequent exacerbations recently.  BNP elevated.  Recently changed to torsemide as outpt.  AFib with RVR 9/6 HTN  P:  Cardiology following  Continue bipap as above. Lopressor  BP control  Trend troponin  F/u BNP   RENAL Lab Results  Component Value Date   CREATININE 2.47 (H) 07/13/2016   CREATININE 2.35 (H) 07/12/2016   CREATININE 2.21 (H) 07/11/2016   CREATININE 1.77 (H) 07/06/2016   CKD IV  Hypokalemia  P:   Replete electrolytes as needed. F/u chem  KVO IVF D/C lasix.  Consider renal consult ?CVVH as lasix not working  GASTROINTESTINAL No active issue  P:   Heart healthy diet. Fluid restriction to 1L per day. Protonix.  HEMATOLOGIC  Recent Labs  07/12/16 0458 07/13/16 0416  HGB 8.4* 7.7*    Hx of RPB in past, now with worsening anemia(may be contributing factor to dyspnea) P:  No Anticoagulation F/u CBC  Tx per cards in setting CAD CT abd r/o recurrent RPB  INFECTIOUS No active issue  P:   Monitor WBC, fever curve off abx   ENDOCRINE CBG (last 3)   Recent Labs  07/11/16 1857  GLUCAP 110*     Hyperglycemia - no known hx DM   P:   Monitor glucose on chem   NEUROLOGIC No active issue , alert on NIMVS P:   Monitor mental status on bipap  Supportive care   FAMILY   - Updates: Patient and family updated bedside. Now with AFRVR with increased SOB and on NIMVS +amio drip. 9/6 Dr. Molli Knock established LCB. No CPR, NO shock, Short term intubation allowed.  Brett Canales Minor ACNP [facility] P[facility]M Pager [phone] till 3 pm If no answer page [phone] 07/13/2016, 36:39 AM  82 year old female with COPD and CHF, developed a-fib with RVR and respiratory failure.  Hg also dropped.  On exam, diffuse crackles, mild confusion.  I reviewed CXR, pulmonary edema.  Spoke with family and patient, LCB with no CPR/cardioversion/trach/peg.  Transfer to the ICU.  Hold lasix.  Continue amiodarone for rate control.  Transfuse if <7.  Will re-evaluate later, suspect  may need intubation.  The patient is critically ill with multiple organ systems failure and requires high complexity decision making for assessment and support, frequent evaluation and titration of therapies, application of advanced monitoring technologies and extensive interpretation of multiple databases.   Critical Care Time devoted to patient care services described in this note is  35  Minutes. This time reflects time of care of this signee Dr Koren BoundWesam Kelyn Ponciano. This critical care time does not reflect procedure time, or teaching time or supervisory time of PA/NP/Med student/Med Resident etc but could involve care discussion time.  Alyson ReedyWesam G. Angelica Frandsen, M.D. [facility] Pulmonary/Critical Care Medicine. Pager: [phone]. After hours pager: [phone].

## 2016-07-13 NOTE — Progress Notes (Signed)
Pt transferred to 2H06. Pollyann SavoyNicki Beasly RN given bedside report. Pt husband at bedside during pt transport. Emelda Brothershristy Abdelaziz Westenberger RN

## 2016-07-13 NOTE — Progress Notes (Signed)
Patient Name: Sydney Jacobs Date of Encounter: 07/13/2016  Hospital Problem List     Principal Problem:   Acute on chronic diastolic congestive heart failure [facility](HCC) Active Problems:   Retroperitoneal hematoma in 8/17   PAF (paroxysmal atrial fibrillation) (HCC)   Chronic respiratory failure (HCC)   Normocytic anemia   Chronic kidney disease (CKD), stage IV (severe) (HCC)   Essential hypertension   COPD (chronic obstructive pulmonary disease) (HCC)   AAA (abdominal aortic aneurysm) (HCC)   Elevated troponin   Hypokalemia    Subjective   CTSP by nsg staff 2/2 sudden onset of WCT and c/o dyspnea.  Pt w/o chest pain.  Went into rapid AF @ 8:47.  Wide complex @ baseline.  Hemodynamically stable.  Inpatient Medications    . amiodarone      . amiodarone  150 mg Intravenous Once  . atorvastatin  20 mg Oral q1800  . famotidine  20 mg Oral QHS  . furosemide  40 mg Intravenous Once  . ipratropium-albuterol  3 mL Nebulization TID  . mouth rinse  15 mL Mouth Rinse BID  . metoprolol  5 mg Intravenous Q6H  . sodium chloride flush  3 mL Intravenous Q12H    Vital Signs    Vitals:   07/13/16 0000 07/13/16 0400 07/13/16 0742 07/13/16 0907  BP: 104/70 121/64  120/63  Pulse: 89 90 90 (!) 130  Resp: (!) 23 (!) 26 (!) 21   Temp:  98.3 F (36.8 C) 98 F (36.7 C)   TempSrc:  Oral Oral   SpO2: 99% 97% 98% 96%  Weight:  122 lb 4.8 oz (55.5 kg)    Height:        Intake/Output Summary (Last 24 hours) at 07/13/16 0909 Last data filed at 07/13/16 0900  Gross per 24 hour  Intake              743 ml  Output              975 ml  Net             -232 ml   Filed Weights   07/11/16 2100 07/12/16 0600 07/13/16 0400  Weight: 117 lb 8.1 oz (53.3 kg) 117 lb 4.6 oz (53.2 kg) 122 lb 4.8 oz (55.5 kg)    Physical Exam    GEN: frail, c/o dyspnea. HEENT: normal.  Neck: Supple, JVP ~ 12 cm, no carotid bruits, or masses. Cardiac: IR, IR, distant. No clubbing, cyanosis, edema.  Radials/DP/PT  2+ and equal bilaterally.  Respiratory:  Respirations regular and unlabored, diminished breath sounds bilat with basilar crackles. GI: Soft, nontender, nondistended, BS + x 4. MS: no deformity or atrophy. Skin: warm and dry, no rash. Neuro:  Strength and sensation are intact. Psych: Normal affect.  Labs    CBC  Recent Labs  07/10/16 1957 07/12/16 0458 07/13/16 0416  WBC 7.4 5.5 10.3  NEUTROABS 5.8  --   --   HGB 9.3* 8.4* 7.7*  HCT 28.9* 26.8* 24.3*  MCV 88.4 89.0 88.4  PLT 205 178 183   Basic Metabolic Panel  Recent Labs  07/12/16 0458 07/13/16 0416  NA 144 142  K 3.8 3.0*  CL 103 95*  CO2 30 31  GLUCOSE 156* 169*  BUN 60* 73*  CREATININE 2.35* 2.47*  CALCIUM 8.4* 8.1*  MG 2.1 2.1  PHOS  --  4.1   Liver Function Tests  Recent Labs  07/10/16 1957  AST 12*  ALT  11*  ALKPHOS 51  BILITOT 0.8  PROT 5.6*  ALBUMIN 3.2*   Cardiac Enzymes  Recent Labs  07/11/16 0250 07/11/16 0813 07/11/16 1447  TROPONINI 0.04* 0.04* 0.04*    Telemetry    Was maintaining sinus with frequent pac's but converted to rapid AF @ 8:47 this am - Regular @ times with rates > 200 - cannot r/o VT though axis remains the same.  ECG    Pending this am.  Radiology    Dg Chest Port 1 View  Result Date: 07/13/2016 CLINICAL DATA:  Congestive heart failure EXAM: PORTABLE CHEST 1 VIEW COMPARISON:  July 12, 2016 FINDINGS: There is widespread interstitial edema with left pleural effusion. There is patchy airspace consolidation in the medial bases bilaterally. There is cardiomegaly with pulmonary venous hypertension. There is atherosclerotic calcification in the aorta. There is no new opacity evident. IMPRESSION: Findings indicative of congestive heart failure without appreciable change from 1 day prior. Stable cardiac silhouette. There is aortic atherosclerosis. Electronically Signed   By: Bretta Bang III M.D.   On: 07/13/2016 07:27   Dg Chest Port 1 View  Result Date:  07/12/2016 CLINICAL DATA:  Acute respiratory failure, history of CHF, COPD, chronic renal insufficiency EXAM: PORTABLE CHEST 1 VIEW COMPARISON:  Portable chest x-ray of July 11, 2016 FINDINGS: The lungs are adequately inflated. The left hemidiaphragm remains obscured. There is some obscuration of the right hemidiaphragm now. The pulmonary interstitial markings are increased bilaterally. There are confluent alveolar opacities in the left upper lobe. The heart is normal in size. There is calcification in the wall of the aortic arch. The trachea is midline. The bony thorax exhibits no acute abnormality. IMPRESSION: Slight interval worsening of pulmonary interstitial edema and bilateral pleural effusions. Persistent pulmonary vascular engorgement. Aortic atherosclerosis. Electronically Signed   By: David  [place] M.D.   On: 07/12/2016 07:12   Dg Chest Port 1 View  Result Date: 07/11/2016 CLINICAL DATA:  Acute on chronic respiratory failure. History of COPD. Worsening shortness of breath. EXAM: PORTABLE CHEST 1 VIEW COMPARISON:  Single-view of the chest 07/10/2016 and 06/14/2016. FINDINGS: Left worse than right airspace disease persists appearing aeration improved improved on the right since the most recent exam. Left pleural effusion is again identified. Heart size is upper normal. Aortic atherosclerosis is seen. IMPRESSION: Extensive bilateral airspace disease shows improvement on the right since the most recent exam. No new abnormality. Left pleural effusion. Electronically Signed   By: Drusilla Kanner M.D.   On: 07/11/2016 19:34    Patient Summary     82 y.o.female history of chronic diastolic HF, CKD III, COPD on home O2 , AAA, chest pain with recent cath 05/2016 without significant CAD, retroperitoneal bleed s/p recent cath , pulmonary nodule, with multiple admissions over the last few weeks for recurrent diastolic HF and COPD exacerbation, readmitted 9/3 with AECOPD.  Assessment & Plan    1.  Acute  on chronic respiratory failure - multifactorial in setting of acute on chronic diastolic chf and AECOPD with bilat L>R pleural effusions:  Has required BiPap PRN.  Worsening dyspnea this AM in setting of rapid AFib.  Seen by CCM.  Now off steroids.  CXR this AM with stable CHF.  Remains on lasix gtt.  Overall, minus 1.7L this admission.  Only minus 322 ml yesterday.  Wt up 5 lbs per chart - will need to reweigh on standing scale once feeling better.  May need to add metolazone or change to torsemide.  BUN/Creat up  this AM.  BiPap placed now in setting of acute dyspnea.  Amio for rapid afib, which is driving current Ss.   2.  WCT/PAF:  Pt developed recurrent rapid afib this am.  IVCD with left bundle appearance @ baseline.  She does not tolerate afib.  Amio bolus now followed by infusion.  Will have to determine long term antiarrhythmic options as amio not ideal in setting of COPD.  Poor anticoag candidate right now 2/2 ongoing anemia.  3. Normocytic Anemia:  Sp recent RP bleed following cath.  H/H down trending.  Transfusion per IM.  4.  Hypokalemia:  Supp.  5.  Essential HTN:  Currently stable.  Signed, Nicolasa Ducking NP  Rhythm reviewed and likely afib with aberrancy .  BP is fine and patient relatively asymptomatic  Pulmonary status Tenuous and hypoxia likely trigger for arrhythmia Poor inspiratory effort and basilar wheeze/crackles on exam with SEM Agree with Rx amiodarone  Charlton Haws

## 2016-07-13 NOTE — Progress Notes (Signed)
Called Rapid Response and resp therapy to assess pt increasing resp distress. Asked resp to bring Bipap. At pt bedside administering IV metoprolol for Afib RVR. While watching monitor pt HR went as high at 218. Pt still responding stating I can't breath. Ward Givenshris Berge NP on floor, asked to come eval pt. New orders given for amio. Will cont to monitor. Emelda Brothershristy Arli Bree RN

## 2016-07-13 NOTE — Progress Notes (Signed)
Pt HR110's-140's. Dr. Roda ShuttersXu notified. New orders given. Pt having increasing SOB this am. Duoneb given with some relief. Will cont to monitor. Emelda Brothershristy Sahil Milner RN

## 2016-07-13 NOTE — Care Management Important Message (Signed)
Important Message  Patient Details  Name: Sydney Jacobs MRN: [id number] Date of Birth: 16-Sep-1934   Medicare Important Message Given:  Yes    Zakary Kimura Abena 07/13/2016, 9:10 AM

## 2016-07-13 NOTE — Progress Notes (Signed)
Physical Therapy Discharge Patient Details Name: Tandy Gawlsie H Lowery MRN: [id number] DOB: [birth year] Today's Date: 07/13/2016 Time:  -     Patient discharged from PT services secondary to medical decline - will need to re-order PT to resume therapy services.Spoke with RN about this.      GP   Lyanne CoVictoria Utah Delauder, PT  Acute Rehab Services  [phone]   Lyanne CoManess, Hawken Bielby 07/13/2016, 11:14 AM

## 2016-07-14 ENCOUNTER — Inpatient Hospital Stay (HOSPITAL_COMMUNITY): Payer: Commercial Managed Care - HMO

## 2016-07-14 DIAGNOSIS — J9601 Acute respiratory failure with hypoxia: Secondary | ICD-10-CM

## 2016-07-14 DIAGNOSIS — J810 Acute pulmonary edema: Secondary | ICD-10-CM

## 2016-07-14 LAB — CBC
HEMATOCRIT: 30.1 % — AB (ref 36.0–46.0)
Hemoglobin: 9.8 g/dL — ABNORMAL LOW (ref 12.0–15.0)
MCH: 28.2 pg (ref 26.0–34.0)
MCHC: 32.6 g/dL (ref 30.0–36.0)
MCV: 86.7 fL (ref 78.0–100.0)
Platelets: 184 10*3/uL (ref 150–400)
RBC: 3.47 MIL/uL — ABNORMAL LOW (ref 3.87–5.11)
RDW: 16.1 % — AB (ref 11.5–15.5)
WBC: 9.9 10*3/uL (ref 4.0–10.5)

## 2016-07-14 LAB — POCT I-STAT 3, ART BLOOD GAS (G3+)
ACID-BASE EXCESS: 13 mmol/L — AB (ref 0.0–2.0)
Bicarbonate: 39.2 mmol/L — ABNORMAL HIGH (ref 20.0–28.0)
O2 SAT: 100 %
PCO2 ART: 54.6 mmHg — AB (ref 32.0–48.0)
PO2 ART: 272 mmHg — AB (ref 83.0–108.0)
Patient temperature: 98.1
TCO2: 41 mmol/L (ref 0–100)
pH, Arterial: 7.463 — ABNORMAL HIGH (ref 7.350–7.450)

## 2016-07-14 LAB — BLOOD GAS, ARTERIAL
ACID-BASE EXCESS: 8.3 mmol/L — AB (ref 0.0–2.0)
BICARBONATE: 32.2 mmol/L — AB (ref 20.0–28.0)
Delivery systems: POSITIVE
Drawn by: 454021
EXPIRATORY PAP: 6
FIO2: 45
Inspiratory PAP: 12
O2 SAT: 94.9 %
PATIENT TEMPERATURE: 97.7
PCO2 ART: 42.4 mmHg (ref 32.0–48.0)
PH ART: 7.49 — AB (ref 7.350–7.450)
PO2 ART: 71.4 mmHg — AB (ref 83.0–108.0)
RATE: 10 resp/min

## 2016-07-14 LAB — BASIC METABOLIC PANEL
Anion gap: 12 (ref 5–15)
BUN: 67 mg/dL — ABNORMAL HIGH (ref 6–20)
CHLORIDE: 100 mmol/L — AB (ref 101–111)
CO2: 30 mmol/L (ref 22–32)
CREATININE: 2.36 mg/dL — AB (ref 0.44–1.00)
Calcium: 8.5 mg/dL — ABNORMAL LOW (ref 8.9–10.3)
GFR calc non Af Amer: 18 mL/min — ABNORMAL LOW (ref >60)
GFR, EST AFRICAN AMERICAN: 21 mL/min — AB (ref >60)
Glucose, Bld: 126 mg/dL — ABNORMAL HIGH (ref 65–99)
Potassium: 4.2 mmol/L (ref 3.5–5.1)
Sodium: 142 mmol/L (ref 135–145)

## 2016-07-14 LAB — OCCULT BLOOD X 1 CARD TO LAB, STOOL: Fecal Occult Bld: POSITIVE — AB

## 2016-07-14 LAB — PHOSPHORUS
Phosphorus: 3.8 mg/dL (ref 2.5–4.6)
Phosphorus: 3.9 mg/dL (ref 2.5–4.6)

## 2016-07-14 LAB — GLUCOSE, CAPILLARY: GLUCOSE-CAPILLARY: 195 mg/dL — AB (ref 65–99)

## 2016-07-14 LAB — MAGNESIUM
Magnesium: 2 mg/dL (ref 1.7–2.4)
Magnesium: 2 mg/dL (ref 1.7–2.4)

## 2016-07-14 LAB — PROCALCITONIN: Procalcitonin: 0.19 ng/mL

## 2016-07-14 MED ORDER — INSULIN ASPART 100 UNIT/ML ~~LOC~~ SOLN
0.0000 [IU] | SUBCUTANEOUS | Status: DC
  Administered 2016-07-14: 2 [IU] via SUBCUTANEOUS
  Administered 2016-07-15: 1 [IU] via SUBCUTANEOUS
  Administered 2016-07-15 (×4): 2 [IU] via SUBCUTANEOUS
  Administered 2016-07-15: 1 [IU] via SUBCUTANEOUS
  Administered 2016-07-16: 2 [IU] via SUBCUTANEOUS
  Administered 2016-07-16: 1 [IU] via SUBCUTANEOUS
  Administered 2016-07-16: 2 [IU] via SUBCUTANEOUS
  Administered 2016-07-16: 1 [IU] via SUBCUTANEOUS
  Administered 2016-07-16: 2 [IU] via SUBCUTANEOUS
  Administered 2016-07-16: 3 [IU] via SUBCUTANEOUS
  Administered 2016-07-17 (×4): 2 [IU] via SUBCUTANEOUS
  Administered 2016-07-17 – 2016-07-18 (×2): 1 [IU] via SUBCUTANEOUS
  Administered 2016-07-18: 2 [IU] via SUBCUTANEOUS
  Administered 2016-07-18: 1 [IU] via SUBCUTANEOUS
  Administered 2016-07-18 (×3): 2 [IU] via SUBCUTANEOUS
  Administered 2016-07-19: 5 [IU] via SUBCUTANEOUS
  Administered 2016-07-19: 2 [IU] via SUBCUTANEOUS
  Administered 2016-07-19 – 2016-07-20 (×4): 1 [IU] via SUBCUTANEOUS
  Administered 2016-07-20: 2 [IU] via SUBCUTANEOUS
  Administered 2016-07-20: 1 [IU] via SUBCUTANEOUS
  Administered 2016-07-20: 2 [IU] via SUBCUTANEOUS
  Administered 2016-07-20: 1 [IU] via SUBCUTANEOUS
  Administered 2016-07-21: 2 [IU] via SUBCUTANEOUS
  Administered 2016-07-21: 1 [IU] via SUBCUTANEOUS
  Administered 2016-07-21: 2 [IU] via SUBCUTANEOUS

## 2016-07-14 MED ORDER — FENTANYL CITRATE (PF) 100 MCG/2ML IJ SOLN
100.0000 ug | Freq: Once | INTRAMUSCULAR | Status: AC
  Administered 2016-07-14: 100 ug via INTRAVENOUS

## 2016-07-14 MED ORDER — LEVALBUTEROL HCL 1.25 MG/0.5ML IN NEBU
1.2500 mg | INHALATION_SOLUTION | Freq: Three times a day (TID) | RESPIRATORY_TRACT | Status: DC
  Administered 2016-07-14 – 2016-07-21 (×22): 1.25 mg via RESPIRATORY_TRACT
  Filled 2016-07-14 (×21): qty 0.5

## 2016-07-14 MED ORDER — PRO-STAT SUGAR FREE PO LIQD
30.0000 mL | Freq: Every day | ORAL | Status: DC
  Administered 2016-07-14 – 2016-07-21 (×8): 30 mL
  Filled 2016-07-14 (×8): qty 30

## 2016-07-14 MED ORDER — METOLAZONE 5 MG PO TABS
10.0000 mg | ORAL_TABLET | Freq: Once | ORAL | Status: AC
  Administered 2016-07-14: 10 mg via ORAL
  Filled 2016-07-14: qty 2

## 2016-07-14 MED ORDER — ROCURONIUM BROMIDE 50 MG/5ML IV SOLN
5.0000 mg/kg | Freq: Once | INTRAVENOUS | Status: DC
  Filled 2016-07-14: qty 27

## 2016-07-14 MED ORDER — ETOMIDATE 2 MG/ML IV SOLN
10.0000 mg | Freq: Once | INTRAVENOUS | Status: AC
Start: 2016-07-14 — End: 2016-07-14
  Administered 2016-07-14: 10 mg via INTRAVENOUS

## 2016-07-14 MED ORDER — SODIUM CHLORIDE 0.9 % IV SOLN
25.0000 ug/h | INTRAVENOUS | Status: DC
  Administered 2016-07-14: 50 ug/h via INTRAVENOUS
  Administered 2016-07-14: 25 ug/h via INTRAVENOUS
  Administered 2016-07-15: 200 ug/h via INTRAVENOUS
  Administered 2016-07-16 – 2016-07-19 (×3): 75 ug/h via INTRAVENOUS
  Administered 2016-07-22: 50 ug/h via INTRAVENOUS
  Administered 2016-07-24: 75 ug/h via INTRAVENOUS
  Administered 2016-07-25: 80 ug/h via INTRAVENOUS
  Administered 2016-07-26: 130 ug/h via INTRAVENOUS
  Filled 2016-07-14 (×10): qty 50

## 2016-07-14 MED ORDER — HYDRALAZINE HCL 20 MG/ML IJ SOLN: 10.0000 mg | Freq: Four times a day (QID) | INTRAMUSCULAR | Status: DC | PRN

## 2016-07-14 MED ORDER — MIDAZOLAM HCL 2 MG/2ML IJ SOLN
INTRAMUSCULAR | Status: AC
  Administered 2016-07-14: 2 mg via INTRAVENOUS
  Filled 2016-07-14: qty 2

## 2016-07-14 MED ORDER — ORAL CARE MOUTH RINSE
15.0000 mL | OROMUCOSAL | Status: DC
  Administered 2016-07-14 – 2016-07-21 (×70): 15 mL via OROMUCOSAL

## 2016-07-14 MED ORDER — LORAZEPAM 0.5 MG PO TABS: 0.5000 mg | ORAL_TABLET | Freq: Every evening | ORAL | Status: DC | PRN

## 2016-07-14 MED ORDER — FENTANYL CITRATE (PF) 100 MCG/2ML IJ SOLN
INTRAMUSCULAR | Status: AC
  Administered 2016-07-14: 100 ug via INTRAVENOUS
  Filled 2016-07-14: qty 2

## 2016-07-14 MED ORDER — POTASSIUM CHLORIDE 20 MEQ/15ML (10%) PO SOLN
40.0000 meq | Freq: Two times a day (BID) | ORAL | Status: DC
  Administered 2016-07-14 – 2016-07-15 (×4): 40 meq via ORAL
  Filled 2016-07-14 (×4): qty 30

## 2016-07-14 MED ORDER — VITAL AF 1.2 CAL PO LIQD
1000.0000 mL | ORAL | Status: DC
  Administered 2016-07-14 – 2016-07-19 (×4): 1000 mL

## 2016-07-14 MED ORDER — CHLORHEXIDINE GLUCONATE 0.12 % MT SOLN
15.0000 mL | Freq: Two times a day (BID) | OROMUCOSAL | Status: DC
  Administered 2016-07-14 – 2016-07-21 (×16): 15 mL via OROMUCOSAL
  Filled 2016-07-14 (×4): qty 15

## 2016-07-14 MED ORDER — FUROSEMIDE 10 MG/ML IJ SOLN
10.0000 mg/h | INTRAVENOUS | Status: AC
  Administered 2016-07-14: 10 mg/h via INTRAVENOUS
  Filled 2016-07-14: qty 25

## 2016-07-14 MED ORDER — MIDAZOLAM HCL 2 MG/2ML IJ SOLN
1.0000 mg | INTRAMUSCULAR | Status: DC | PRN
  Administered 2016-07-14: 2 mg via INTRAVENOUS
  Administered 2016-07-18: 1 mg via INTRAVENOUS
  Administered 2016-07-19 – 2016-07-20 (×4): 2 mg via INTRAVENOUS
  Filled 2016-07-14 (×6): qty 2

## 2016-07-14 MED ORDER — IPRATROPIUM BROMIDE 0.02 % IN SOLN
0.5000 mg | Freq: Three times a day (TID) | RESPIRATORY_TRACT | Status: DC
  Administered 2016-07-14 – 2016-07-21 (×22): 0.5 mg via RESPIRATORY_TRACT
  Filled 2016-07-14 (×21): qty 2.5

## 2016-07-14 MED ORDER — FUROSEMIDE 10 MG/ML IJ SOLN
80.0000 mg | Freq: Two times a day (BID) | INTRAMUSCULAR | Status: DC
  Administered 2016-07-14: 80 mg via INTRAVENOUS
  Filled 2016-07-14: qty 8

## 2016-07-14 MED ORDER — MIDAZOLAM HCL 2 MG/2ML IJ SOLN
2.0000 mg | Freq: Once | INTRAMUSCULAR | Status: AC
Start: 2016-07-14 — End: 2016-07-14
  Administered 2016-07-14: 2 mg via INTRAVENOUS

## 2016-07-14 NOTE — Progress Notes (Signed)
   Last 1h resp distress care  - initially mild paradoxical movement but this has settled =- abg ok - as below  - CXR -pulm edema with effusion (done again 1:27 AM 9/7/2017_) - echo 1 month ago - diast dysfn with mitral regurg  Plan Continue bipap Monitor Lasix 80mg  IV  X 1  Dr. Kalman ShanMurali Ulysees Robarts, M.D., [facility] Pulmonary and Critical Care Medicine Staff Physician [facility]monary and Critical Care Pager: [phone], If no answer or between  15:00h - 7:00h: call [phone]  07/14/2016 1:30 AM

## 2016-07-14 NOTE — Progress Notes (Signed)
Initial Nutrition Assessment  DOCUMENTATION CODES:   Severe malnutrition in context of acute illness/injury  INTERVENTION:   Vital AF 1.2 @ 35 ml/hr 30 ml Prostat daily Provides: 1108 kcal (106% of needs), 78 grams protein, and 681 ml H2O.   NUTRITION DIAGNOSIS:   Malnutrition (Severe) related to acute illness (CHF, COPD) as evidenced by severe depletion of muscle mass, moderate depletion of body fat, 7 percent weight loss x 1 month.  GOAL:   Patient will meet greater than or equal to 90% of their needs  MONITOR:   TF tolerance, I & O's, Labs  REASON FOR ASSESSMENT:   Consult Enteral/tube feeding initiation and management  ASSESSMENT:   82yo female with multiple medical problems including chronic respiratory failure on the basis of dCHF and COPD on home O2, CKD III,  pulmonary nodule, PAF, recent cath without significant CAD c/b retroperitoneal bleed post procedure.  Presented 9/3 with worsening SOB.  She was admitted by [facility] to SDU on bipap with acute on chronic respiratory failure r/t acute on chronic CHF +/- COPD.  On 9/4 she remained extremely SOB, failed brief trial off bipap   Son at bedside and reports mom has had several recent admissions and has lost weight and does not think she has been eating much. She lives with her husband and gets around with a walker.  Noted weight trend down from last July. Pt admitted at 114 lb.   Patient is currently intubated on ventilator support MV: 6.1 L/min Temp (24hrs), Avg:98 F (36.7 C), Min:97.7 F (36.5 C), Max:98.4 F (36.9 C)  Medications reviewed and include: KCl, lasix Labs reviewed Nutrition-Focused physical exam completed. Findings are mild/moderate fat depletion, severe muscle depletion, and no edema. Skin on legs very dry.   OG tube   Diet Order:  Diet Heart Room service appropriate? Yes; Fluid consistency: Thin  Skin:  Reviewed, no issues  Last BM:  9/7  Height:   Ht Readings from Last 1 Encounters:   07/14/16 5' (1.524 m)    Weight:   Wt Readings from Last 1 Encounters:  07/14/16 119 lb 0.8 oz (54 kg)    Ideal Body Weight:  45.4 kg  BMI:  Body mass index is 23.25 kg/m.  Estimated Nutritional Needs:   Kcal:  1044  Protein:  70-85 grams  Fluid:  > 1.5 L/day  EDUCATION NEEDS:   No education needs identified at this time  Kendell BaneHeather Jakwan Sally RD, LDN, CNSC [phone] Pager [phone] After Hours Pager

## 2016-07-14 NOTE — Progress Notes (Signed)
PROGRESS NOTE  Sydney Jacobs ZHY:[id number] DOB: November 22, 1933 DOA: 07/10/2016 PCP: Sydney Jacobs  HPI/Recap of past 24 hours:  Patient was transferred to 2heart icu yesterday, She was briefly off bipap this am during breakfast, then ask for bipap to be placed back on due to sob, She remains on amiodarone drip, remain in afib last night, converted to sinus rhythm this am, urine output 1.1liter last 24hrs, cr remain elevated but slightly improved Patient requests anxiety meds [name] at bedside   Assessment/Plan: Principal Problem:   Acute on chronic diastolic congestive heart failure (HCC) Active Problems:   Elevated troponin   Chronic kidney disease (CKD), stage IV (severe) (HCC)   Essential hypertension   COPD (chronic obstructive pulmonary disease) (HCC)   Retroperitoneal hematoma in 8/17   PAF (paroxysmal atrial fibrillation) (HCC)   AAA (abdominal aortic aneurysm) (HCC)   Chronic respiratory failure (HCC)   Normocytic anemia   Hypokalemia   Atrial fibrillation with RVR (HCC)  Acute on chronic hypoxic respiratory failure ( was started on home o2 a few weeks ago), Acute dyspnea: was on bipap 9/4 and 9/5, back on bipap on 9/6, multifactorial including diastolic chf exacerbation in the setting of uncontrolled afib. Continue iv lopressor and lasix, cardiology and critical care following   Afib/rvr: started on amiodarone drip, not a candidate for anticoagulation due to r/o retroperitoneal hemorrhage.  Bilateral pleural effusion: continue lasix  Acute on chronic anemia: no obvious sign of bleed, transfuse 1units prbc on 9/6, hgb improved, STAT CT ab/pel negative for recurrent retroperitoneal bleed. Check FOBT  Hypokalemia: replaced k, mag 2.1  AkI on ckdIII:  Cr remain elevated, but slightly improved, continue lasix, cardiology to manage lasix drip  HTN:  d/c iv hydralazine, d/c norvasc, continue lopressor with holding parameters, keep  pushing lasix  Copd: currently no wheezing, continue nebs   Code Status: full code  Family Communication: patient, her husband and daughter in law at bedside  Disposition Plan: critically ill   Consultants:  [facility] admit on 9/3, transferred to icu on 9/4, [facility], patient went back to icu on 9/6,   cardiology  Procedures:  bipap  Antibiotics:  none   Objective: BP (!) 153/70   Pulse 76   Temp 97.9 F (36.6 C) (Axillary)   Resp 14   Ht 4\' 10"  (1.473 m)   Wt 54 kg (119 lb 0.8 oz)   SpO2 97%   BMI 24.88 kg/m   Intake/Output Summary (Last 24 hours) at 07/14/16 0743 Last data filed at 07/14/16 0600  Gross per 24 hour  Intake            955.8 ml  Output             1130 ml  Net           -174.2 ml   Filed Weights   07/12/16 0600 07/13/16 0400 07/14/16 0500  Weight: 53.2 kg (117 lb 4.6 oz) 55.5 kg (122 lb 4.8 oz) 54 kg (119 lb 0.8 oz)    Exam:   General:  Dyspnea, no bipap  Cardiovascular: RRR  Respiratory: bilateral crackles, no significant wheezing, no rhonchi  Abdomen: Soft/ND/NT, positive BS  Musculoskeletal: No Edema  Neuro: alert on bipap  Data Reviewed: Basic Metabolic Panel:  Recent Labs Lab 07/11/16 0250 07/11/16 1648 07/12/16 0458 07/13/16 0416 07/13/16 1849 07/14/16 0534  NA 142  --  144 142 142 142  K 3.0* 3.5 3.8 3.0* 4.1 4.2  CL 102  --  103 95* 100* 100*  CO2 31  --  30 31 32 30  GLUCOSE 128*  --  156* 169* 143* 126*  BUN 54*  --  60* 73* 69* 67*  CREATININE 2.21*  --  2.35* 2.47* 2.44* 2.36*  CALCIUM 7.8*  --  8.4* 8.1* 8.5* 8.5*  MG  --  1.3* 2.1 2.1  --  2.0  PHOS  --   --   --  4.1  --  3.8   Liver Function Tests:  Recent Labs Lab 07/10/16 1957  AST 12*  ALT 11*  ALKPHOS 51  BILITOT 0.8  PROT 5.6*  ALBUMIN 3.2*   No results for input(s): LIPASE, AMYLASE in the last 168 hours. No results for input(s): AMMONIA in the last 168 hours. CBC:  Recent Labs Lab  07/10/16 1957 07/12/16 0458 07/13/16 0416 07/13/16 1849 07/14/16 0534  WBC 7.4 5.5 10.3 13.3* 9.9  NEUTROABS 5.8  --   --   --   --   HGB 9.3* 8.4* 7.7* 10.0* 9.8*  HCT 28.9* 26.8* 24.3* 31.2* 30.1*  MCV 88.4 89.0 88.4 86.9 86.7  PLT 205 178 183 221 184   Cardiac Enzymes:    Recent Labs Lab 07/10/16 1957 07/11/16 0250 07/11/16 0813 07/11/16 1447  TROPONINI 0.03* 0.04* 0.04* 0.04*   BNP (last 3 results)  Recent Labs  05/23/16 1330 06/12/16 1237 07/10/16 1957  BNP 1,216.0* 2,185.0* 1,649.0*    ProBNP (last 3 results) No results for input(s): PROBNP in the last 8760 hours.  CBG:  Recent Labs Lab 07/11/16 1857  GLUCAP 110*    Recent Results (from the past 240 hour(s))  MRSA PCR Screening     Status: None   Collection Time: 07/11/16  1:16 AM  Result Value Ref Range Status   MRSA by PCR NEGATIVE NEGATIVE Final    Comment:        The GeneXpert MRSA Assay (FDA approved for NASAL specimens only), is one component of a comprehensive MRSA colonization surveillance program. It is not intended to diagnose MRSA infection nor to guide or monitor treatment for MRSA infections.      Studies: Ct Abdomen Pelvis Wo Contrast  Result Date: 07/13/2016 CLINICAL DATA:  Anemia. Rule out retroperitoneal hemorrhage. Aneurysm. EXAM: CT ABDOMEN AND PELVIS WITHOUT CONTRAST TECHNIQUE: Multidetector CT imaging of the abdomen and pelvis was performed following the standard protocol without IV contrast. COMPARISON:  CT abdomen pelvis 05/28/2016 FINDINGS: Lower chest: Moderately large bilateral pleural effusions have increased in size since the prior CT. There is compressive atelectasis in both lung bases. Left ventricular enlargement. Calcification of the mitral annulus again noted. Hepatobiliary: Liver gallbladder and bile ducts within normal limits. Pancreas: Negative Spleen: Negative Adrenals/Urinary Tract: Large right upper pole renal cyst measuring 9 x 11 cm. This displaces the  kidney inferiorly. No renal hydronephrosis. Atrophic left kidney likely due to renovascular disease. Left renal cyst 4 cm. Sub cm hyperdense nodule left upper pole unchanged. Urinary bladder decompressed with Foley catheter Stomach/Bowel: Stomach and duodenum normal. Negative for bowel obstruction. No bowel mass or edema identified. Sigmoid diverticulosis. Negative for diverticulitis. Vascular/Lymphatic: Atherosclerotic calcifications are present diffusely. Infrarenal abdominal aortic aneurysm measures 3.9 x 4.2 cm. Intimal calcifications remain displaced into the lumen unchanged. No retroperitoneal hemorrhage around the aneurysm. No evidence of aneurysm leak. Atherosclerotic calcification extends throughout the iliac arteries bilaterally. Reproductive: Hysterectomy.  No pelvic mass. Other: Right inguinal hematoma has  improved now measuring 2.4 cm. Extraperitoneal hemorrhage in the right iliac fossa has nearly completely resolved. No new area of hemorrhage. Musculoskeletal: Lumbar disc and facet degeneration. No acute skeletal abnormality right hip replacement. Mild levoscoliosis. IMPRESSION: Near complete resolution of right inguinal hematoma and extraperitoneal hematoma on the right. No new area of hemorrhage. Atherosclerotic infrarenal abdominal aortic aneurysm measures 3.9 x 4.2 cm without evidence of leak. Moderately large bilateral pleural effusions with bibasilar atelectasis. Atrophic left kidney. Bilateral renal cyst. Large right renal cyst. Electronically Signed   By: Marlan Palau M.D.   On: 07/13/2016 11:21   Dg Chest Port 1 View  Result Date: 07/14/2016 CLINICAL DATA:  82 year old female with acute respiratory failure. EXAM: PORTABLE CHEST 1 VIEW COMPARISON:  Chest radiograph dated 07/13/2016 FINDINGS: There has been interval development of a small-to-moderate right pleural effusion, new from prior study. Stable appearing left-sided pleural effusion. There is persistent vascular congestion and  interstitial edema. Superimposed pneumonia is not excluded. There is no pneumothorax. Stable mild cardiomegaly. No acute osseous pathology. IMPRESSION: Congestive heart failure with pulmonary edema and interval development of a small to moderate right pleural effusion. Clinical correlation and follow-up recommended. Electronically Signed   By: Elgie Collard M.D.   On: 07/14/2016 01:19    Scheduled Meds: . atorvastatin  20 mg Oral q1800  . chlorhexidine  15 mL Mouth Rinse BID  . famotidine  20 mg Oral QHS  . furosemide  80 mg Intravenous Q12H  . ipratropium  0.5 mg Nebulization Q8H  . levalbuterol  1.25 mg Nebulization Q8H  . mouth rinse  15 mL Mouth Rinse BID  . mouth rinse  15 mL Mouth Rinse QID  . metoprolol  5 mg Intravenous Q6H  . potassium chloride  40 mEq Oral Once  . potassium chloride  40 mEq Oral BID  . sodium chloride flush  3 mL Intravenous Q12H    Continuous Infusions: . amiodarone 30 mg/hr (07/14/16 0700)  . nitroGLYCERIN Stopped (07/12/16 0507)     Time spent:  Ambree Frances MD, PhD  [facility] Pager [phone]. If 7PM-7AM, please contact night-coverage at [email or web address], password [facility] 07/14/2016, 7:43 AM  LOS: 4 days

## 2016-07-14 NOTE — Progress Notes (Signed)
PULMONARY / CRITICAL CARE MEDICINE   Name: Sydney Jacobs MRN: [id number] DOB: Apr 06, 1934    ADMISSION DATE:  07/10/2016 CONSULTATION DATE:  9/4  REFERRING MD:  Regalado ([facility])   CHIEF COMPLAINT:  Respiratory failure   HISTORY OF PRESENT ILLNESS:   82yo female with multiple medical problems including chronic respiratory failure on the basis of dCHF and COPD on home O2, CKD III,  pulmonary nodule, PAF, recent cath without significant CAD c/b retroperitoneal bleed post procedure.  Presented 9/3 with worsening SOB.  She was admitted by [facility] to SDU on bipap with acute on chronic respiratory failure r/t acute on chronic CHF +/- COPD.  On 9/4 she remained extremely SOB, failed brief trial off bipap and PCCM consulted.   Currently comfortable on bipap.  States dyspnea is "much better".  Denies purulent sputum, chest pain, hemoptysis.  9/6 PCCM called back for Dyspnea, Afib RVR, anemia.  SUBJECTIVE:  Back on BiPAP, currently on SDU, most likely needs to go back to ICU.  VITAL SIGNS: BP (!) 156/78   Pulse 85   Temp 98.4 F (36.9 C) (Axillary)   Resp 20   Ht 4\' 10"  (1.473 m)   Wt 54 kg (119 lb 0.8 oz)   SpO2 96%   BMI 24.88 kg/m   HEMODYNAMICS:    VENTILATOR SETTINGS: FiO2 (%):  [45 %] 45 %  INTAKE / OUTPUT: I/O last 3 completed shifts: In: 1265.8 [P.O.:240; I.V.:645.8; Blood:380] Out: 1630 [Urine:1630]  PHYSICAL EXAMINATION: General:  Chronically ill appearing female, severe respiratory distress on bipap  Neuro:  Alert and interactive, very anxious HEENT:  [place]/AT, PERRL, EOM-I and MMM Cardiovascular:  Nl s1s2 irreg Afib with HR 120-160 on amio drip Lungs:  resps even, non labored on bipap, diminished L base, crackles throughout L>R  Abdomen:  Round, soft, non tender  Musculoskeletal:  Warm and dry, BLE edema  LABS:  BMET  Recent Labs Lab 07/13/16 0416 07/13/16 1849 07/14/16 0534  NA 142 142 142  K 3.0* 4.1 4.2  CL 95* 100* 100*  CO2 31 32 30  BUN 73* 69* 67*   CREATININE 2.47* 2.44* 2.36*  GLUCOSE 169* 143* 126*   Electrolytes  Recent Labs Lab 07/12/16 0458 07/13/16 0416 07/13/16 1849 07/14/16 0534  CALCIUM 8.4* 8.1* 8.5* 8.5*  MG 2.1 2.1  --  2.0  PHOS  --  4.1  --  3.8   CBC  Recent Labs Lab 07/13/16 0416 07/13/16 1849 07/14/16 0534  WBC 10.3 13.3* 9.9  HGB 7.7* 10.0* 9.8*  HCT 24.3* 31.2* 30.1*  PLT 183 221 184   Coag's  Recent Labs Lab 07/10/16 1957  INR 1.04   Sepsis Markers  Recent Labs Lab 07/10/16 2012 07/10/16 2254 07/10/16 2300 07/12/16 0458 07/14/16 0534  LATICACIDVEN 0.84  --  0.51  --   --   PROCALCITON  --  0.13  --  0.21 0.19   ABG  Recent Labs Lab 07/10/16 2015 07/13/16 0335 07/13/16 2350  PHART 7.450 7.465* 7.490*  PCO2ART 46.1 46.7 42.4  PO2ART 144* 74.0* 71.4*   Liver Enzymes  Recent Labs Lab 07/10/16 1957  AST 12*  ALT 11*  ALKPHOS 51  BILITOT 0.8  ALBUMIN 3.2*   Cardiac Enzymes  Recent Labs Lab 07/11/16 0250 07/11/16 0813 07/11/16 1447  TROPONINI 0.04* 0.04* 0.04*   Glucose  Recent Labs Lab 07/11/16 1857  GLUCAP 110*   Imaging Dg Chest Port 1 View  Result Date: 07/14/2016 CLINICAL DATA:  82 year old female with  acute respiratory failure. EXAM: PORTABLE CHEST 1 VIEW COMPARISON:  Chest radiograph dated 07/13/2016 FINDINGS: There has been interval development of a small-to-moderate right pleural effusion, new from prior study. Stable appearing left-sided pleural effusion. There is persistent vascular congestion and interstitial edema. Superimposed pneumonia is not excluded. There is no pneumothorax. Stable mild cardiomegaly. No acute osseous pathology. IMPRESSION: Congestive heart failure with pulmonary edema and interval development of a small to moderate right pleural effusion. Clinical correlation and follow-up recommended. Electronically Signed   By: Elgie CollardArash  Radparvar M.D.   On: 07/14/2016 01:19   STUDIES:  2D echo 8/7>>> EF 60-65%, grade 2 diastolic  dysfunction, mod MR  CULTURES: None  ANTIBIOTICS: None  SIGNIFICANT EVENTS: Acute respiratory failure requiring intubation 9/7  LINES/TUBES: ETT 9/6>>> PIV  DISCUSSION: 82yo female with acute on chronic respiratory failure r/t decompensated heart failure with underlying COPD.   ASSESSMENT / PLAN:  PULMONARY Acute on chronic respiratory failure - r/t decompensated diastolic heart failure with underlying COPD  Bilateral Pleural effusion L>R COPD without acute exacerbation  Known pulmonary nodule  9/6 back on NIMVS with AFIBRVR, ANEMIA, DIURESIS, O2 DEPENDENT COPD as contributing factors. P:   Intubate Mechanically ventilate F/U ABG and CXR Duonebs  Lasix drip ?CVL for cvp   CARDIOVASCULAR Acute on chronic diastolic heart failure.  More frequent exacerbations recently.  BNP elevated.  Recently changed to torsemide as outpt.  AFib with RVR 9/6 HTN  P:  Cardiology following  Lopressor 5 mg IV q6 with holding parameters. BP control   RENAL Lab Results  Component Value Date   CREATININE 2.36 (H) 07/14/2016   CREATININE 2.44 (H) 07/13/2016   CREATININE 2.47 (H) 07/13/2016   CREATININE 1.77 (H) 07/06/2016   CKD IV  Hypokalemia  P:   Replete electrolytes as needed. F/u chem  KVO IVF Lasix drip at 10 mg/hr. Zaroxolyn 10. If no response may need CVVH, will hold off on renal consultation for now.  GASTROINTESTINAL No active issue  P:   Consult nutrition for TF as per nutrition. Protonix.  HEMATOLOGIC  Recent Labs  07/13/16 1849 07/14/16 0534  HGB 10.0* 9.8*   Hx of RPB in past, now with worsening anemia(may be contributing factor to dyspnea) P:  No Anticoagulation for retroperitoneal bleed F/u CBC  Tx per cards in setting CAD CT abd r/o recurrent RPB  INFECTIOUS No active issue  P:   Monitor WBC, fever curve off abx   ENDOCRINE CBG (last 3)   Recent Labs  07/11/16 1857  GLUCAP 110*   Hyperglycemia - no known hx DM   P:   Monitor  glucose on chem   NEUROLOGIC No active issue , alert on NIMVS P:   Intubate and sedate. Versed PRN. Fentanyl drip. Supportive care.  FAMILY  - Updates: Patient and family updated bedside, intubate, dialysis ok, no trach/peg.  The patient is critically ill with multiple organ systems failure and requires high complexity decision making for assessment and support, frequent evaluation and titration of therapies, application of advanced monitoring technologies and extensive interpretation of multiple databases.   Critical Care Time devoted to patient care services described in this note is  35  Minutes. This time reflects time of care of this signee Dr Koren BoundWesam Providence Stivers. This critical care time does not reflect procedure time, or teaching time or supervisory time of PA/NP/Med student/Med Resident etc but could involve care discussion time.  Alyson ReedyWesam G. Saniyya Gau, M.D. [facility]. Pager: [phone]. After hours pager: [phone].

## 2016-07-14 NOTE — Progress Notes (Signed)
eLink Physician-Brief Progress Note Patient Name: Sydney Jacobs DOB: 28-Feb-1934 MRN: [id number]   Date of Service  07/14/2016  HPI/Events of Note  Blood glucose = 195.   eICU Interventions  Will order: 1. Q 4 hour sensitive Novolog SSI.     Intervention Category Major Interventions: Hyperglycemia - active titration of insulin therapy  [name] Eugene 07/14/2016, 9:25 PM

## 2016-07-14 NOTE — Procedures (Signed)
Intubation Procedure Note Sydney Jacobs [id number] 1934-03-27  Procedure: Intubation Indications: Respiratory insufficiency  Procedure Details Consent: Risks of procedure as well as the alternatives and risks of each were explained to the (patient/caregiver).  Consent for procedure obtained. Time Out: Verified patient identification, verified procedure, site/side was marked, verified correct patient position, special equipment/implants available, medications/allergies/relevent history reviewed, required imaging and test results available.  Performed  Maximum sterile technique was used including gloves, hand hygiene and mask.  MAC    Evaluation Hemodynamic Status: BP stable throughout; O2 sats: stable throughout Patient's Current Condition: stable Complications: No apparent complications Patient did tolerate procedure well. Chest X-ray ordered to verify placement.  CXR: pending.   Sydney Jacobs,Larae Caison 07/14/2016

## 2016-07-14 NOTE — Progress Notes (Signed)
Pt was removed from Bipap for breakfast to 6L [place]. Pt tolerated well. Pt is now asking for Bipap to be placed back on at this time due to SOB and Inc WOB. Pt back on Bipap now, resting comfortably 12/6 45%

## 2016-07-15 ENCOUNTER — Other Ambulatory Visit: Payer: Commercial Managed Care - HMO

## 2016-07-15 ENCOUNTER — Encounter (HOSPITAL_COMMUNITY): Payer: Commercial Managed Care - HMO

## 2016-07-15 ENCOUNTER — Inpatient Hospital Stay (HOSPITAL_COMMUNITY): Payer: Commercial Managed Care - HMO

## 2016-07-15 ENCOUNTER — Ambulatory Visit: Payer: Commercial Managed Care - HMO | Admitting: Physician Assistant

## 2016-07-15 LAB — BASIC METABOLIC PANEL
ANION GAP: 16 — AB (ref 5–15)
Anion gap: 13 (ref 5–15)
BUN: 69 mg/dL — ABNORMAL HIGH (ref 6–20)
BUN: 75 mg/dL — ABNORMAL HIGH (ref 6–20)
CALCIUM: 9 mg/dL (ref 8.9–10.3)
CHLORIDE: 98 mmol/L — AB (ref 101–111)
CO2: 33 mmol/L — AB (ref 22–32)
CO2: 32 mmol/L (ref 22–32)
Calcium: 8.6 mg/dL — ABNORMAL LOW (ref 8.9–10.3)
Chloride: 95 mmol/L — ABNORMAL LOW (ref 101–111)
Creatinine, Ser: 2.16 mg/dL — ABNORMAL HIGH (ref 0.44–1.00)
Creatinine, Ser: 2.41 mg/dL — ABNORMAL HIGH (ref 0.44–1.00)
GFR calc non Af Amer: 21 mL/min — ABNORMAL LOW (ref >60)
GFR, EST AFRICAN AMERICAN: 24 mL/min — AB (ref >60)
GFR, EST AFRICAN AMERICAN: 21 mL/min — AB (ref >60)
GFR, EST NON AFRICAN AMERICAN: 18 mL/min — AB (ref >60)
GLUCOSE: 157 mg/dL — AB (ref 65–99)
Glucose, Bld: 122 mg/dL — ABNORMAL HIGH (ref 65–99)
POTASSIUM: 4.3 mmol/L (ref 3.5–5.1)
Potassium: 4.6 mmol/L (ref 3.5–5.1)
SODIUM: 144 mmol/L (ref 135–145)
SODIUM: 143 mmol/L (ref 135–145)

## 2016-07-15 LAB — BLOOD GAS, ARTERIAL
ACID-BASE EXCESS: 11 mmol/L — AB (ref 0.0–2.0)
BICARBONATE: 35.9 mmol/L — AB (ref 20.0–28.0)
Drawn by: 44956
FIO2: 50
LHR: 16 {breaths}/min
O2 SAT: 94.7 %
PATIENT TEMPERATURE: 98.6
PCO2 ART: 55.9 mmHg — AB (ref 32.0–48.0)
PEEP/CPAP: 5 cmH2O
VT: 380 mL
pH, Arterial: 7.424 (ref 7.350–7.450)
pO2, Arterial: 73.3 mmHg — ABNORMAL LOW (ref 83.0–108.0)

## 2016-07-15 LAB — CBC
HCT: 31.5 % — ABNORMAL LOW (ref 36.0–46.0)
HEMATOCRIT: 32.3 % — AB (ref 36.0–46.0)
HEMOGLOBIN: 10 g/dL — AB (ref 12.0–15.0)
HEMOGLOBIN: 9.9 g/dL — AB (ref 12.0–15.0)
MCH: 27.8 pg (ref 26.0–34.0)
MCH: 28 pg (ref 26.0–34.0)
MCHC: 31 g/dL (ref 30.0–36.0)
MCHC: 31.4 g/dL (ref 30.0–36.0)
MCV: 89.7 fL (ref 78.0–100.0)
MCV: 89.2 fL (ref 78.0–100.0)
PLATELETS: 175 10*3/uL (ref 150–400)
Platelets: 151 10*3/uL (ref 150–400)
RBC: 3.6 MIL/uL — AB (ref 3.87–5.11)
RBC: 3.53 MIL/uL — ABNORMAL LOW (ref 3.87–5.11)
RDW: 16 % — ABNORMAL HIGH (ref 11.5–15.5)
RDW: 16.1 % — AB (ref 11.5–15.5)
WBC: 9.2 10*3/uL (ref 4.0–10.5)
WBC: 8.7 10*3/uL (ref 4.0–10.5)

## 2016-07-15 LAB — GLUCOSE, CAPILLARY
GLUCOSE-CAPILLARY: 156 mg/dL — AB (ref 65–99)
GLUCOSE-CAPILLARY: 170 mg/dL — AB (ref 65–99)
GLUCOSE-CAPILLARY: 179 mg/dL — AB (ref 65–99)
GLUCOSE-CAPILLARY: 168 mg/dL — AB (ref 65–99)
Glucose-Capillary: 126 mg/dL — ABNORMAL HIGH (ref 65–99)
Glucose-Capillary: 152 mg/dL — ABNORMAL HIGH (ref 65–99)

## 2016-07-15 LAB — MAGNESIUM
MAGNESIUM: 1.9 mg/dL (ref 1.7–2.4)
Magnesium: 1.9 mg/dL (ref 1.7–2.4)
Magnesium: 1.8 mg/dL (ref 1.7–2.4)

## 2016-07-15 LAB — PHOSPHORUS
Phosphorus: 3.9 mg/dL (ref 2.5–4.6)
Phosphorus: 3.2 mg/dL (ref 2.5–4.6)

## 2016-07-15 MED ORDER — AMIODARONE IV BOLUS ONLY 150 MG/100ML
150.0000 mg | Freq: Once | INTRAVENOUS | Status: AC
  Administered 2016-07-15: 150 mg via INTRAVENOUS

## 2016-07-15 MED ORDER — MAGNESIUM SULFATE IN D5W 1-5 GM/100ML-% IV SOLN
1.0000 g | Freq: Once | INTRAVENOUS | Status: AC
  Administered 2016-07-15: 1 g via INTRAVENOUS
  Filled 2016-07-15: qty 100

## 2016-07-15 MED ORDER — FUROSEMIDE 10 MG/ML IJ SOLN
15.0000 mg/h | INTRAMUSCULAR | Status: DC
  Administered 2016-07-15: 10 mg/h via INTRAVENOUS
  Administered 2016-07-16 – 2016-07-17 (×2): 15 mg/h via INTRAVENOUS
  Filled 2016-07-15 (×6): qty 25

## 2016-07-15 MED ORDER — METOLAZONE 5 MG PO TABS
10.0000 mg | ORAL_TABLET | Freq: Once | ORAL | Status: AC
  Administered 2016-07-15: 10 mg via ORAL
  Filled 2016-07-15: qty 2

## 2016-07-15 MED ORDER — FUROSEMIDE 10 MG/ML IJ SOLN: 10.0000 mg/h | INTRAVENOUS | Status: DC

## 2016-07-15 MED ORDER — METOPROLOL TARTRATE 25 MG PO TABS
25.0000 mg | ORAL_TABLET | Freq: Two times a day (BID) | ORAL | Status: DC
  Administered 2016-07-15 (×2): 25 mg
  Filled 2016-07-15 (×2): qty 1

## 2016-07-15 MED ORDER — ALBUMIN HUMAN 25 % IV SOLN
25.0000 g | Freq: Four times a day (QID) | INTRAVENOUS | Status: AC
  Administered 2016-07-15 – 2016-07-16 (×4): 25 g via INTRAVENOUS
  Filled 2016-07-15: qty 100
  Filled 2016-07-15 (×5): qty 50
  Filled 2016-07-15: qty 100
  Filled 2016-07-15: qty 50

## 2016-07-15 NOTE — Progress Notes (Signed)
PULMONARY / CRITICAL CARE MEDICINE   Name: Sydney Jacobs MRN: [id number] DOB: 1933-12-24    ADMISSION DATE:  07/10/2016 CONSULTATION DATE:  9/4  REFERRING MD:  Regalado ([facility])   CHIEF COMPLAINT:  Respiratory failure   HISTORY OF PRESENT ILLNESS:   82yo female with multiple medical problems including chronic respiratory failure on the basis of dCHF and COPD on home O2, CKD III,  pulmonary nodule, PAF, recent cath without significant CAD c/b retroperitoneal bleed post procedure.  Presented 9/3 with worsening SOB.  She was admitted by [facility] to SDU on bipap with acute on chronic respiratory failure r/t acute on chronic CHF +/- COPD.  On 9/4 she remained extremely SOB, failed brief trial off bipap and PCCM consulted.   Currently comfortable on bipap.  States dyspnea is "much better".  Denies purulent sputum, chest pain, hemoptysis.  9/6 PCCM called back for Dyspnea, Afib RVR, anemia.  SUBJECTIVE:  Back on BiPAP, currently on SDU, most likely needs to go back to ICU.  VITAL SIGNS: BP (!) 143/76   Pulse 83   Temp 98.1 F (36.7 C) (Oral)   Resp 10   Ht 5' (1.524 m)   Wt 53.3 kg (117 lb 8.1 oz)   SpO2 96%   BMI 22.95 kg/m   HEMODYNAMICS:    VENTILATOR SETTINGS: Vent Mode: PSV;CPAP FiO2 (%):  [50 %-100 %] 50 % Set Rate:  [16 bmp] 16 bmp Vt Set:  [380 mL-420 mL] 380 mL PEEP:  [5 cmH20] 5 cmH20 Pressure Support:  [12 cmH20] 12 cmH20 Plateau Pressure:  [13 cmH20-19 cmH20] 19 cmH20  INTAKE / OUTPUT: I/O last 3 completed shifts: In: 1869.2 [P.O.:50; I.V.:1112; NG/GT:707.2] Out: 3355 [Urine:3155; Emesis/NG output:200]  PHYSICAL EXAMINATION: General:  Chronically ill appearing female, severe respiratory distress on bipap  Neuro:  Alert and interactive, very anxious HEENT:  Orient/AT, PERRL, EOM-I and MMM Cardiovascular:  Nl s1s2 irreg Afib with HR 120-160 on amio drip Lungs:  resps even, non labored on bipap, diminished L base, crackles throughout L>R  Abdomen:  Round, soft, non  tender  Musculoskeletal:  Warm and dry, BLE edema  LABS:  BMET  Recent Labs Lab 07/13/16 1849 07/14/16 0534 07/15/16 0448  NA 142 142 144  K 4.1 4.2 4.6  CL 100* 100* 98*  CO2 32 30 33*  BUN 69* 67* 69*  CREATININE 2.44* 2.36* 2.16*  GLUCOSE 143* 126* 122*   Electrolytes  Recent Labs Lab 07/13/16 1849 07/14/16 0534 07/14/16 1635 07/15/16 0448  CALCIUM 8.5* 8.5*  --  8.6*  MG  --  2.0 2.0 1.9  PHOS  --  3.8 3.9 3.9   CBC  Recent Labs Lab 07/14/16 0534 07/15/16 0448 07/15/16 0735  WBC 9.9 9.2 8.7  HGB 9.8* 10.0* 9.9*  HCT 30.1* 32.3* 31.5*  PLT 184 175 151   Coag's  Recent Labs Lab 07/10/16 1957  INR 1.04   Sepsis Markers  Recent Labs Lab 07/10/16 2012 07/10/16 2254 07/10/16 2300 07/12/16 0458 07/14/16 0534  LATICACIDVEN 0.84  --  0.51  --   --   PROCALCITON  --  0.13  --  0.21 0.19   ABG  Recent Labs Lab 07/13/16 2350 07/14/16 1302 07/15/16 0500  PHART 7.490* 7.463* 7.424  PCO2ART 42.4 54.6* 55.9*  PO2ART 71.4* 272.0* 73.3*   Liver Enzymes  Recent Labs Lab 07/10/16 1957  AST 12*  ALT 11*  ALKPHOS 51  BILITOT 0.8  ALBUMIN 3.2*   Cardiac Enzymes  Recent Labs Lab  07/11/16 0250 07/11/16 0813 07/11/16 1447  TROPONINI 0.04* 0.04* 0.04*   Glucose  Recent Labs Lab 07/11/16 1857 07/14/16 2122 07/15/16 0028 07/15/16 0400 07/15/16 0746  GLUCAP 110* 195* 152* 126* 156*   Imaging Dg Chest Port 1 View  Result Date: 07/15/2016 CLINICAL DATA:  Intubation, essential hypertension, COPD, CHF, stage III chronic kidney disease, abdominal aortic aneurysm, atrial fibrillation EXAM: PORTABLE CHEST 1 VIEW COMPARISON:  Portable exam 0537 hours compared to 07/14/2016 FINDINGS: Tip of endotracheal tube within 6 mm of carina, recommend withdrawal 1-2 cm. Enlargement of cardiac silhouette. Atherosclerotic calcification aorta. Diffuse BILATERAL pulmonary infiltrates again identified question edema versus pneumonia. Bibasilar effusions. No  pneumothorax. Osseous demineralization. IMPRESSION: Tip of endotracheal tube within 6 mm of carina; recommend withdrawal 1-2 cm. Persistent diffuse BILATERAL airspace infiltrates question edema versus infection with associated bibasilar effusions. Findings called to patient's nurse Dois Davenport RN on [facility] on 07/15/2016 at 0843 hours. Electronically Signed   By: Ulyses Southward M.D.   On: 07/15/2016 08:45   Dg Chest Port 1 View  Result Date: 07/14/2016 CLINICAL DATA:  Intubation . EXAM: PORTABLE CHEST 1 VIEW FINDINGS: Endotracheal tube 1 cm above the lower portion of the carina, withdrawal of approximately 2 cm suggested. NG tube noted with tip below left hemidiaphragm. Heart size normal. Diffuse multifocal bilateral pulmonary infiltrates and/or edema are again noted without interim improvement. Bilateral pleural effusions noted. No pneumothorax. IMPRESSION: 1. Endotracheal tube 1 cm above the lower portion of the carina. Withdrawal of approximately 2 cm suggested. 2. NG tube noted with tip below left hemidiaphragm. 3. Persistent multifocal pulmonary infiltrates are and/or edema again noted. Bilateral pleural effusions again noted. These results will be called to the ordering clinician or representative by the Radiologist Assistant, and communication documented in the PACS or zVision Dashboard. Electronically Signed   By: Maisie Fus  Register   On: 07/14/2016 12:42   Dg Abd Portable 1v  Result Date: 07/14/2016 CLINICAL DATA:  Orogastric tube placement EXAM: PORTABLE ABDOMEN - 1 VIEW COMPARISON:  Portable exam 1221 hours compared to CT abdomen and pelvis 07/13/2013 FINDINGS: Tip of orogastric tube projects over proximal to mid stomach. Visualized bowel gas pattern normal. Aortic atherosclerotic calcification extending and iliac arteries. LEFT lower lobe atelectasis versus consolidation with associated bibasilar effusions. Bones demineralized. IMPRESSION: Tip of orogastric tube projects over proximal to mid stomach.  Bibasilar effusions with atelectasis versus consolidation in LEFT lower lobe. Aortic atherosclerosis. Electronically Signed   By: Ulyses Southward M.D.   On: 07/14/2016 12:45   STUDIES:  2D echo 8/7>>> EF 60-65%, grade 2 diastolic dysfunction, mod MR  CULTURES: None  ANTIBIOTICS: None  SIGNIFICANT EVENTS: No events overnight.  LINES/TUBES: ETT 9/6>>> PIV  DISCUSSION: 82yo female with acute on chronic respiratory failure r/t decompensated heart failure with underlying COPD.   ASSESSMENT / PLAN:  PULMONARY Acute on chronic respiratory failure - r/t decompensated diastolic heart failure with underlying COPD  Bilateral Pleural effusion L>R COPD without acute exacerbation  Known pulmonary nodule  9/6 back on NIMVS with AFIBRVR, ANEMIA, DIURESIS, O2 DEPENDENT COPD as contributing factors. P:   Full vent support. Mechanically ventilate. F/U ABG and CXR. Duonebs. Lasix drip.  CARDIOVASCULAR Acute on chronic diastolic heart failure.  More frequent exacerbations recently.  BNP elevated.  Recently changed to torsemide as outpt.  AFib with RVR 9/6 HTN  P:  Cardiology following. Lopressor 5 mg IV q6 with holding parameters. Add PO lopressor BID. BP control.  RENAL Lab Results  Component Value Date  CREATININE 2.16 (H) 07/15/2016   CREATININE 2.36 (H) 07/14/2016   CREATININE 2.44 (H) 07/13/2016   CREATININE 1.77 (H) 07/06/2016   CKD IV  Hypokalemia  P:   Replete electrolytes as needed. F/u chem. KVO IVF. Lasix drip at 10 mg/hr x24 hours. Zaroxolyn 10 mg PO x1. Albumin 25 gm IV q6 x4 doses.  GASTROINTESTINAL No active issue  P:   TF per nutrition. Protonix.  HEMATOLOGIC  Recent Labs  07/15/16 0448 07/15/16 0735  HGB 10.0* 9.9*   Hx of RPB in past, now with worsening anemia(may be contributing factor to dyspnea) P:  No Anticoagulation for retroperitoneal bleed. F/u CBC. Tx per cards in setting CAD. CT abd r/o recurrent RPB.  INFECTIOUS No active issue   P:   Monitor WBC, fever curve off abx   ENDOCRINE CBG (last 3)   Recent Labs  07/15/16 0028 07/15/16 0400 07/15/16 0746  GLUCAP 152* 126* 156*   Hyperglycemia - no known hx DM   P:   Monitor glucose on chem   NEUROLOGIC No active issue , alert on NIMVS P:   Versed PRN. Fentanyl drip. Supportive care.  FAMILY  - Updates: Patient and family updated bedside, dialysis ok if needs be, no trach/peg.  The patient is critically ill with multiple organ systems failure and requires high complexity decision making for assessment and support, frequent evaluation and titration of therapies, application of advanced monitoring technologies and extensive interpretation of multiple databases.   Critical Care Time devoted to patient care services described in this note is  35  Minutes. This time reflects time of care of this signee Dr Koren BoundWesam Akirra Lacerda. This critical care time does not reflect procedure time, or teaching time or supervisory time of PA/NP/Med student/Med Resident etc but could involve care discussion time.  Alyson ReedyWesam G. Jodi Criscuolo, M.D. [facility] Medicine. Pager: [phone]. After hours pager: [phone].

## 2016-07-15 NOTE — Progress Notes (Signed)
Patient still in A fib with heart rate ranging 110-130's.  Scheduled metoprolol given early.  Called and spoke with MD Jamison NeighborNestor, new orders received.

## 2016-07-15 NOTE — Consult Note (Signed)
   [facility]   07/15/2016  Sydney Jacobs 06-10-1934 [id number]  [facility].  Patient has had 4 hospitalizations within 6 months.  Chart review reveals the patient is a 82yo female with multiple medical problems including chronic respiratory failure on the basis of HF and COPD on home O2, CKD III,  pulmonary nodule, PAF, recent cath without significant CAD c/b retroperitoneal bleed post procedure.  Presented 9/3 with worsening SOB.  She was admitted patient is currently ventilated. Will follow as appropriate.  For questions, please contact:  Charlesetta ShanksVictoria Artemio Dobie, RN BSN CCM [facility] Liaison  [phone] business mobile phone Toll free office [phone]

## 2016-07-15 NOTE — Progress Notes (Signed)
Patient's heart rate increased up to 160's during movement in bed.  Heart rate ranging 120's to 160's with wide complexes.  Called eLink and spoke with MD Sommers, EKG showed Afib RVR.  New orders received.

## 2016-07-15 NOTE — Progress Notes (Signed)
ETT was retracted back 2cm per MD verbal order. Volumes returned to normal limits after the retraction and patient tolerated well. RT will continue to monitor.

## 2016-07-15 NOTE — Progress Notes (Signed)
eLink Physician-Brief Progress Note Patient Name: Sydney Jacobs DOB: August 12, 1934 MRN: [id number]   Date of Service  07/15/2016  HPI/Events of Note  Elevated HR - Ventricular rate 130-140's.  eICU Interventions  Will order EKG.     Intervention Category Major Interventions: Arrhythmia - evaluation and management  Taquisha Phung Eugene 07/15/2016, 8:07 PM

## 2016-07-15 NOTE — Progress Notes (Signed)
eLink Physician-Brief Progress Note Patient Name: Sydney Jacobs DOB: [birth year] MRN: [id number]   Date of Service  07/15/2016  HPI/Events of Note  Bedside nurse notified of continued atrial fibrillation and fluctuating rapid ventricular response. Blood pressure stable. Patient has received metoprolol via tube. Serum magnesium 1.9 & potassium 4.3.   eICU Interventions  1. Continuing telemetry monitoring 2. Continuing scheduled metoprolol 3. Magnesium sulfate 1 g IV 1 now      Intervention Category Major Interventions: Arrhythmia - evaluation and management  Lawanda CousinsJennings Nestor 07/15/2016, 10:42 PM

## 2016-07-15 NOTE — Progress Notes (Signed)
eLink Physician-Brief Progress Note Patient Name: Sydney Jacobs DOB: 21-Nov-1933 MRN: [id number]   Date of Service  07/15/2016  HPI/Events of Note  EKG >> AFIB with RVR - Already on an Amiodarone IV infusion.   eICU Interventions  Will order: 1. Amiodarone 150 mg IV bolus over 10 minutes now.  2. BMP and Mg++ level STAT.      Intervention Category Major Interventions: Arrhythmia - evaluation and management  Sommer,Steven Eugene 07/15/2016, 8:16 PM

## 2016-07-16 ENCOUNTER — Inpatient Hospital Stay (HOSPITAL_COMMUNITY): Payer: Commercial Managed Care - HMO

## 2016-07-16 LAB — BASIC METABOLIC PANEL
ANION GAP: 15 (ref 5–15)
ANION GAP: 11 (ref 5–15)
Anion gap: 14 (ref 5–15)
Anion gap: 15 (ref 5–15)
BUN: 81 mg/dL — ABNORMAL HIGH (ref 6–20)
BUN: 88 mg/dL — ABNORMAL HIGH (ref 6–20)
BUN: 89 mg/dL — ABNORMAL HIGH (ref 6–20)
BUN: 96 mg/dL — AB (ref 6–20)
CALCIUM: 9.1 mg/dL (ref 8.9–10.3)
CALCIUM: 8.9 mg/dL (ref 8.9–10.3)
CHLORIDE: 96 mmol/L — AB (ref 101–111)
CO2: 32 mmol/L (ref 22–32)
CO2: 30 mmol/L (ref 22–32)
CO2: 34 mmol/L — ABNORMAL HIGH (ref 22–32)
CO2: 32 mmol/L (ref 22–32)
CREATININE: 2.31 mg/dL — AB (ref 0.44–1.00)
CREATININE: 2.55 mg/dL — AB (ref 0.44–1.00)
CREATININE: 2.57 mg/dL — AB (ref 0.44–1.00)
Calcium: 9.1 mg/dL (ref 8.9–10.3)
Calcium: 9 mg/dL (ref 8.9–10.3)
Chloride: 94 mmol/L — ABNORMAL LOW (ref 101–111)
Chloride: 94 mmol/L — ABNORMAL LOW (ref 101–111)
Chloride: 92 mmol/L — ABNORMAL LOW (ref 101–111)
Creatinine, Ser: 2.55 mg/dL — ABNORMAL HIGH (ref 0.44–1.00)
GFR calc non Af Amer: 19 mL/min — ABNORMAL LOW (ref >60)
GFR calc non Af Amer: 17 mL/min — ABNORMAL LOW (ref >60)
GFR, EST AFRICAN AMERICAN: 22 mL/min — AB (ref >60)
GFR, EST AFRICAN AMERICAN: 19 mL/min — AB (ref >60)
GFR, EST AFRICAN AMERICAN: 19 mL/min — AB (ref >60)
GFR, EST AFRICAN AMERICAN: 19 mL/min — AB (ref >60)
GFR, EST NON AFRICAN AMERICAN: 17 mL/min — AB (ref >60)
GFR, EST NON AFRICAN AMERICAN: 17 mL/min — AB (ref >60)
GLUCOSE: 189 mg/dL — AB (ref 65–99)
Glucose, Bld: 131 mg/dL — ABNORMAL HIGH (ref 65–99)
Glucose, Bld: 188 mg/dL — ABNORMAL HIGH (ref 65–99)
Glucose, Bld: 158 mg/dL — ABNORMAL HIGH (ref 65–99)
POTASSIUM: 5.3 mmol/L — AB (ref 3.5–5.1)
POTASSIUM: 4.3 mmol/L (ref 3.5–5.1)
Potassium: 4.3 mmol/L (ref 3.5–5.1)
Potassium: 4.1 mmol/L (ref 3.5–5.1)
SODIUM: 142 mmol/L (ref 135–145)
SODIUM: 139 mmol/L (ref 135–145)
SODIUM: 139 mmol/L (ref 135–145)
SODIUM: 139 mmol/L (ref 135–145)

## 2016-07-16 LAB — BLOOD GAS, ARTERIAL
Acid-Base Excess: 9.3 mmol/L — ABNORMAL HIGH (ref 0.0–2.0)
BICARBONATE: 34 mmol/L — AB (ref 20.0–28.0)
Drawn by: 311011
FIO2: 50
LHR: 16 {breaths}/min
O2 Saturation: 99.5 %
PATIENT TEMPERATURE: 98.6
PCO2 ART: 52.4 mmHg — AB (ref 32.0–48.0)
PEEP: 10 cmH2O
VT: 380 mL
pH, Arterial: 7.428 (ref 7.350–7.450)
pO2, Arterial: 158 mmHg — ABNORMAL HIGH (ref 83.0–108.0)

## 2016-07-16 LAB — GLUCOSE, CAPILLARY
GLUCOSE-CAPILLARY: 136 mg/dL — AB (ref 65–99)
GLUCOSE-CAPILLARY: 196 mg/dL — AB (ref 65–99)
Glucose-Capillary: 141 mg/dL — ABNORMAL HIGH (ref 65–99)
Glucose-Capillary: 164 mg/dL — ABNORMAL HIGH (ref 65–99)
Glucose-Capillary: 199 mg/dL — ABNORMAL HIGH (ref 65–99)
Glucose-Capillary: 203 mg/dL — ABNORMAL HIGH (ref 65–99)

## 2016-07-16 LAB — CBC
HCT: 31.3 % — ABNORMAL LOW (ref 36.0–46.0)
HEMOGLOBIN: 9.4 g/dL — AB (ref 12.0–15.0)
MCH: 27.7 pg (ref 26.0–34.0)
MCHC: 30 g/dL (ref 30.0–36.0)
MCV: 92.3 fL (ref 78.0–100.0)
Platelets: 152 10*3/uL (ref 150–400)
RBC: 3.39 MIL/uL — AB (ref 3.87–5.11)
RDW: 16 % — ABNORMAL HIGH (ref 11.5–15.5)
WBC: 10.7 10*3/uL — ABNORMAL HIGH (ref 4.0–10.5)

## 2016-07-16 LAB — MAGNESIUM: MAGNESIUM: 2.3 mg/dL (ref 1.7–2.4)

## 2016-07-16 LAB — PHOSPHORUS: PHOSPHORUS: 2.9 mg/dL (ref 2.5–4.6)

## 2016-07-16 MED ORDER — DOCUSATE SODIUM 50 MG/5ML PO LIQD
100.0000 mg | Freq: Two times a day (BID) | ORAL | Status: DC
  Administered 2016-07-16 – 2016-07-21 (×11): 100 mg via ORAL
  Filled 2016-07-16 (×11): qty 10

## 2016-07-16 MED ORDER — METOPROLOL TARTRATE 25 MG PO TABS
25.0000 mg | ORAL_TABLET | ORAL | Status: DC
Start: 2016-07-16 — End: 2016-07-20
  Administered 2016-07-16 – 2016-07-20 (×14): 25 mg
  Filled 2016-07-16 (×17): qty 1

## 2016-07-16 MED ORDER — DOCUSATE SODIUM 100 MG PO CAPS
100.0000 mg | ORAL_CAPSULE | Freq: Two times a day (BID) | ORAL | Status: DC
Start: 2016-07-16 — End: 2016-07-16

## 2016-07-16 MED ORDER — METOLAZONE 5 MG PO TABS
10.0000 mg | ORAL_TABLET | Freq: Once | ORAL | Status: AC
  Administered 2016-07-16: 10 mg via ORAL
  Filled 2016-07-16: qty 2

## 2016-07-16 NOTE — Progress Notes (Addendum)
Patient Name: Sydney Jacobs Date of Encounter: 07/16/2016  Hospital Problem List     Principal Problem:   Acute on chronic diastolic congestive heart failure (HCC) Active Problems:   Elevated troponin   Chronic kidney disease (CKD), stage IV (severe) (HCC)   Essential hypertension   COPD (chronic obstructive pulmonary disease) (HCC)   Retroperitoneal hematoma in 8/17   PAF (paroxysmal atrial fibrillation) (HCC)   AAA (abdominal aortic aneurysm) (HCC)   Chronic respiratory failure (HCC)   Normocytic anemia   Hypokalemia   Atrial fibrillation with RVR (HCC)   Acute respiratory failure (HCC)   Acute pulmonary edema [facility])    Patient Profile     82yo female with multiple medical problems including chronic respiratory failure on the basis of HF and COPD on home O2, CKD III, pulmonary nodule, PAF, recent cath without significant CAD c/b retroperitoneal bleed post procedure. Presented 9/3 with worsening SOB. She was admitted patient is ventilated.   Subjective   Intubated and awake on the vent.  Denies pain.  Heart rate increases with agitation.  Rate up to the 160s at times.   Inpatient Medications    . atorvastatin  20 mg Oral q1800  . chlorhexidine  15 mL Mouth Rinse BID  . famotidine  20 mg Oral QHS  . feeding supplement (PRO-STAT SUGAR FREE 64)  30 mL Per Tube Daily  . insulin aspart  0-9 Units Subcutaneous Q4H  . ipratropium  0.5 mg Nebulization TID  . levalbuterol  1.25 mg Nebulization TID  . mouth rinse  15 mL Mouth Rinse 10 times per day  . metoprolol tartrate  25 mg Per Tube Q12H  . potassium chloride  40 mEq Oral Once  . rocuronium  5 mg/kg Intravenous Once  . sodium chloride flush  3 mL Intravenous Q12H    Vital Signs    Vitals:   07/16/16 0518 07/16/16 0619 07/16/16 0700 07/16/16 0745  BP: (!) 141/87 114/65 (!) 130/107 115/84  Pulse: (!) 123 (!) 143 94 (!) 115  Resp: (!) 27 (!) 22 (!) 22 17  Temp:   99.3 F (37.4 C)   TempSrc:   Oral   SpO2: 94%  94% 95% 91%  Weight:      Height:        Intake/Output Summary (Last 24 hours) at 07/16/16 0802 Last data filed at 07/16/16 0600  Gross per 24 hour  Intake          2208.03 ml  Output             1800 ml  Net           408.03 ml   Filed Weights   07/14/16 0500 07/15/16 0500 07/16/16 0500  Weight: 119 lb 0.8 oz (54 kg) 117 lb 8.1 oz (53.3 kg) 113 lb 8.6 oz (51.5 kg)    Physical Exam    GEN:  Acutely and chronically ill. Neck: Supple, no JVD, carotid bruits, or masses. Cardiac: Irregular, no rubs, or gallops. No clubbing, cyanosis, no edema.  Radials/DP/PT 2+ and equal bilaterally.  Respiratory:  Respirations decreased bilateral. GI: Soft, nontender, nondistended, BS + x 4. Neuro:  Answers questions and moves all extremities.     Labs    CBC  Recent Labs  07/15/16 0735 07/16/16 0224  WBC 8.7 10.7*  HGB 9.9* 9.4*  HCT 31.5* 31.3*  MCV 89.2 92.3  PLT 151 152   Basic Metabolic Panel  Recent Labs  07/15/16 1802 07/15/16 2109  07/16/16 0224  NA  --  143 142  K  --  4.3 5.3*  CL  --  95* 96*  CO2  --  32 32  GLUCOSE  --  157* 131*  BUN  --  75* 81*  CREATININE  --  2.41* 2.31*  CALCIUM  --  9.0 9.1  MG 1.8 1.9 2.3  PHOS 3.2  --  2.9   Liver Function Tests No results for input(s): AST, ALT, ALKPHOS, BILITOT, PROT, ALBUMIN in the last 72 hours. No results for input(s): LIPASE, AMYLASE in the last 72 hours. Cardiac Enzymes No results for input(s): CKTOTAL, CKMB, CKMBINDEX, TROPONINI in the last 72 hours. BNP Invalid input(s): POCBNP D-Dimer No results for input(s): DDIMER in the last 72 hours. Hemoglobin A1C No results for input(s): HGBA1C in the last 72 hours. Fasting Lipid Panel No results for input(s): CHOL, HDL, LDLCALC, TRIG, CHOLHDL, LDLDIRECT in the last 72 hours. Thyroid Function Tests No results for input(s): TSH, T4TOTAL, T3FREE, THYROIDAB in the last 72 hours.  Invalid input(s): FREET3  Telemetry    Atrial fib with RVR  ECG     NA  Radiology    Ct Abdomen Pelvis Wo Contrast  Result Date: 07/13/2016 CLINICAL DATA:  Anemia. Rule out retroperitoneal hemorrhage. Aneurysm. EXAM: CT ABDOMEN AND PELVIS WITHOUT CONTRAST TECHNIQUE: Multidetector CT imaging of the abdomen and pelvis was performed following the standard protocol without IV contrast. COMPARISON:  CT abdomen pelvis 05/28/2016 FINDINGS: Lower chest: Moderately large bilateral pleural effusions have increased in size since the prior CT. There is compressive atelectasis in both lung bases. Left ventricular enlargement. Calcification of the mitral annulus again noted. Hepatobiliary: Liver gallbladder and bile ducts within normal limits. Pancreas: Negative Spleen: Negative Adrenals/Urinary Tract: Large right upper pole renal cyst measuring 9 x 11 cm. This displaces the kidney inferiorly. No renal hydronephrosis. Atrophic left kidney likely due to renovascular disease. Left renal cyst 4 cm. Sub cm hyperdense nodule left upper pole unchanged. Urinary bladder decompressed with Foley catheter Stomach/Bowel: Stomach and duodenum normal. Negative for bowel obstruction. No bowel mass or edema identified. Sigmoid diverticulosis. Negative for diverticulitis. Vascular/Lymphatic: Atherosclerotic calcifications are present diffusely. Infrarenal abdominal aortic aneurysm measures 3.9 x 4.2 cm. Intimal calcifications remain displaced into the lumen unchanged. No retroperitoneal hemorrhage around the aneurysm. No evidence of aneurysm leak. Atherosclerotic calcification extends throughout the iliac arteries bilaterally. Reproductive: Hysterectomy.  No pelvic mass. Other: Right inguinal hematoma has improved now measuring 2.4 cm. Extraperitoneal hemorrhage in the right iliac fossa has nearly completely resolved. No new area of hemorrhage. Musculoskeletal: Lumbar disc and facet degeneration. No acute skeletal abnormality right hip replacement. Mild levoscoliosis. IMPRESSION: Near complete resolution  of right inguinal hematoma and extraperitoneal hematoma on the right. No new area of hemorrhage. Atherosclerotic infrarenal abdominal aortic aneurysm measures 3.9 x 4.2 cm without evidence of leak. Moderately large bilateral pleural effusions with bibasilar atelectasis. Atrophic left kidney. Bilateral renal cyst. Large right renal cyst. Electronically Signed   By: Marlan Palauharles  Clark M.D.   On: 07/13/2016 11:21   Dg Chest Port 1 View  Result Date: 07/15/2016 CLINICAL DATA:  Intubation, essential hypertension, COPD, CHF, stage III chronic kidney disease, abdominal aortic aneurysm, atrial fibrillation EXAM: PORTABLE CHEST 1 VIEW COMPARISON:  Portable exam 0537 hours compared to 07/14/2016 FINDINGS: Tip of endotracheal tube within 6 mm of carina, recommend withdrawal 1-2 cm. Enlargement of cardiac silhouette. Atherosclerotic calcification aorta. Diffuse BILATERAL pulmonary infiltrates again identified question edema versus pneumonia. Bibasilar effusions. No pneumothorax. Osseous  demineralization. IMPRESSION: Tip of endotracheal tube within 6 mm of carina; recommend withdrawal 1-2 cm. Persistent diffuse BILATERAL airspace infiltrates question edema versus infection with associated bibasilar effusions. Findings called to patient's nurse Dois Davenport RN on [facility] on 07/15/2016 at 0843 hours. Electronically Signed   By: Ulyses Southward M.D.   On: 07/15/2016 08:45   Dg Chest Port 1 View  Result Date: 07/14/2016 CLINICAL DATA:  Intubation . EXAM: PORTABLE CHEST 1 VIEW FINDINGS: Endotracheal tube 1 cm above the lower portion of the carina, withdrawal of approximately 2 cm suggested. NG tube noted with tip below left hemidiaphragm. Heart size normal. Diffuse multifocal bilateral pulmonary infiltrates and/or edema are again noted without interim improvement. Bilateral pleural effusions noted. No pneumothorax. IMPRESSION: 1. Endotracheal tube 1 cm above the lower portion of the carina. Withdrawal of approximately 2 cm suggested. 2. NG  tube noted with tip below left hemidiaphragm. 3. Persistent multifocal pulmonary infiltrates are and/or edema again noted. Bilateral pleural effusions again noted. These results will be called to the ordering clinician or representative by the Radiologist Assistant, and communication documented in the PACS or zVision Dashboard. Electronically Signed   By: Maisie Fus  Register   On: 07/14/2016 12:42   Dg Chest Port 1 View  Result Date: 07/14/2016 CLINICAL DATA:  82 year old female with acute respiratory failure. EXAM: PORTABLE CHEST 1 VIEW COMPARISON:  Chest radiograph dated 07/13/2016 FINDINGS: There has been interval development of a small-to-moderate right pleural effusion, new from prior study. Stable appearing left-sided pleural effusion. There is persistent vascular congestion and interstitial edema. Superimposed pneumonia is not excluded. There is no pneumothorax. Stable mild cardiomegaly. No acute osseous pathology. IMPRESSION: Congestive heart failure with pulmonary edema and interval development of a small to moderate right pleural effusion. Clinical correlation and follow-up recommended. Electronically Signed   By: Elgie Collard M.D.   On: 07/14/2016 01:19   Dg Chest Port 1 View  Result Date: 07/13/2016 CLINICAL DATA:  Congestive heart failure EXAM: PORTABLE CHEST 1 VIEW COMPARISON:  July 12, 2016 FINDINGS: There is widespread interstitial edema with left pleural effusion. There is patchy airspace consolidation in the medial bases bilaterally. There is cardiomegaly with pulmonary venous hypertension. There is atherosclerotic calcification in the aorta. There is no new opacity evident. IMPRESSION: Findings indicative of congestive heart failure without appreciable change from 1 day prior. Stable cardiac silhouette. There is aortic atherosclerosis. Electronically Signed   By: Bretta Bang III M.D.   On: 07/13/2016 07:27   Dg Chest Port 1 View  Result Date: 07/12/2016 CLINICAL DATA:  Acute  respiratory failure, history of CHF, COPD, chronic renal insufficiency EXAM: PORTABLE CHEST 1 VIEW COMPARISON:  Portable chest x-ray of July 11, 2016 FINDINGS: The lungs are adequately inflated. The left hemidiaphragm remains obscured. There is some obscuration of the right hemidiaphragm now. The pulmonary interstitial markings are increased bilaterally. There are confluent alveolar opacities in the left upper lobe. The heart is normal in size. There is calcification in the wall of the aortic arch. The trachea is midline. The bony thorax exhibits no acute abnormality. IMPRESSION: Slight interval worsening of pulmonary interstitial edema and bilateral pleural effusions. Persistent pulmonary vascular engorgement. Aortic atherosclerosis. Electronically Signed   By: David  [place] M.D.   On: 07/12/2016 07:12   Dg Chest Port 1 View  Result Date: 07/11/2016 CLINICAL DATA:  Acute on chronic respiratory failure. History of COPD. Worsening shortness of breath. EXAM: PORTABLE CHEST 1 VIEW COMPARISON:  Single-view of the chest 07/10/2016 and 06/14/2016. FINDINGS: Left worse  than right airspace disease persists appearing aeration improved improved on the right since the most recent exam. Left pleural effusion is again identified. Heart size is upper normal. Aortic atherosclerosis is seen. IMPRESSION: Extensive bilateral airspace disease shows improvement on the right since the most recent exam. No new abnormality. Left pleural effusion. Electronically Signed   By: Drusilla Kanner M.D.   On: 07/11/2016 19:34   Dg Chest Portable 1 View  Result Date: 07/10/2016 CLINICAL DATA:  Shortness of breath and bilateral lower extremity edema. EXAM: PORTABLE CHEST 1 VIEW COMPARISON:  Single-view of the chest 06/14/2016 and 06/12/2016. FINDINGS: Bilateral airspace disease seen on the most recent examination has worsened. Small to moderate left pleural effusion is unchanged. There is a small right pleural effusion which appears  increased. Atherosclerosis is noted. No pneumothorax. IMPRESSION: Worsened bilateral airspace disease compatible with increased pulmonary edema and/or pneumonia. New small right pleural effusion. Small to moderate left pleural effusion is unchanged since the most recent study. Atherosclerosis. Electronically Signed   By: Drusilla Kanner M.D.   On: 07/10/2016 20:09   Dg Abd Portable 1v  Result Date: 07/14/2016 CLINICAL DATA:  Orogastric tube placement EXAM: PORTABLE ABDOMEN - 1 VIEW COMPARISON:  Portable exam 1221 hours compared to CT abdomen and pelvis 07/13/2013 FINDINGS: Tip of orogastric tube projects over proximal to mid stomach. Visualized bowel gas pattern normal. Aortic atherosclerotic calcification extending and iliac arteries. LEFT lower lobe atelectasis versus consolidation with associated bibasilar effusions. Bones demineralized. IMPRESSION: Tip of orogastric tube projects over proximal to mid stomach. Bibasilar effusions with atelectasis versus consolidation in LEFT lower lobe. Aortic atherosclerosis. Electronically Signed   By: Ulyses Southward M.D.   On: 07/14/2016 12:45    Assessment & Plan    ACUTE ON CHRONIC RESPIRATORY FAILURE:  Per CCM.   ACUTE ON CHRONIC DIASTOLIC HF:  Continues IV Lasix drip.  Down about 3 liters since admission.   CKD IV:  Creat is relatively stable.    PAF:    On IV amiodarone. Rate is still elevated given overall situation.   Rate and BP fluctuate.  I will increase the frequency of her metoprolol.  If she continues to go high we could rebolus with amio and increase the drip.  She is not on full dose anticoagulation because of a previous retroperitoneal bleed.  Overall she is at risk for emboli with atrial fib.  However, I agree that she is high risk for bleed with her very frail condition and multiple comorbid conditions if she were on full dose heparin/lovenox.    Signed, Rollene Rotunda, MD  07/16/2016, 8:02 AM

## 2016-07-16 NOTE — Progress Notes (Signed)
PULMONARY / CRITICAL CARE MEDICINE   Name: Tandy Gawlsie H Castrogiovanni MRN: [id number] DOB: 10/02/1934    ADMISSION DATE:  07/10/2016 CONSULTATION DATE:  9/4  REFERRING MD:  Sunnie Nielsenegalado ([facility])   CHIEF COMPLAINT:  Respiratory failure   BRIEF: 82 y/o female with multiple medical problems on oxygen at home intubated on 9/7 in the setting of hypoxemic respiratory failure likely due to diastolic heart failure complicated by Afib and COPD.   SUBJECTIVE:    VITAL SIGNS: BP 125/67 (BP Location: Left Leg)   Pulse (!) 122   Temp 98.2 F (36.8 C) (Oral)   Resp 17   Ht 5' (1.524 m)   Wt 113 lb 8.6 oz (51.5 kg)   SpO2 95%   BMI 22.17 kg/m   HEMODYNAMICS:    VENTILATOR SETTINGS: Vent Mode: PRVC FiO2 (%):  [50 %] 50 % Set Rate:  [16 bmp] 16 bmp Vt Set:  [380 mL] 380 mL PEEP:  [10 cmH20] 10 cmH20 Plateau Pressure:  [15 cmH20-22 cmH20] 16 cmH20  INTAKE / OUTPUT: I/O last 3 completed shifts: In: 3536.8 [I.V.:1376.8; NG/GT:1760; IV Piggyback:400] Out: 3075 [Urine:3075]  PHYSICAL EXAMINATION: General:  Chronically ill appearing on vent HENT: NCAT ETT in place PULM crackles bilaterally, vent supported breaths CV: Irreg irreg, tachycardic GI: BS+, soft, nontender MSK: normal bulk and tone Derm: some bruising Neuro: opens eyes to voice, maew  LABS:  BMET  Recent Labs Lab 07/15/16 0448 07/15/16 2109 07/16/16 0224  NA 144 143 142  K 4.6 4.3 5.3*  CL 98* 95* 96*  CO2 33* 32 32  BUN 69* 75* 81*  CREATININE 2.16* 2.41* 2.31*  GLUCOSE 122* 157* 131*   Electrolytes  Recent Labs Lab 07/15/16 0448 07/15/16 1802 07/15/16 2109 07/16/16 0224  CALCIUM 8.6*  --  9.0 9.1  MG 1.9 1.8 1.9 2.3  PHOS 3.9 3.2  --  2.9   CBC  Recent Labs Lab 07/15/16 0448 07/15/16 0735 07/16/16 0224  WBC 9.2 8.7 10.7*  HGB 10.0* 9.9* 9.4*  HCT 32.3* 31.5* 31.3*  PLT 175 151 152   Coag's  Recent Labs Lab 07/10/16 1957  INR 1.04   Sepsis Markers  Recent Labs Lab 07/10/16 2012  07/10/16 2254 07/10/16 2300 07/12/16 0458 07/14/16 0534  LATICACIDVEN 0.84  --  0.51  --   --   PROCALCITON  --  0.13  --  0.21 0.19   ABG  Recent Labs Lab 07/14/16 1302 07/15/16 0500 07/16/16 0500  PHART 7.463* 7.424 7.428  PCO2ART 54.6* 55.9* 52.4*  PO2ART 272.0* 73.3* 158*   Liver Enzymes  Recent Labs Lab 07/10/16 1957  AST 12*  ALT 11*  ALKPHOS 51  BILITOT 0.8  ALBUMIN 3.2*   Cardiac Enzymes  Recent Labs Lab 07/11/16 0250 07/11/16 0813 07/11/16 1447  TROPONINI 0.04* 0.04* 0.04*   Glucose  Recent Labs Lab 07/15/16 1557 07/15/16 2058 07/16/16 0010 07/16/16 0400 07/16/16 0729 07/16/16 1153  GLUCAP 179* 168* 199* 164* 136* 196*   Imaging Dg Chest Port 1 View  Result Date: 07/16/2016 CLINICAL DATA:  Endotracheal tube placement. EXAM: PORTABLE CHEST 1 VIEW COMPARISON:  Radiographs October 14, 2016. FINDINGS: Stable cardiomediastinal silhouette. Atherosclerosis of thoracic aorta is noted. Endotracheal and nasogastric tubes are in grossly good position. Stable bilateral basilar lung opacities are noted concerning for edema with associated pleural effusions. No pneumothorax is noted. Bony thorax is unremarkable. IMPRESSION: Endotracheal and nasogastric tubes in grossly good position. Stable bilateral basilar edema and pleural effusions are noted. Aortic atherosclerosis. Electronically  Signed   By: Lupita Raider, M.D.   On: 07/16/2016 09:31   STUDIES:  2D echo 8/7>>> EF 60-65%, grade 2 diastolic dysfunction, mod MR  CULTURES: None  ANTIBIOTICS: None  SIGNIFICANT EVENTS: Intubated 9/7  LINES/TUBES: ETT 9/7>>>  DISCUSSION: 82yo female with acute on chronic respiratory failure r/t decompensated heart failure with underlying COPD  ASSESSMENT / PLAN:  PULMONARY Acute on chronic respiratory failure with hypoxemia due to decompensated diastolic heart failure with underlying COPD  Bilateral Pleural effusion L>R COPD without acute exacerbation  Known  pulmonary nodule  P:   Continue full vent support VAP prevention Continue diuresis No SBT 07/16/2016 due to high oxygen needs Wean PEEP, FiO2 to maintain O2 saturation > 90% May need thoracentesis  CARDIOVASCULAR Acute on chronic diastolic heart failure> exam 9/9 supports ongoing volume overload  AFib with RVR 9/6 HTN  P:  Cardiology following Lopressor and amiodarone per cardiology Continue diuresis> furosemide gtt to 15mg /hr 07/16/2016 and add another dose of metolazone  RENAL CKD IV > BUN elevated Hypokalemia  P:   Replete electrolytes as needed Monitor BMET and UOP Replace electrolytes as needed Lasix drip at 15 mg/hr again 07/16/2016 Zaroxolyn 10 mg PO x1 today Hold albumin  GASTROINTESTINAL No active issue  P:   Tube feedings per protocol pepcid  HEMATOLOGIC Chronic anemia History of retroperitoneal bleeding P:  Hold anticoagulation   INFECTIOUS No active issue  P:   Monitor WBC, fever curve off abx   ENDOCRINE  Hyperglycemia - no known hx DM   P:   Monitor glucose on chem   NEUROLOGIC Sedation needs for vent synchrony P:   Versed PRN Fentanyl drip Supportive care  FAMILY  - Updates: will update family on 9/9  CC time 45 minutes   Heber [place], MD [place] [facility] Pager: [phone] Cell: [phone] After 3pm or if no response, call [phone]

## 2016-07-16 NOTE — Progress Notes (Signed)
Called eLink and informed MD Nestor of morning potassium 5.3.

## 2016-07-16 NOTE — Progress Notes (Signed)
[facility]  Met with many, many family members. Explained that we are trying to diurese her, but her kidneys aren't helping much and if we dialyze her we are going to commit her to many more days in the ICU.  Will continue diuretics, if no improvement by 9/10 then consult renal  Roselie Awkward, MD [place] [facility] Pager: [phone] Cell: [phone] After 3pm or if no response, call [phone]

## 2016-07-16 NOTE — Progress Notes (Signed)
eLink Physician-Brief Progress Note Patient Name: Sydney Jacobs DOB: 29-Mar-1934 MRN: [id number]   Date of Service  07/16/2016  HPI/Events of Note  Notified by bedside nurse of mild hyperkalemia to 5.3. Renal function appears stable with creatinine 2.3 & BUN 81. No evidence of metabolic acidosis. ABG shows no evidence of respiratory acidosis with chronic hypercarbia.   eICU Interventions  1. Discontinuing scheduled potassium 2. Continuing slow to monitoring 3. Repeat basic metabolic panel at noon      Intervention Category Intermediate Interventions: Electrolyte abnormality - evaluation and management  Lawanda CousinsJennings Nestor 07/16/2016, 5:25 AM

## 2016-07-17 ENCOUNTER — Inpatient Hospital Stay (HOSPITAL_COMMUNITY): Payer: Commercial Managed Care - HMO

## 2016-07-17 LAB — BASIC METABOLIC PANEL
Anion gap: 13 (ref 5–15)
BUN: 97 mg/dL — AB (ref 6–20)
CALCIUM: 8.7 mg/dL — AB (ref 8.9–10.3)
CO2: 35 mmol/L — ABNORMAL HIGH (ref 22–32)
Chloride: 90 mmol/L — ABNORMAL LOW (ref 101–111)
Creatinine, Ser: 2.74 mg/dL — ABNORMAL HIGH (ref 0.44–1.00)
GFR calc Af Amer: 18 mL/min — ABNORMAL LOW (ref >60)
GFR, EST NON AFRICAN AMERICAN: 15 mL/min — AB (ref >60)
GLUCOSE: 126 mg/dL — AB (ref 65–99)
Potassium: 3.7 mmol/L (ref 3.5–5.1)
Sodium: 138 mmol/L (ref 135–145)

## 2016-07-17 LAB — TYPE AND SCREEN
ABO/RH(D): B POS
ANTIBODY SCREEN: NEGATIVE
UNIT DIVISION: 0
Unit division: 0

## 2016-07-17 LAB — CBC
HEMATOCRIT: 30 % — AB (ref 36.0–46.0)
Hemoglobin: 9.2 g/dL — ABNORMAL LOW (ref 12.0–15.0)
MCH: 27.8 pg (ref 26.0–34.0)
MCHC: 30.7 g/dL (ref 30.0–36.0)
MCV: 90.6 fL (ref 78.0–100.0)
Platelets: 146 10*3/uL — ABNORMAL LOW (ref 150–400)
RBC: 3.31 MIL/uL — ABNORMAL LOW (ref 3.87–5.11)
RDW: 15.8 % — AB (ref 11.5–15.5)
WBC: 14.6 10*3/uL — ABNORMAL HIGH (ref 4.0–10.5)

## 2016-07-17 LAB — GLUCOSE, CAPILLARY
GLUCOSE-CAPILLARY: 156 mg/dL — AB (ref 65–99)
Glucose-Capillary: 130 mg/dL — ABNORMAL HIGH (ref 65–99)
Glucose-Capillary: 147 mg/dL — ABNORMAL HIGH (ref 65–99)
Glucose-Capillary: 149 mg/dL — ABNORMAL HIGH (ref 65–99)
Glucose-Capillary: 164 mg/dL — ABNORMAL HIGH (ref 65–99)
Glucose-Capillary: 188 mg/dL — ABNORMAL HIGH (ref 65–99)

## 2016-07-17 MED ORDER — DEXTROSE 5 % IV SOLN
160.0000 mg | Freq: Three times a day (TID) | INTRAVENOUS | Status: DC
  Administered 2016-07-17 – 2016-07-19 (×7): 160 mg via INTRAVENOUS
  Filled 2016-07-17 (×9): qty 16

## 2016-07-17 NOTE — Progress Notes (Signed)
Patient Name: Sydney Jacobs Date of Encounter: 07/17/2016  Hospital Problem List     Principal Problem:   Acute on chronic diastolic congestive heart failure (HCC) Active Problems:   Elevated troponin   Chronic kidney disease (CKD), stage IV (severe) (HCC)   Essential hypertension   COPD (chronic obstructive pulmonary disease) (HCC)   Retroperitoneal hematoma in 8/17   PAF (paroxysmal atrial fibrillation) (HCC)   AAA (abdominal aortic aneurysm) (HCC)   Chronic respiratory failure (HCC)   Normocytic anemia   Hypokalemia   Atrial fibrillation with RVR (HCC)   Acute respiratory failure with hypoxemia (HCC)   Acute pulmonary edema [facility])    Patient Profile     82yo female with multiple medical problems including chronic respiratory failure on the basis of HF and COPD on home O2, CKD III, pulmonary nodule, PAF, recent cath without significant CAD c/b retroperitoneal bleed post procedure. Presented 9/3 with worsening SOB. She was admitted patient is ventilated.   Subjective   Intubated and awake on the vent.  Denies pain.  Heart rate increases with agitation.  Rate up to the 160s at times.   Inpatient Medications    . atorvastatin  20 mg Oral q1800  . chlorhexidine  15 mL Mouth Rinse BID  . docusate  100 mg Oral BID  . famotidine  20 mg Oral QHS  . feeding supplement (PRO-STAT SUGAR FREE 64)  30 mL Per Tube Daily  . insulin aspart  0-9 Units Subcutaneous Q4H  . ipratropium  0.5 mg Nebulization TID  . levalbuterol  1.25 mg Nebulization TID  . mouth rinse  15 mL Mouth Rinse 10 times per day  . metoprolol tartrate  25 mg Per Tube Q4H  . rocuronium  5 mg/kg Intravenous Once  . sodium chloride flush  3 mL Intravenous Q12H    Vital Signs    Vitals:   07/17/16 0600 07/17/16 0700 07/17/16 0751 07/17/16 0756  BP: 96/83 102/83    Pulse: (!) 120 (!) 52 (!) 128   Resp: (!) 24 16 16    Temp:   98.3 F (36.8 C)   TempSrc:   Oral   SpO2: 97% 98% 96% 97%  Weight:        Height:        Intake/Output Summary (Last 24 hours) at 07/17/16 0812 Last data filed at 07/17/16 0500  Gross per 24 hour  Intake          1936.12 ml  Output             1375 ml  Net           561.12 ml   Filed Weights   07/15/16 0500 07/16/16 0500 07/17/16 0330  Weight: 117 lb 8.1 oz (53.3 kg) 113 lb 8.6 oz (51.5 kg) 116 lb 13.5 oz (53 kg)    Physical Exam    GEN:  Acutely and chronically ill. Neck: Supple, no JVD, carotid bruits, or masses. Cardiac: Irregular, no rubs, or gallops. No clubbing, cyanosis, no edema.  Radials/DP/PT 2+ and equal bilaterally.  Respiratory:  Respirations decreased bilateral. GI: Soft, nontender, nondistended, BS + x 4. Neuro:  Answers questions and moves all extremities.     Labs    CBC  Recent Labs  07/16/16 0224 07/17/16 0131  WBC 10.7* 14.6*  HGB 9.4* 9.2*  HCT 31.3* 30.0*  MCV 92.3 90.6  PLT 152 146*   Basic Metabolic Panel  Recent Labs  07/15/16 1802 07/15/16 2109 07/16/16 0224  07/16/16 1906 07/17/16 0131  NA  --  143 142  < > 139 138  K  --  4.3 5.3*  < > 4.1 3.7  CL  --  95* 96*  < > 92* 90*  CO2  --  32 32  < > 32 35*  GLUCOSE  --  157* 131*  < > 158* 126*  BUN  --  75* 81*  < > 96* 97*  CREATININE  --  2.41* 2.31*  < > 2.57* 2.74*  CALCIUM  --  9.0 9.1  < > 8.9 8.7*  MG 1.8 1.9 2.3  --   --   --   PHOS 3.2  --  2.9  --   --   --   < > = values in this interval not displayed. Liver Function Tests No results for input(s): AST, ALT, ALKPHOS, BILITOT, PROT, ALBUMIN in the last 72 hours. No results for input(s): LIPASE, AMYLASE in the last 72 hours. Cardiac Enzymes No results for input(s): CKTOTAL, CKMB, CKMBINDEX, TROPONINI in the last 72 hours. BNP Invalid input(s): POCBNP D-Dimer No results for input(s): DDIMER in the last 72 hours. Hemoglobin A1C No results for input(s): HGBA1C in the last 72 hours. Fasting Lipid Panel No results for input(s): CHOL, HDL, LDLCALC, TRIG, CHOLHDL, LDLDIRECT in the last 72  hours. Thyroid Function Tests No results for input(s): TSH, T4TOTAL, T3FREE, THYROIDAB in the last 72 hours.  Invalid input(s): FREET3  Telemetry    Atrial fib with RVR  ECG    NA  Radiology    Ct Abdomen Pelvis Wo Contrast  Result Date: 07/13/2016 CLINICAL DATA:  Anemia. Rule out retroperitoneal hemorrhage. Aneurysm. EXAM: CT ABDOMEN AND PELVIS WITHOUT CONTRAST TECHNIQUE: Multidetector CT imaging of the abdomen and pelvis was performed following the standard protocol without IV contrast. COMPARISON:  CT abdomen pelvis 05/28/2016 FINDINGS: Lower chest: Moderately large bilateral pleural effusions have increased in size since the prior CT. There is compressive atelectasis in both lung bases. Left ventricular enlargement. Calcification of the mitral annulus again noted. Hepatobiliary: Liver gallbladder and bile ducts within normal limits. Pancreas: Negative Spleen: Negative Adrenals/Urinary Tract: Large right upper pole renal cyst measuring 9 x 11 cm. This displaces the kidney inferiorly. No renal hydronephrosis. Atrophic left kidney likely due to renovascular disease. Left renal cyst 4 cm. Sub cm hyperdense nodule left upper pole unchanged. Urinary bladder decompressed with Foley catheter Stomach/Bowel: Stomach and duodenum normal. Negative for bowel obstruction. No bowel mass or edema identified. Sigmoid diverticulosis. Negative for diverticulitis. Vascular/Lymphatic: Atherosclerotic calcifications are present diffusely. Infrarenal abdominal aortic aneurysm measures 3.9 x 4.2 cm. Intimal calcifications remain displaced into the lumen unchanged. No retroperitoneal hemorrhage around the aneurysm. No evidence of aneurysm leak. Atherosclerotic calcification extends throughout the iliac arteries bilaterally. Reproductive: Hysterectomy.  No pelvic mass. Other: Right inguinal hematoma has improved now measuring 2.4 cm. Extraperitoneal hemorrhage in the right iliac fossa has nearly completely resolved. No  new area of hemorrhage. Musculoskeletal: Lumbar disc and facet degeneration. No acute skeletal abnormality right hip replacement. Mild levoscoliosis. IMPRESSION: Near complete resolution of right inguinal hematoma and extraperitoneal hematoma on the right. No new area of hemorrhage. Atherosclerotic infrarenal abdominal aortic aneurysm measures 3.9 x 4.2 cm without evidence of leak. Moderately large bilateral pleural effusions with bibasilar atelectasis. Atrophic left kidney. Bilateral renal cyst. Large right renal cyst. Electronically Signed   By: Marlan Palau M.D.   On: 07/13/2016 11:21   Dg Chest Port 1 View  Result Date: 07/17/2016  CLINICAL DATA:  Acute respiratory failure with hypoxemia. EXAM: PORTABLE CHEST 1 VIEW COMPARISON:  07/16/2016 FINDINGS: 0513 hours. Endotracheal tube tip is 2.8 cm above the base the carina. The NG tube passes into the stomach although the distal tip position is not included on the film. Bilateral interstitial and basilar airspace opacity persists. Small bilateral pleural effusions are again noted. Telemetry leads overlie the chest. IMPRESSION: No substantial change in exam. Electronically Signed   By: Kennith CenterEric  Mansell M.D.   On: 07/17/2016 07:19   Dg Chest Port 1 View  Result Date: 07/16/2016 CLINICAL DATA:  Endotracheal tube placement. EXAM: PORTABLE CHEST 1 VIEW COMPARISON:  Radiographs October 14, 2016. FINDINGS: Stable cardiomediastinal silhouette. Atherosclerosis of thoracic aorta is noted. Endotracheal and nasogastric tubes are in grossly good position. Stable bilateral basilar lung opacities are noted concerning for edema with associated pleural effusions. No pneumothorax is noted. Bony thorax is unremarkable. IMPRESSION: Endotracheal and nasogastric tubes in grossly good position. Stable bilateral basilar edema and pleural effusions are noted. Aortic atherosclerosis. Electronically Signed   By: Lupita RaiderJames  Green Jr, M.D.   On: 07/16/2016 09:31   Dg Chest Port 1  View  Result Date: 07/15/2016 CLINICAL DATA:  Intubation, essential hypertension, COPD, CHF, stage III chronic kidney disease, abdominal aortic aneurysm, atrial fibrillation EXAM: PORTABLE CHEST 1 VIEW COMPARISON:  Portable exam 0537 hours compared to 07/14/2016 FINDINGS: Tip of endotracheal tube within 6 mm of carina, recommend withdrawal 1-2 cm. Enlargement of cardiac silhouette. Atherosclerotic calcification aorta. Diffuse BILATERAL pulmonary infiltrates again identified question edema versus pneumonia. Bibasilar effusions. No pneumothorax. Osseous demineralization. IMPRESSION: Tip of endotracheal tube within 6 mm of carina; recommend withdrawal 1-2 cm. Persistent diffuse BILATERAL airspace infiltrates question edema versus infection with associated bibasilar effusions. Findings called to patient's nurse Dois DavenportSandra RN on [facility] on 07/15/2016 at 0843 hours. Electronically Signed   By: Ulyses SouthwardMark  Boles M.D.   On: 07/15/2016 08:45   Dg Chest Port 1 View  Result Date: 07/14/2016 CLINICAL DATA:  Intubation . EXAM: PORTABLE CHEST 1 VIEW FINDINGS: Endotracheal tube 1 cm above the lower portion of the carina, withdrawal of approximately 2 cm suggested. NG tube noted with tip below left hemidiaphragm. Heart size normal. Diffuse multifocal bilateral pulmonary infiltrates and/or edema are again noted without interim improvement. Bilateral pleural effusions noted. No pneumothorax. IMPRESSION: 1. Endotracheal tube 1 cm above the lower portion of the carina. Withdrawal of approximately 2 cm suggested. 2. NG tube noted with tip below left hemidiaphragm. 3. Persistent multifocal pulmonary infiltrates are and/or edema again noted. Bilateral pleural effusions again noted. These results will be called to the ordering clinician or representative by the Radiologist Assistant, and communication documented in the PACS or [employer] Dashboard. Electronically Signed   By: Maisie Fushomas  Register   On: 07/14/2016 12:42   Dg Chest Port 1 View  Result  Date: 07/14/2016 CLINICAL DATA:  82 year old female with acute respiratory failure. EXAM: PORTABLE CHEST 1 VIEW COMPARISON:  Chest radiograph dated 07/13/2016 FINDINGS: There has been interval development of a small-to-moderate right pleural effusion, new from prior study. Stable appearing left-sided pleural effusion. There is persistent vascular congestion and interstitial edema. Superimposed pneumonia is not excluded. There is no pneumothorax. Stable mild cardiomegaly. No acute osseous pathology. IMPRESSION: Congestive heart failure with pulmonary edema and interval development of a small to moderate right pleural effusion. Clinical correlation and follow-up recommended. Electronically Signed   By: Elgie CollardArash  Radparvar M.D.   On: 07/14/2016 01:19   Dg Chest Port 1 View  Result Date:  07/13/2016 CLINICAL DATA:  Congestive heart failure EXAM: PORTABLE CHEST 1 VIEW COMPARISON:  July 12, 2016 FINDINGS: There is widespread interstitial edema with left pleural effusion. There is patchy airspace consolidation in the medial bases bilaterally. There is cardiomegaly with pulmonary venous hypertension. There is atherosclerotic calcification in the aorta. There is no new opacity evident. IMPRESSION: Findings indicative of congestive heart failure without appreciable change from 1 day prior. Stable cardiac silhouette. There is aortic atherosclerosis. Electronically Signed   By: Bretta Bang III M.D.   On: 07/13/2016 07:27   Dg Chest Port 1 View  Result Date: 07/12/2016 CLINICAL DATA:  Acute respiratory failure, history of CHF, COPD, chronic renal insufficiency EXAM: PORTABLE CHEST 1 VIEW COMPARISON:  Portable chest x-ray of July 11, 2016 FINDINGS: The lungs are adequately inflated. The left hemidiaphragm remains obscured. There is some obscuration of the right hemidiaphragm now. The pulmonary interstitial markings are increased bilaterally. There are confluent alveolar opacities in the left upper lobe. The heart  is normal in size. There is calcification in the wall of the aortic arch. The trachea is midline. The bony thorax exhibits no acute abnormality. IMPRESSION: Slight interval worsening of pulmonary interstitial edema and bilateral pleural effusions. Persistent pulmonary vascular engorgement. Aortic atherosclerosis. Electronically Signed   By: David  [place] M.D.   On: 07/12/2016 07:12   Dg Chest Port 1 View  Result Date: 07/11/2016 CLINICAL DATA:  Acute on chronic respiratory failure. History of COPD. Worsening shortness of breath. EXAM: PORTABLE CHEST 1 VIEW COMPARISON:  Single-view of the chest 07/10/2016 and 06/14/2016. FINDINGS: Left worse than right airspace disease persists appearing aeration improved improved on the right since the most recent exam. Left pleural effusion is again identified. Heart size is upper normal. Aortic atherosclerosis is seen. IMPRESSION: Extensive bilateral airspace disease shows improvement on the right since the most recent exam. No new abnormality. Left pleural effusion. Electronically Signed   By: Drusilla Kanner M.D.   On: 07/11/2016 19:34   Dg Chest Portable 1 View  Result Date: 07/10/2016 CLINICAL DATA:  Shortness of breath and bilateral lower extremity edema. EXAM: PORTABLE CHEST 1 VIEW COMPARISON:  Single-view of the chest 06/14/2016 and 06/12/2016. FINDINGS: Bilateral airspace disease seen on the most recent examination has worsened. Small to moderate left pleural effusion is unchanged. There is a small right pleural effusion which appears increased. Atherosclerosis is noted. No pneumothorax. IMPRESSION: Worsened bilateral airspace disease compatible with increased pulmonary edema and/or pneumonia. New small right pleural effusion. Small to moderate left pleural effusion is unchanged since the most recent study. Atherosclerosis. Electronically Signed   By: Drusilla Kanner M.D.   On: 07/10/2016 20:09   Dg Abd Portable 1v  Result Date: 07/14/2016 CLINICAL DATA:   Orogastric tube placement EXAM: PORTABLE ABDOMEN - 1 VIEW COMPARISON:  Portable exam 1221 hours compared to CT abdomen and pelvis 07/13/2013 FINDINGS: Tip of orogastric tube projects over proximal to mid stomach. Visualized bowel gas pattern normal. Aortic atherosclerotic calcification extending and iliac arteries. LEFT lower lobe atelectasis versus consolidation with associated bibasilar effusions. Bones demineralized. IMPRESSION: Tip of orogastric tube projects over proximal to mid stomach. Bibasilar effusions with atelectasis versus consolidation in LEFT lower lobe. Aortic atherosclerosis. Electronically Signed   By: Ulyses Southward M.D.   On: 07/14/2016 12:45    Assessment & Plan    ACUTE ON CHRONIC RESPIRATORY FAILURE:  Per CCM.  They do not feel that this is a primary pulmonary process but rather is diastolic HF.   CXR is  unchanged.  Net positive fluid balance yesterday despite increased diuresis on Lasix drip.  Renal to see.  Family to discuss options for further escalation of care.   ACUTE ON CHRONIC DIASTOLIC HF:  Continues IV Lasix drip and zaroxolyn.  Not good urine output.   Weight is up.    CKD IV:  Creat is pending today.    PAF:    On IV amiodarone. Rate is better with increased beta blocker.  She seems to be in persistent fib.  However, this is not the main problem as her respiratory status was poor with sinus.  She is not on full dose anticoagulation because of a previous retroperitoneal bleed.  Overall she is at risk for emboli with atrial fib.  However, I agree that she is high risk for bleed with her very frail condition and multiple comorbid conditions if she were on full dose heparin/lovenox.    Signed, Rollene Rotunda, MD  07/17/2016, 8:12 AM

## 2016-07-17 NOTE — Consult Note (Signed)
Renal Service Consult Note [facility]  Sydney Jacobs 07/17/2016 Sol Blazing Requesting Physician:  Dr Nelda Marseille  Reason for Consult:  Acute/ CKD, volume problems HPI: The patient is a 82 y.o. year-old with 27 yr history of HTN, some hx COPD, past smoker (quit 25 yrs ago), diast CHF, AAA, hx CVA, hx L CEA, HL, 3rd admit this summer for pulm edema/ resp failure/ CKD and diast CHF. Has underlying COPD too, not sure how much this is contributing.  Also has progressive renal failure, asked to see for this.    Patient has been treated w bolus IV lasix, then since 9/5 has been on a lasix IV drip.  Metolazone started 9/7.  CXR's no better and weight is no better despite lasix IV. Having probs with rapid afib as well, on IV amio gtt since 9/6. On vent.  Getting scheduled NG Lopressor 25 every 4 hours, and prn IV hydralazine but hasn't needed that. Is off of the IV NTG gtt. BP's are normal to low-normal.  40% on vent, +PEEP 10.    CXR's 9/3 > 9/10 worsening CHF/ effusion I/O - 1.7 L over last 7 days (8.8L in and 10.6 L out) Weights 51.7 admit, peak 55. 5kg, today 53kg No fever UA > negative on 9/3 Renal US May 28 2016 > 16 cm R kidney, 8.4 cm L kidney, echogenic parenchyma L Abd CT 9/6 >  R kidney normal sized to big, but displaced anterior by very large cyst; L kidney markedly atrophic , also has large solitary cyst not affecting function  Date  Creat  eGFR 2008  1.1- 1.4 45- 54 2011  1.0- 1.1 59 - 65 Jul 2017 1.3 - 2.2 23- 42  Aug 2017 1.9- 2.4 21- 28 Sept 3- admit 2.25  23  Sept 10- now 2.74  18  Old chart: Jul '08 - altered speech, L parietal acute CVA by MRI.  70% L ICA stenosis/ 40% R ICA, MRA w poss high grade M2 stenosis on the R. L ok.  Rx ASA.  Sept '08 - L CEA, HTN , CAD, mod-severe PAD of LE's Jul 2-7, 2017 - uncont/ accel HTN, acute / CKD 3 , COPD exac, acute resp failure, ^ trop demand isch; rx with diuresis, steroids/ nebs, pred taper.  July 17- 28, 2017 >  LE edema, orthopnea/ CP > acute resp failure d/t CKD and/or COPD exac, rx with diuretics, dc'd on home O2.  diast HF on echo.  4.7 cm infrarenal AAA. Heart cath 05/26/16, only mild CAD.  A/C renal, Cr approx 2- 2.5 Aug 6 - 11, 2017 > awoke SOB from sleep, no CP , diagnosis was a/c resp failure due to acute CHF and underlying COPD. Rx w lasix/ nebs/ levaquin/ steroids.  Has afib , not AC candidate w RP bleed after recent cath.  Back to NSR.  CKD IV.     ROS  n/a   Past Medical History  Past Medical History:  Diagnosis Date  . AAA (abdominal aortic aneurysm) ([place])    a. 4.4cm by CT abd 05/29/16.  Marland Kitchen Anemia, unspecified   . Chronic diastolic CHF (congestive heart failure) ([place])   . Chronic respiratory failure ([place])   . CKD (chronic kidney disease) stage 3, GFR 30-59 ml/min   . COPD (chronic obstructive pulmonary disease) ([place])   . Essential hypertension   . History of stroke   . Hyperlipidemia   . Mild CAD    a. LHC 05/2016 - 25% pRCA,  25% ramus, and mildly elevated LVEDP. c/b post-cath RPH.  . On home oxygen therapy   . PAF (paroxysmal atrial fibrillation) ([place]) 06/2016   a. dx 06/2016 - not a candidate for anticoag given recent retroperitoneal hematoma.  . Pulmonary nodule    Left upper lobe 1 cm pulmonary nodule - chest CT July 2017  . Retroperitoneal hematoma    a. after cath 05/2016 requiring txfusion.   Past Surgical History  Past Surgical History:  Procedure Laterality Date  . ABDOMINAL HYSTERECTOMY    . APPENDECTOMY    . CARDIAC CATHETERIZATION N/A 05/26/2016   Procedure: Left Heart Cath and Coronary Angiography;  Surgeon: Jettie Booze, MD;  Location: [place] [facility];  Service: Cardiovascular;  Laterality: N/A;  . CAROTID ENDARTERECTOMY Left   . HEMORRHOID SURGERY    . PARTIAL HIP ARTHROPLASTY    . TONSILLECTOMY     Family History  Family History  Problem Relation Age of Onset  . Hypertension Mother   . Hypertension Father    Social History  reports that she  has quit smoking. Her smoking use included Cigarettes. She has never used smokeless tobacco. She reports that she does not drink alcohol or use drugs. Allergies  Allergies  Allergen Reactions  . Sulfa Antibiotics Other (See Comments)    Unknown allergic reaction   Home medications Prior to Admission medications   Medication Sig Start Date End Date Taking? Authorizing Provider  albuterol (PROVENTIL HFA;VENTOLIN HFA) 108 (90 Base) MCG/ACT inhaler Inhale 2 puffs into the lungs every 6 (six) hours as needed for wheezing or shortness of breath.   Yes Historical Provider, MD  amLODipine (NORVASC) 10 MG tablet Take 1 tablet (10 mg total) by mouth daily. 05/13/16  Yes Samuella Cota, MD  atorvastatin (LIPITOR) 20 MG tablet Take 1 tablet (20 mg total) by mouth daily at 6 PM. Patient taking differently: Take 20 mg by mouth daily.  06/03/16  Yes Maryann Mikhail, DO  cetirizine (ZYRTEC) 10 MG tablet Take 10 mg by mouth daily.   Yes Historical Provider, MD  hydrALAZINE (APRESOLINE) 100 MG tablet Take 1 tablet (100 mg total) by mouth 3 (three) times daily. 07/06/16  Yes Dayna N Dunn, PA-C  ipratropium (ATROVENT) 0.02 % nebulizer solution Take 2.5 mLs (0.5 mg total) by nebulization 3 (three) times daily. Patient taking differently: Take 0.5 mg by nebulization 3 (three) times daily. Mix with levalbuterol 06/17/16  Yes Estela Leonie Green, MD  levalbuterol Penne Lash) 0.63 MG/3ML nebulizer solution Take 3 mLs (0.63 mg total) by nebulization 3 (three) times daily. Patient taking differently: Take 0.63 mg by nebulization 3 (three) times daily. Mix with ipratropium. 06/17/16  Yes Estela Leonie Green, MD  metoprolol (LOPRESSOR) 50 MG tablet Take 1 tablet (50 mg total) by mouth 2 (two) times daily. 07/06/16  Yes Dayna N Dunn, PA-C  OXYGEN Inhale 2-3.5 L into the lungs continuous.   Yes Historical Provider, MD  pantoprazole (PROTONIX) 40 MG tablet Take 1 tablet (40 mg total) by mouth daily. 06/03/16  Yes Maryann  Mikhail, DO  predniSONE (DELTASONE) 10 MG tablet Take 1 tablet (10 mg total) by mouth daily with breakfast. Take 6 tablets today and then decrease by 1 tablet daily until none are left. Patient taking differently: Take 10 mg by mouth daily with breakfast.  06/17/16  Yes Estela Leonie Green, MD  torsemide (DEMADEX) 20 MG tablet Take 1 tablet (20 mg total) by mouth 2 (two) times daily. Patient taking differently: Take  40 mg by mouth daily.  07/07/16 10/05/16 Yes Dayna N Dunn, PA-C  Vitamin D, Ergocalciferol, (DRISDOL) 50000 units CAPS capsule Take 50,000 Units by mouth every Friday.  03/23/16  Yes Historical Provider, MD   Liver Function Tests  Recent Labs Lab 07/10/16 1957  AST 12*  ALT 11*  ALKPHOS 51  BILITOT 0.8  PROT 5.6*  ALBUMIN 3.2*   No results for input(s): LIPASE, AMYLASE in the last 168 hours. CBC  Recent Labs Lab 07/10/16 1957  07/15/16 0735 07/16/16 0224 07/17/16 0131  WBC 7.4  < > 8.7 10.7* 14.6*  NEUTROABS 5.8  --   --   --   --   HGB 9.3*  < > 9.9* 9.4* 9.2*  HCT 28.9*  < > 31.5* 31.3* 30.0*  MCV 88.4  < > 89.2 92.3 90.6  PLT 205  < > 151 152 146*  < > = values in this interval not displayed. Basic Metabolic Panel  Recent Labs Lab 07/13/16 0416  07/14/16 0534 07/14/16 1635 07/15/16 0448 07/15/16 1802 07/15/16 2109 07/16/16 0224 07/16/16 1133 07/16/16 1200 07/16/16 1906 07/17/16 0131  NA 142  < > 142  --  144  --  143 142 139 139 139 138  K 3.0*  < > 4.2  --  4.6  --  4.3 5.3* 4.3 4.3 4.1 3.7  CL 95*  < > 100*  --  98*  --  95* 96* 94* 94* 92* 90*  CO2 31  < > 30  --  33*  --  32 32 30 34* 32 35*  GLUCOSE 169*  < > 126*  --  122*  --  157* 131* 189* 188* 158* 126*  BUN 73*  < > 67*  --  69*  --  75* 81* 88* 89* 96* 97*  CREATININE 2.47*  < > 2.36*  --  2.16*  --  2.41* 2.31* 2.55* 2.55* 2.57* 2.74*  CALCIUM 8.1*  < > 8.5*  --  8.6*  --  9.0 9.1 9.0 9.1 8.9 8.7*  PHOS 4.1  --  3.8 3.9 3.9 3.2  --  2.9  --   --   --   --   < > = values in this  interval not displayed. Iron/TIBC/Ferritin/ %Sat No results found for: IRON, TIBC, FERRITIN, IRONPCTSAT  Vitals:   07/17/16 1008 07/17/16 1100 07/17/16 1200 07/17/16 1203  BP: (!) 114/101 116/85 123/75 123/75  Pulse: (!) 110 77 (!) 102 (!) 106  Resp: '15 16 16   '$ Temp:   97.7 F (36.5 C)   TempSrc:   Oral   SpO2: 96% 96% 96%   Weight:      Height:       Exam Gen elderly female, on vent, awake, minimal eye contact, withdrawn No rash, cyanosis or gangrene Sclera anicteric, throat w ETT  No jvd or bruits Chest clear ant/lat Cor irreg irreg, soft sem no RG Abd soft ntnd no mass or ascites +bs GU foley in place draining clear urine MS no joint effusions or deformity Ext 1-2+ UE > LE edema / no wounds or ulcers Neuro is alert, on vent   Assessment: 1. Acute/ CRF - in patient w long-standing HTN > 30 yrs, now w recurrent diast CHF/ pulm edema  refractory to diuresis.  Est GFR 18-20.  Refractory vol overload can be seen w RAS, and this patient has markedly asymmetric kidneys.  This may represent RAS which can cause refractory HTN and fluid overload  in pt's w CKD.  She is debilitated and would not be a good candidate for dialysis in this condition in my opinion.  Would like to pursue however possibility of RAS with IR, will ask them to evaluate, in case there is something potentially treatable which could improve her situation with regards to recurrent vol overload.  In meantime will switch back to bolus Lasix and see if she will respond any better to this.  And will also consult palliative to assist in assessing GOC for this patient.  Have d/w all of the above w family and questions answered.    Plan - as above  Kelly Splinter MD [facility] pager [phone]    cell [phone] 07/17/2016, 12:15 PM

## 2016-07-17 NOTE — Progress Notes (Addendum)
PULMONARY / CRITICAL CARE MEDICINE   Name: Sydney Jacobs MRN: [id number] DOB: February 10, 1934    ADMISSION DATE:  07/10/2016 CONSULTATION DATE:  9/4  REFERRING MD:  Sunnie Nielsen ([facility])   CHIEF COMPLAINT:  Respiratory failure   BRIEF: 82 y/o female with multiple medical problems on oxygen at home intubated on 9/7 in the setting of hypoxemic respiratory failure likely due to diastolic heart failure complicated by Afib and COPD.   SUBJECTIVE:  Some urine output but Cr/BUN climbing, overall net positive Remains on high vent support   VITAL SIGNS: BP 102/83   Pulse (!) 128   Temp 98.3 F (36.8 C) (Oral)   Resp 16   Ht 5' (1.524 m)   Wt 116 lb 13.5 oz (53 kg)   SpO2 97%   BMI 22.82 kg/m   HEMODYNAMICS:    VENTILATOR SETTINGS: Vent Mode: PRVC FiO2 (%):  [40 %-50 %] 40 % Set Rate:  [16 bmp] 16 bmp Vt Set:  [380 mL] 380 mL PEEP:  [10 cmH20] 10 cmH20 Plateau Pressure:  [16 cmH20-18 cmH20] 18 cmH20  INTAKE / OUTPUT: I/O last 3 completed shifts: In: 3193.9 [I.V.:1243.9; Other:90; NG/GT:1660; IV Piggyback:200] Out: 2075 [Urine:2075]  PHYSICAL EXAMINATION: General:  Chronically ill appearing on vent HENT: NCAT ETT in place PULM crackles bilaterally, vent supported breaths CV: Irreg irreg, tachycardic, less JVD today GI: BS+, soft, nontender MSK: normal bulk and tone Derm: some bruising Neuro: opens eyes to voice, maew  LABS:  BMET  Recent Labs Lab 07/16/16 1200 07/16/16 1906 07/17/16 0131  NA 139 139 138  K 4.3 4.1 3.7  CL 94* 92* 90*  CO2 34* 32 35*  BUN 89* 96* 97*  CREATININE 2.55* 2.57* 2.74*  GLUCOSE 188* 158* 126*   Electrolytes  Recent Labs Lab 07/15/16 0448 07/15/16 1802 07/15/16 2109 07/16/16 0224  07/16/16 1200 07/16/16 1906 07/17/16 0131  CALCIUM 8.6*  --  9.0 9.1  < > 9.1 8.9 8.7*  MG 1.9 1.8 1.9 2.3  --   --   --   --   PHOS 3.9 3.2  --  2.9  --   --   --   --   < > = values in this interval not displayed. CBC  Recent Labs Lab  07/15/16 0735 07/16/16 0224 07/17/16 0131  WBC 8.7 10.7* 14.6*  HGB 9.9* 9.4* 9.2*  HCT 31.5* 31.3* 30.0*  PLT 151 152 146*   Coag's  Recent Labs Lab 07/10/16 1957  INR 1.04   Sepsis Markers  Recent Labs Lab 07/10/16 2012 07/10/16 2254 07/10/16 2300 07/12/16 0458 07/14/16 0534  LATICACIDVEN 0.84  --  0.51  --   --   PROCALCITON  --  0.13  --  0.21 0.19   ABG  Recent Labs Lab 07/14/16 1302 07/15/16 0500 07/16/16 0500  PHART 7.463* 7.424 7.428  PCO2ART 54.6* 55.9* 52.4*  PO2ART 272.0* 73.3* 158*   Liver Enzymes  Recent Labs Lab 07/10/16 1957  AST 12*  ALT 11*  ALKPHOS 51  BILITOT 0.8  ALBUMIN 3.2*   Cardiac Enzymes  Recent Labs Lab 07/11/16 0250 07/11/16 0813 07/11/16 1447  TROPONINI 0.04* 0.04* 0.04*   Glucose  Recent Labs Lab 07/16/16 1153 07/16/16 1637 07/16/16 2031 07/17/16 0007 07/17/16 0442 07/17/16 0751  GLUCAP 196* 203* 141* 147* 188* 130*   Imaging Dg Chest Port 1 View  Result Date: 07/17/2016 CLINICAL DATA:  Acute respiratory failure with hypoxemia. EXAM: PORTABLE CHEST 1 VIEW COMPARISON:  07/16/2016 FINDINGS: 0513 hours.  Endotracheal tube tip is 2.8 cm above the base the carina. The NG tube passes into the stomach although the distal tip position is not included on the film. Bilateral interstitial and basilar airspace opacity persists. Small bilateral pleural effusions are again noted. Telemetry leads overlie the chest. IMPRESSION: No substantial change in exam. Electronically Signed   By: Kennith CenterEric  Mansell M.D.   On: 07/17/2016 07:19   STUDIES:  2D echo 8/7>>> EF 60-65%, grade 2 diastolic dysfunction, mod MR  CULTURES: 9/10 resp culture >   ANTIBIOTICS: None  SIGNIFICANT EVENTS: Intubated 9/7  LINES/TUBES: ETT 9/7>>>  DISCUSSION: 82yo female with acute on chronic respiratory failure r/t decompensated heart failure with underlying COPD.  ASSESSMENT / PLAN:  RENAL Acute on CKD IV  Hypokalemia  P:   Consult renal >  Dialysis may be the only option for recovery but this likely not a good option given her overall frail state and goals of care Continue lasix and metolazone today, will defer to renal adjusting these Monitor BMET and UOP Replace electrolytes as needed Hold albumin  PULMONARY Acute on chronic respiratory failure with hypoxemia due to decompensated diastolic heart failure with underlying COPD  Doubt secondary process likely pneumonia or inflammatory pneumonitis Bilateral Pleural effusion L>R COPD without acute exacerbation  Known pulmonary nodule  P:   Continue full vent support VAP prevention Continue diuresis Wean PEEP, FiO2 to maintain O2 saturation > 90% > wean PEEP to 5 cm H20 07/17/2016 May need thoracentesis  CARDIOVASCULAR Acute on chronic diastolic heart failure> exam 9/9 supports ongoing volume overload  AFib with RVR 9/6 HTN  P:  Cardiology following Lopressor and amiodarone per cardiology See renal   GASTROINTESTINAL No active issue  P:   Tube feedings per protocol pepcid  HEMATOLOGIC Chronic anemia History of retroperitoneal bleeding P:  Hold anticoagulation   INFECTIOUS No active issue  P:   Monitor WBC, fever curve off abx  Send resp culture today  ENDOCRINE Hyperglycemia - no known hx DM   P:   Monitor glucose on chem   NEUROLOGIC Sedation needs for vent synchrony P:   Versed PRN Fentanyl drip Supportive care  FAMILY  - Updates: Updated family on 9/9  Summary: overall not making much headway.  I doubt that we are dealing with an inflammatory or infectious pulmonary process, favor pulmonary edema given CXR findings and cardiac issues.  If family desires continued aggressive care she would likely need: right heart cath, bilateral thoracentesis vs chest tubes, hemodialysis.  After meeting with her family on 9/9, I don't think this is in the patient's best interest or in line with her goals of care.  Don't think empiric steroids likely to be  helpful.  May need to consider comfort measures.  Will ask for renal input 07/17/2016 regarding HD.  CC time 35 minutes   Heber CarolinaBrent Shyloh Krinke, MD [facility] Pager: [phone] Cell: [phone] After 3pm or if no response, call [phone]

## 2016-07-18 ENCOUNTER — Inpatient Hospital Stay (HOSPITAL_COMMUNITY): Payer: Commercial Managed Care - HMO

## 2016-07-18 DIAGNOSIS — J410 Simple chronic bronchitis: Secondary | ICD-10-CM

## 2016-07-18 DIAGNOSIS — N184 Chronic kidney disease, stage 4 (severe): Secondary | ICD-10-CM

## 2016-07-18 LAB — GLUCOSE, CAPILLARY
GLUCOSE-CAPILLARY: 143 mg/dL — AB (ref 65–99)
GLUCOSE-CAPILLARY: 150 mg/dL — AB (ref 65–99)
GLUCOSE-CAPILLARY: 180 mg/dL — AB (ref 65–99)
Glucose-Capillary: 126 mg/dL — ABNORMAL HIGH (ref 65–99)
Glucose-Capillary: 135 mg/dL — ABNORMAL HIGH (ref 65–99)
Glucose-Capillary: 157 mg/dL — ABNORMAL HIGH (ref 65–99)

## 2016-07-18 LAB — BASIC METABOLIC PANEL
Anion gap: 15 (ref 5–15)
BUN: 119 mg/dL — AB (ref 6–20)
CO2: 34 mmol/L — ABNORMAL HIGH (ref 22–32)
CREATININE: 2.77 mg/dL — AB (ref 0.44–1.00)
Calcium: 8.6 mg/dL — ABNORMAL LOW (ref 8.9–10.3)
Chloride: 85 mmol/L — ABNORMAL LOW (ref 101–111)
GFR calc Af Amer: 18 mL/min — ABNORMAL LOW (ref >60)
GFR, EST NON AFRICAN AMERICAN: 15 mL/min — AB (ref >60)
GLUCOSE: 123 mg/dL — AB (ref 65–99)
POTASSIUM: 3.2 mmol/L — AB (ref 3.5–5.1)
Sodium: 134 mmol/L — ABNORMAL LOW (ref 135–145)

## 2016-07-18 LAB — CBC
HCT: 30.3 % — ABNORMAL LOW (ref 36.0–46.0)
HEMATOCRIT: 30.5 % — AB (ref 36.0–46.0)
Hemoglobin: 9.5 g/dL — ABNORMAL LOW (ref 12.0–15.0)
Hemoglobin: 9.7 g/dL — ABNORMAL LOW (ref 12.0–15.0)
MCH: 27.8 pg (ref 26.0–34.0)
MCH: 27.8 pg (ref 26.0–34.0)
MCHC: 31.4 g/dL (ref 30.0–36.0)
MCHC: 31.8 g/dL (ref 30.0–36.0)
MCV: 88.6 fL (ref 78.0–100.0)
MCV: 87.4 fL (ref 78.0–100.0)
PLATELETS: 184 10*3/uL (ref 150–400)
Platelets: 185 10*3/uL (ref 150–400)
RBC: 3.42 MIL/uL — ABNORMAL LOW (ref 3.87–5.11)
RBC: 3.49 MIL/uL — ABNORMAL LOW (ref 3.87–5.11)
RDW: 15.5 % (ref 11.5–15.5)
RDW: 15.6 % — AB (ref 11.5–15.5)
WBC: 12.8 10*3/uL — ABNORMAL HIGH (ref 4.0–10.5)
WBC: 12.5 10*3/uL — ABNORMAL HIGH (ref 4.0–10.5)

## 2016-07-18 NOTE — Progress Notes (Signed)
Subjective:  Pt is intubated but alert and responsive  Objective:  Temp:  [97.3 F (36.3 C)-98.2 F (36.8 C)] 97.3 F (36.3 C) (09/11 0416) Pulse Rate:  [40-156] 86 (09/11 0800) Resp:  [14-23] 14 (09/11 0800) BP: (80-123)/(52-101) 87/52 (09/11 0829) SpO2:  [95 %-99 %] 95 % (09/11 0800) FiO2 (%):  [40 %] 40 % (09/11 0800) Weight:  [115 lb 4.8 oz (52.3 kg)] 115 lb 4.8 oz (52.3 kg) (09/11 0400) Weight change: -1 lb 8.7 oz (-0.7 kg)  Intake/Output from previous day: 09/10 0701 - 09/11 0700 In: 1837.1 [I.V.:796.1; NG/GT:975; IV Piggyback:66] Out: 1345 [Urine:1345]  Intake/Output from this shift: No intake/output data recorded.  Physical Exam: General appearance: alert, no distress and intubated Neck: alert, no distress and Intubated Lungs: clear to auscultation bilaterally Heart: irregularly irregular rhythm Extremities: extremities normal, atraumatic, no cyanosis or edema  Lab Results: Results for orders placed or performed during the hospital encounter of 07/10/16 (from the past 48 hour(s))  Basic metabolic panel     Status: Abnormal   Collection Time: 07/16/16 11:33 AM  Result Value Ref Range   Sodium 139 135 - 145 mmol/L   Potassium 4.3 3.5 - 5.1 mmol/L   Chloride 94 (L) 101 - 111 mmol/L   CO2 30 22 - 32 mmol/L   Glucose, Bld 189 (H) 65 - 99 mg/dL   BUN 88 (H) 6 - 20 mg/dL   Creatinine, Ser 2.55 (H) 0.44 - 1.00 mg/dL   Calcium 9.0 8.9 - 10.3 mg/dL   GFR calc non Af Amer 17 (L) >60 mL/min   GFR calc Af Amer 19 (L) >60 mL/min    Comment: (NOTE) The eGFR has been calculated using the CKD EPI equation. This calculation has not been validated in all clinical situations. eGFR's persistently <60 mL/min signify possible Chronic Kidney Disease.    Anion gap 15 5 - 15  Glucose, capillary     Status: Abnormal   Collection Time: 07/16/16 11:53 AM  Result Value Ref Range   Glucose-Capillary 196 (H) 65 - 99 mg/dL  Basic metabolic panel     Status: Abnormal   Collection Time: 07/16/16 12:00 PM  Result Value Ref Range   Sodium 139 135 - 145 mmol/L   Potassium 4.3 3.5 - 5.1 mmol/L   Chloride 94 (L) 101 - 111 mmol/L   CO2 34 (H) 22 - 32 mmol/L   Glucose, Bld 188 (H) 65 - 99 mg/dL   BUN 89 (H) 6 - 20 mg/dL   Creatinine, Ser 2.55 (H) 0.44 - 1.00 mg/dL   Calcium 9.1 8.9 - 10.3 mg/dL   GFR calc non Af Amer 17 (L) >60 mL/min   GFR calc Af Amer 19 (L) >60 mL/min    Comment: (NOTE) The eGFR has been calculated using the CKD EPI equation. This calculation has not been validated in all clinical situations. eGFR's persistently <60 mL/min signify possible Chronic Kidney Disease.    Anion gap 11 5 - 15  Glucose, capillary     Status: Abnormal   Collection Time: 07/16/16  4:37 PM  Result Value Ref Range   Glucose-Capillary 203 (H) 65 - 99 mg/dL   Comment 1 Venous Specimen   Basic metabolic panel     Status: Abnormal   Collection Time: 07/16/16  7:06 PM  Result Value Ref Range   Sodium 139 135 - 145 mmol/L   Potassium 4.1 3.5 - 5.1 mmol/L   Chloride 92 (L) 101 - 111 mmol/L  CO2 32 22 - 32 mmol/L   Glucose, Bld 158 (H) 65 - 99 mg/dL   BUN 96 (H) 6 - 20 mg/dL   Creatinine, Ser 2.57 (H) 0.44 - 1.00 mg/dL   Calcium 8.9 8.9 - 10.3 mg/dL   GFR calc non Af Amer 17 (L) >60 mL/min   GFR calc Af Amer 19 (L) >60 mL/min    Comment: (NOTE) The eGFR has been calculated using the CKD EPI equation. This calculation has not been validated in all clinical situations. eGFR's persistently <60 mL/min signify possible Chronic Kidney Disease.    Anion gap 15 5 - 15  Glucose, capillary     Status: Abnormal   Collection Time: 07/16/16  8:31 PM  Result Value Ref Range   Glucose-Capillary 141 (H) 65 - 99 mg/dL   Comment 1 Capillary Specimen   Glucose, capillary     Status: Abnormal   Collection Time: 07/17/16 12:07 AM  Result Value Ref Range   Glucose-Capillary 147 (H) 65 - 99 mg/dL   Comment 1 Capillary Specimen   Basic metabolic panel     Status: Abnormal    Collection Time: 07/17/16  1:31 AM  Result Value Ref Range   Sodium 138 135 - 145 mmol/L   Potassium 3.7 3.5 - 5.1 mmol/L   Chloride 90 (L) 101 - 111 mmol/L   CO2 35 (H) 22 - 32 mmol/L   Glucose, Bld 126 (H) 65 - 99 mg/dL   BUN 97 (H) 6 - 20 mg/dL   Creatinine, Ser 2.74 (H) 0.44 - 1.00 mg/dL   Calcium 8.7 (L) 8.9 - 10.3 mg/dL   GFR calc non Af Amer 15 (L) >60 mL/min   GFR calc Af Amer 18 (L) >60 mL/min    Comment: (NOTE) The eGFR has been calculated using the CKD EPI equation. This calculation has not been validated in all clinical situations. eGFR's persistently <60 mL/min signify possible Chronic Kidney Disease.    Anion gap 13 5 - 15  CBC     Status: Abnormal   Collection Time: 07/17/16  1:31 AM  Result Value Ref Range   WBC 14.6 (H) 4.0 - 10.5 K/uL   RBC 3.31 (L) 3.87 - 5.11 MIL/uL   Hemoglobin 9.2 (L) 12.0 - 15.0 g/dL   HCT 30.0 (L) 36.0 - 46.0 %   MCV 90.6 78.0 - 100.0 fL   MCH 27.8 26.0 - 34.0 pg   MCHC 30.7 30.0 - 36.0 g/dL   RDW 15.8 (H) 11.5 - 15.5 %   Platelets 146 (L) 150 - 400 K/uL  Glucose, capillary     Status: Abnormal   Collection Time: 07/17/16  4:42 AM  Result Value Ref Range   Glucose-Capillary 188 (H) 65 - 99 mg/dL   Comment 1 Capillary Specimen   Glucose, capillary     Status: Abnormal   Collection Time: 07/17/16  7:51 AM  Result Value Ref Range   Glucose-Capillary 130 (H) 65 - 99 mg/dL  Culture, respiratory (NON-Expectorated)     Status: None (Preliminary result)   Collection Time: 07/17/16  8:30 AM  Result Value Ref Range   Specimen Description TRACHEAL ASPIRATE    Special Requests NONE    Gram Stain      MODERATE WBC PRESENT, PREDOMINANTLY PMN RARE SQUAMOUS EPITHELIAL CELLS PRESENT MODERATE GRAM POSITIVE COCCI IN PAIRS FEW GRAM VARIABLE ROD    Culture PENDING    Report Status PENDING   Glucose, capillary     Status: Abnormal  Collection Time: 07/17/16 11:52 AM  Result Value Ref Range   Glucose-Capillary 164 (H) 65 - 99 mg/dL  Glucose,  capillary     Status: Abnormal   Collection Time: 07/17/16  4:04 PM  Result Value Ref Range   Glucose-Capillary 156 (H) 65 - 99 mg/dL  Glucose, capillary     Status: Abnormal   Collection Time: 07/17/16  8:45 PM  Result Value Ref Range   Glucose-Capillary 149 (H) 65 - 99 mg/dL  Glucose, capillary     Status: Abnormal   Collection Time: 07/18/16 12:53 AM  Result Value Ref Range   Glucose-Capillary 135 (H) 65 - 99 mg/dL  CBC     Status: Abnormal   Collection Time: 07/18/16  2:33 AM  Result Value Ref Range   WBC 12.8 (H) 4.0 - 10.5 K/uL   RBC 3.42 (L) 3.87 - 5.11 MIL/uL   Hemoglobin 9.5 (L) 12.0 - 15.0 g/dL   HCT 30.3 (L) 36.0 - 46.0 %   MCV 88.6 78.0 - 100.0 fL   MCH 27.8 26.0 - 34.0 pg   MCHC 31.4 30.0 - 36.0 g/dL   RDW 15.5 11.5 - 15.5 %   Platelets 184 150 - 400 K/uL  Basic metabolic panel     Status: Abnormal   Collection Time: 07/18/16  2:33 AM  Result Value Ref Range   Sodium 134 (L) 135 - 145 mmol/L   Potassium 3.2 (L) 3.5 - 5.1 mmol/L   Chloride 85 (L) 101 - 111 mmol/L   CO2 34 (H) 22 - 32 mmol/L   Glucose, Bld 123 (H) 65 - 99 mg/dL   BUN 119 (H) 6 - 20 mg/dL   Creatinine, Ser 2.77 (H) 0.44 - 1.00 mg/dL   Calcium 8.6 (L) 8.9 - 10.3 mg/dL   GFR calc non Af Amer 15 (L) >60 mL/min   GFR calc Af Amer 18 (L) >60 mL/min    Comment: (NOTE) The eGFR has been calculated using the CKD EPI equation. This calculation has not been validated in all clinical situations. eGFR's persistently <60 mL/min signify possible Chronic Kidney Disease.    Anion gap 15 5 - 15  Glucose, capillary     Status: Abnormal   Collection Time: 07/18/16  4:27 AM  Result Value Ref Range   Glucose-Capillary 143 (H) 65 - 99 mg/dL   Comment 1 Capillary Specimen   Glucose, capillary     Status: Abnormal   Collection Time: 07/18/16  7:42 AM  Result Value Ref Range   Glucose-Capillary 126 (H) 65 - 99 mg/dL   Comment 1 Capillary Specimen     Imaging: Imaging results have been reviewed bilat  interstitial edema  Tele- Afib with VR low 100s  Assessment/Plan:   1. Principal Problem: 2.   Acute on chronic diastolic congestive heart failure ([place]) 3. Active Problems: 4.   Elevated troponin 5.   Chronic kidney disease (CKD), stage IV (severe) ([place]) 6.   Essential hypertension 7.   COPD (chronic obstructive pulmonary disease) ([place]) 8.   Retroperitoneal hematoma in 8/17 9.   PAF (paroxysmal atrial fibrillation) ([place]) 10.   AAA (abdominal aortic aneurysm) ([place]) 11.   Chronic respiratory failure ([place]) 12.   Normocytic anemia 13.   Hypokalemia 14.   Atrial fibrillation with RVR ([employer]) 15.   Acute respiratory failure with hypoxemia ([facility]) 16.   Acute pulmonary edema ([facility]) 17.   Time Spent Directly with Patient:  20 minutes  Length of Stay:  LOS: 8 days  Pt admitted 9/3 with resp failure. Intubated with VDRF. She has diastolic HF with nl EF and cors by cath 05/26/16. In addition she has Afib with RVR, rates controlled with IV amio , hypotension with BP 90/50 (hypertensive in the past) and worsening CRI (SCr 2 months ago was mid 1 range, now 2.7). She is on high dose IV lasix with minimal UOP. BP prob too low for HD (? Ultrafiltration) but Renal service asked to see. Not an ideal anticoag candidate secondary to recent [facility] as well as co morbities but embolic risk high. Options are limited. Will be difficult to extubate with persistent interstitial edema and poor UOP , rising SCr.   Quay Burow 07/18/2016, 8:42 AM

## 2016-07-18 NOTE — Progress Notes (Signed)
[facility] Progress Note   Assessment/ Plan:   1. AKI on CKD Stage IV: Suspected to be hemodynamically mediated with effects from diastolic heart failure/RAS activation with ongoing diuretic therapy. Urine output remains unimpressive as she remains fluid positive on recorded input/output. Asymmetric kidneys noted on renal ultrasound-left kidney 8 cm with increased echogenicity and right kidney profoundly larger at 16 cm---this raises some suspicion for renovascular disease/renal artery stenosis that might be contributing to her presentation with pulmonary edema and may need further radiological evaluation/intervention. Unlikely that it will contribute any further to improvement of renal function however would reduce her hospitalizations from pulmonary edema. Unknown baseline functional status (to me) but appears to be fairly deconditioned---at this juncture, would benefit from temporary dialysis/CRRT but probably not as much from long-term dialysis. Will discuss further with CCM. 2. Acute on chronic respiratory failure: From decompensated heart failure with underlying COPD and bilateral pleural effusions. Ongoing ventilator support with efforts at diuresis. 3. Acute exacerbation of chronic diastolic heart failure: Ongoing efforts at diuretic therapy for volume unloading however with limited success so far-on furosemide 160 mg IV 3 times a day and previously Lasix drip/metolazone. 4. Hypokalemia: Likely from ongoing diuretic use/wasting with limited intake-replaced via enteral route 5. Hyponatremia: Secondary to CHF exacerbation with acute on chronic renal failure and impaired free water handling.  Subjective:   No acute events noted overnight    Objective:   BP 106/74   Pulse (!) 50   Temp 97.3 F (36.3 C) (Oral)   Resp 11   Ht 5' (1.524 m)   Wt 52.3 kg (115 lb 4.8 oz)   SpO2 94%   BMI 22.52 kg/m   Intake/Output Summary (Last 24 hours) at 07/18/16 1017 Last data filed at  07/18/16 0800  Gross per 24 hour  Intake          1804.85 ml  Output             1345 ml  Net           459.85 ml   Weight change: -0.7 kg (-1 lb 8.7 oz)  Physical Exam: [id number]:Intubated, awake and nods to questions CVS: Pulse currently irregular tachycardia (105), no murmurs/Gallop Resp: Clear to auscultation bilaterally-no rales Abd: Soft, flat, nontender Ext: No lower extremity edema noted  Imaging: Dg Chest Port 1 View  Result Date: 07/18/2016 CLINICAL DATA:  Acute respiratory failure with hypoxia EXAM: PORTABLE CHEST 1 VIEW COMPARISON:  Portable chest x-ray of July 17, 2016 FINDINGS: The lungs are adequately inflated. The interstitial markings remain increased. Density over the right pulmonary apex is more conspicuous but is in part due to overlying support apparatus. There are bilateral pleural effusions greater on the right than on the left. These appear stable. The endotracheal tube tip projects approximately 3.3 cm above the carina. The esophagogastric tube tip projects below the inferior margin of the image. The heart is normal in size. The pulmonary vascularity is indistinct. There is calcification in the wall of the aortic arch. There is moderate dextrocurvature centered in the lower thoracic spine. IMPRESSION: Persistent bilateral pleural effusions and bibasilar atelectasis or infiltrate. Bilateral interstitial edema or infiltrate is slightly less conspicuous. The pulmonary vascularity is less engorged as well. The support tubes are in reasonable position. Electronically Signed   By: David  Swaziland[place] M.D.   On: 07/18/2016 07:17   Dg Chest Port 1 View  Result Date: 07/17/2016 CLINICAL DATA:  Acute respiratory failure with hypoxemia. EXAM: PORTABLE CHEST 1 VIEW COMPARISON:  07/16/2016 FINDINGS: 0513 hours. Endotracheal tube tip is 2.8 cm above the base the carina. The NG tube passes into the stomach although the distal tip position is not included on the film. Bilateral interstitial  and basilar airspace opacity persists. Small bilateral pleural effusions are again noted. Telemetry leads overlie the chest. IMPRESSION: No substantial change in exam. Electronically Signed   By: Kennith Center M.D.   On: 07/17/2016 07:19    Labs: BMET  Recent Labs Lab 07/13/16 0416  07/14/16 0534 07/14/16 1635 07/15/16 0448 07/15/16 1802 07/15/16 2109 07/16/16 0224 07/16/16 1133 07/16/16 1200 07/16/16 1906 07/17/16 0131 07/18/16 0233  NA 142  < > 142  --  144  --  143 142 139 139 139 138 134*  K 3.0*  < > 4.2  --  4.6  --  4.3 5.3* 4.3 4.3 4.1 3.7 3.2*  CL 95*  < > 100*  --  98*  --  95* 96* 94* 94* 92* 90* 85*  CO2 31  < > 30  --  33*  --  32 32 30 34* 32 35* 34*  GLUCOSE 169*  < > 126*  --  122*  --  157* 131* 189* 188* 158* 126* 123*  BUN 73*  < > 67*  --  69*  --  75* 81* 88* 89* 96* 97* 119*  CREATININE 2.47*  < > 2.36*  --  2.16*  --  2.41* 2.31* 2.55* 2.55* 2.57* 2.74* 2.77*  CALCIUM 8.1*  < > 8.5*  --  8.6*  --  9.0 9.1 9.0 9.1 8.9 8.7* 8.6*  PHOS 4.1  --  3.8 3.9 3.9 3.2  --  2.9  --   --   --   --   --   < > = values in this interval not displayed. CBC  Recent Labs Lab 07/16/16 0224 07/17/16 0131 07/18/16 0233 07/18/16 0816  WBC 10.7* 14.6* 12.8* 12.5*  HGB 9.4* 9.2* 9.5* 9.7*  HCT 31.3* 30.0* 30.3* 30.5*  MCV 92.3 90.6 88.6 87.4  PLT 152 146* 184 185    Medications:    . atorvastatin  20 mg Oral q1800  . chlorhexidine  15 mL Mouth Rinse BID  . docusate  100 mg Oral BID  . famotidine  20 mg Oral QHS  . feeding supplement (PRO-STAT SUGAR FREE 64)  30 mL Per Tube Daily  . furosemide  160 mg Intravenous Q8H  . insulin aspart  0-9 Units Subcutaneous Q4H  . ipratropium  0.5 mg Nebulization TID  . levalbuterol  1.25 mg Nebulization TID  . mouth rinse  15 mL Mouth Rinse 10 times per day  . metoprolol tartrate  25 mg Per Tube Q4H  . rocuronium  5 mg/kg Intravenous Once  . sodium chloride flush  3 mL Intravenous Q12H   Zetta Bills, MD 07/18/2016, 10:17 AM

## 2016-07-18 NOTE — Progress Notes (Signed)
PULMONARY / CRITICAL CARE MEDICINE   Name: Sydney Jacobs MRN: [id number] DOB: August 04, 1934    ADMISSION DATE:  07/10/2016 CONSULTATION DATE:  9/4  REFERRING MD:  Sunnie Nielsenegalado ([facility])   CHIEF COMPLAINT:  Respiratory failure   BRIEF: 82 y/o female with multiple medical problems on oxygen at home intubated on 9/7 in the setting of hypoxemic respiratory failure likely due to diastolic heart failure complicated by Afib and COPD.  SUBJECTIVE:  No events overnight, remains fluid even.  VITAL SIGNS: BP (!) 86/63   Pulse 70   Temp 97.3 F (36.3 C) (Oral)   Resp (!) 23   Ht 5' (1.524 m)   Wt 52.3 kg (115 lb 4.8 oz)   SpO2 99%   BMI 22.52 kg/m   HEMODYNAMICS:    VENTILATOR SETTINGS: Vent Mode: PRVC FiO2 (%):  [40 %] 40 % Set Rate:  [16 bmp] 16 bmp Vt Set:  [380 mL] 380 mL PEEP:  [5 cmH20] 5 cmH20 Plateau Pressure:  [15 cmH20-17 cmH20] 17 cmH20  INTAKE / OUTPUT: I/O last 3 completed shifts: In: 3255.3 [I.V.:1359.3; Other:90; NG/GT:1740; IV Piggyback:66] Out: 2425 [Urine:2425]  PHYSICAL EXAMINATION: General:  Chronically ill appearing on vent, arousable. HENT: NCAT ETT in place PULM crackles bilaterally, vent supported breaths CV: Irreg irreg, tachycardic, less JVD today GI: BS+, soft, nontender MSK: normal bulk and tone Derm: some bruising Neuro: opens eyes to voice, moving all ext to command  LABS:  BMET  Recent Labs Lab 07/16/16 1906 07/17/16 0131 07/18/16 0233  NA 139 138 134*  K 4.1 3.7 3.2*  CL 92* 90* 85*  CO2 32 35* 34*  BUN 96* 97* 119*  CREATININE 2.57* 2.74* 2.77*  GLUCOSE 158* 126* 123*   Electrolytes  Recent Labs Lab 07/15/16 0448 07/15/16 1802 07/15/16 2109 07/16/16 0224  07/16/16 1906 07/17/16 0131 07/18/16 0233  CALCIUM 8.6*  --  9.0 9.1  < > 8.9 8.7* 8.6*  MG 1.9 1.8 1.9 2.3  --   --   --   --   PHOS 3.9 3.2  --  2.9  --   --   --   --   < > = values in this interval not displayed. CBC  Recent Labs Lab 07/16/16 0224  07/17/16 0131 07/18/16 0233  WBC 10.7* 14.6* 12.8*  HGB 9.4* 9.2* 9.5*  HCT 31.3* 30.0* 30.3*  PLT 152 146* 184   Coag's No results for input(s): APTT, INR in the last 168 hours. Sepsis Markers  Recent Labs Lab 07/12/16 0458 07/14/16 0534  PROCALCITON 0.21 0.19   ABG  Recent Labs Lab 07/14/16 1302 07/15/16 0500 07/16/16 0500  PHART 7.463* 7.424 7.428  PCO2ART 54.6* 55.9* 52.4*  PO2ART 272.0* 73.3* 158*   Liver Enzymes No results for input(s): AST, ALT, ALKPHOS, BILITOT, ALBUMIN in the last 168 hours. Cardiac Enzymes  Recent Labs Lab 07/11/16 0813 07/11/16 1447  TROPONINI 0.04* 0.04*   Glucose  Recent Labs Lab 07/17/16 0751 07/17/16 1152 07/17/16 1604 07/17/16 2045 07/18/16 0053 07/18/16 0427  GLUCAP 130* 164* 156* 149* 135* 143*   Imaging No results found. STUDIES:  2D echo 8/7>>> EF 60-65%, grade 2 diastolic dysfunction, mod MR  CULTURES: 9/10 resp culture >   ANTIBIOTICS: None  SIGNIFICANT EVENTS: Intubated 9/7  LINES/TUBES: ETT 9/7>>>  DISCUSSION: 82yo female with acute on chronic respiratory failure r/t decompensated heart failure with underlying COPD.  ASSESSMENT / PLAN:  RENAL Acute on CKD IV  Hypokalemia  P:   Consult renal >  Not a dialysis candidate but considering renal artery stenosis as a possibility, renal called IR to evaluate. Change lasix to bolus per renal recommendations. Monitor BMET and UOP Replace electrolytes as needed Hold albumin  PULMONARY Acute on chronic respiratory failure with hypoxemia due to decompensated diastolic heart failure with underlying COPD  Doubt secondary process likely pneumonia or inflammatory pneumonitis Bilateral Pleural effusion L>R COPD without acute exacerbation  Known pulmonary nodule  P:   Continue full vent support VAP prevention Continue diuresis Begin PS trials but no extubation until fluid status is improved. May need thoracentesis but will hold off for now until renal  status has been addressed.  CARDIOVASCULAR Acute on chronic diastolic heart failure> exam 9/9 supports ongoing volume overload  AFib with RVR 9/6 HTN  P:  Cardiology following Lopressor and amiodarone per cardiology See renal  GASTROINTESTINAL No active issue  P:   Tube feedings per protocol Pepcid  HEMATOLOGIC Chronic anemia History of retroperitoneal bleeding P:  Hold anticoagulation   INFECTIOUS No active issue  P:   Monitor WBC, fever curve off abx  Send resp culture today  ENDOCRINE Hyperglycemia - no known hx DM   P:   Monitor glucose on chem   NEUROLOGIC Sedation needs for vent synchrony P:   Versed PRN Fentanyl drip Supportive care  FAMILY  - Updates: No family bedside 9/11.  Summary: Overall not making much headway.  Renal does not feel patient is a good dialysis candidate.  Will pursue renal artery stenosis as above and consult with palliative care.  I do not think further aggressive care in this situation is in the patient's best interest.  Will continue support for now until able to meet with family.    The patient is critically ill with multiple organ systems failure and requires high complexity decision making for assessment and support, frequent evaluation and titration of therapies, application of advanced monitoring technologies and extensive interpretation of multiple databases.   Critical Care Time devoted to patient care services described in this note is  35  Minutes. This time reflects time of care of this signee Dr Koren Bound. This critical care time does not reflect procedure time, or teaching time or supervisory time of PA/NP/Med student/Med Resident etc but could involve care discussion time.  [name], M.D. [facility] Pulmonary/Critical Care Medicine. Pager: [phone]. After hours pager: [phone].

## 2016-07-19 ENCOUNTER — Encounter (HOSPITAL_COMMUNITY): Payer: Commercial Managed Care - HMO

## 2016-07-19 ENCOUNTER — Inpatient Hospital Stay (HOSPITAL_COMMUNITY): Payer: Commercial Managed Care - HMO

## 2016-07-19 DIAGNOSIS — N179 Acute kidney failure, unspecified: Secondary | ICD-10-CM

## 2016-07-19 LAB — GLUCOSE, CAPILLARY
GLUCOSE-CAPILLARY: 126 mg/dL — AB (ref 65–99)
GLUCOSE-CAPILLARY: 200 mg/dL — AB (ref 65–99)
Glucose-Capillary: 142 mg/dL — ABNORMAL HIGH (ref 65–99)
Glucose-Capillary: 149 mg/dL — ABNORMAL HIGH (ref 65–99)
Glucose-Capillary: 172 mg/dL — ABNORMAL HIGH (ref 65–99)
Glucose-Capillary: 110 mg/dL — ABNORMAL HIGH (ref 65–99)

## 2016-07-19 LAB — BLOOD GAS, ARTERIAL
ACID-BASE EXCESS: 11.3 mmol/L — AB (ref 0.0–2.0)
BICARBONATE: 35.7 mmol/L — AB (ref 20.0–28.0)
DRAWN BY: 44956
FIO2: 40
LHR: 16 {breaths}/min
MECHVT: 380 mL
O2 SAT: 95.3 %
PEEP/CPAP: 5 cmH2O
Patient temperature: 98.6
pCO2 arterial: 51 mmHg — ABNORMAL HIGH (ref 32.0–48.0)
pH, Arterial: 7.459 — ABNORMAL HIGH (ref 7.350–7.450)
pO2, Arterial: 74.4 mmHg — ABNORMAL LOW (ref 83.0–108.0)

## 2016-07-19 LAB — RENAL FUNCTION PANEL
ALBUMIN: 3 g/dL — AB (ref 3.5–5.0)
Anion gap: 13 (ref 5–15)
BUN: 104 mg/dL — AB (ref 6–20)
CO2: 32 mmol/L (ref 22–32)
CREATININE: 2.19 mg/dL — AB (ref 0.44–1.00)
Calcium: 8.6 mg/dL — ABNORMAL LOW (ref 8.9–10.3)
Chloride: 90 mmol/L — ABNORMAL LOW (ref 101–111)
GFR calc Af Amer: 23 mL/min — ABNORMAL LOW (ref >60)
GFR, EST NON AFRICAN AMERICAN: 20 mL/min — AB (ref >60)
GLUCOSE: 178 mg/dL — AB (ref 65–99)
PHOSPHORUS: 3.6 mg/dL (ref 2.5–4.6)
POTASSIUM: 4 mmol/L (ref 3.5–5.1)
Sodium: 135 mmol/L (ref 135–145)

## 2016-07-19 LAB — BASIC METABOLIC PANEL
Anion gap: 16 — ABNORMAL HIGH (ref 5–15)
BUN: 133 mg/dL — AB (ref 6–20)
CHLORIDE: 82 mmol/L — AB (ref 101–111)
CO2: 35 mmol/L — AB (ref 22–32)
CREATININE: 2.95 mg/dL — AB (ref 0.44–1.00)
Calcium: 8.8 mg/dL — ABNORMAL LOW (ref 8.9–10.3)
GFR calc Af Amer: 16 mL/min — ABNORMAL LOW (ref >60)
GFR calc non Af Amer: 14 mL/min — ABNORMAL LOW (ref >60)
Glucose, Bld: 118 mg/dL — ABNORMAL HIGH (ref 65–99)
POTASSIUM: 2.9 mmol/L — AB (ref 3.5–5.1)
Sodium: 133 mmol/L — ABNORMAL LOW (ref 135–145)

## 2016-07-19 LAB — CBC
HEMATOCRIT: 29.7 % — AB (ref 36.0–46.0)
HEMOGLOBIN: 9.5 g/dL — AB (ref 12.0–15.0)
MCH: 27.9 pg (ref 26.0–34.0)
MCHC: 32 g/dL (ref 30.0–36.0)
MCV: 87.1 fL (ref 78.0–100.0)
Platelets: 199 10*3/uL (ref 150–400)
RBC: 3.41 MIL/uL — AB (ref 3.87–5.11)
RDW: 15.3 % (ref 11.5–15.5)
WBC: 12.4 10*3/uL — ABNORMAL HIGH (ref 4.0–10.5)

## 2016-07-19 LAB — PHOSPHORUS: PHOSPHORUS: 4.5 mg/dL (ref 2.5–4.6)

## 2016-07-19 LAB — CULTURE, RESPIRATORY W GRAM STAIN

## 2016-07-19 LAB — MAGNESIUM: MAGNESIUM: 2 mg/dL (ref 1.7–2.4)

## 2016-07-19 LAB — CULTURE, RESPIRATORY: CULTURE: NORMAL

## 2016-07-19 MED ORDER — PRISMASOL BGK 4/2.5 32-4-2.5 MEQ/L IV SOLN
INTRAVENOUS | Status: DC
  Administered 2016-07-19 – 2016-07-20 (×3): via INTRAVENOUS_CENTRAL
  Filled 2016-07-19 (×6): qty 5000

## 2016-07-19 MED ORDER — POTASSIUM CHLORIDE 20 MEQ/15ML (10%) PO SOLN
40.0000 meq | Freq: Once | ORAL | Status: AC
  Administered 2016-07-19: 40 meq
  Filled 2016-07-19: qty 30

## 2016-07-19 MED ORDER — VITAL HIGH PROTEIN PO LIQD
1000.0000 mL | ORAL | Status: DC
  Administered 2016-07-19 – 2016-07-20 (×2): 1000 mL

## 2016-07-19 MED ORDER — HEPARIN (PORCINE) 2000 UNITS/L FOR CRRT
INTRAVENOUS_CENTRAL | Status: DC | PRN
  Filled 2016-07-19: qty 1000

## 2016-07-19 MED ORDER — DEXTROSE 5 % IV SOLN
0.0000 ug/min | INTRAVENOUS | Status: DC
  Administered 2016-07-19: 10 ug/min via INTRAVENOUS
  Filled 2016-07-19 (×2): qty 16

## 2016-07-19 MED ORDER — PRISMASOL BGK 4/2.5 32-4-2.5 MEQ/L IV SOLN
INTRAVENOUS | Status: DC
  Administered 2016-07-19 – 2016-07-20 (×9): via INTRAVENOUS_CENTRAL
  Filled 2016-07-19 (×22): qty 5000

## 2016-07-19 MED ORDER — HEPARIN SODIUM (PORCINE) 1000 UNIT/ML DIALYSIS
1000.0000 [IU] | INTRAMUSCULAR | Status: DC | PRN
Start: 2016-07-19 — End: 2016-07-22
  Administered 2016-07-19: 2400 [IU] via INTRAVENOUS_CENTRAL
  Administered 2016-07-20: 1200 [IU] via INTRAVENOUS_CENTRAL
  Administered 2016-07-20 – 2016-07-21 (×2): 2400 [IU] via INTRAVENOUS_CENTRAL
  Filled 2016-07-19: qty 4
  Filled 2016-07-19 (×3): qty 6
  Filled 2016-07-19: qty 3
  Filled 2016-07-19: qty 6
  Filled 2016-07-19: qty 3
  Filled 2016-07-19: qty 2
  Filled 2016-07-19: qty 6

## 2016-07-19 MED ORDER — PRISMASOL BGK 4/2.5 32-4-2.5 MEQ/L IV SOLN
INTRAVENOUS | Status: DC
  Administered 2016-07-19 – 2016-07-20 (×3): via INTRAVENOUS_CENTRAL
  Filled 2016-07-19 (×9): qty 5000

## 2016-07-19 NOTE — Progress Notes (Signed)
Nutrition Follow-up  DOCUMENTATION CODES:   Severe malnutrition in context of acute illness/injury  INTERVENTION:   D/C Vital AF 1.2  Vital High Protein @ 45 ml/hr Provides: 1080 ml, 1080 kcal, 94 grams protein, and 902 ml H2O.   NUTRITION DIAGNOSIS:   Malnutrition (Severe) related to acute illness (CHF, COPD) as evidenced by severe depletion of muscle mass, moderate depletion of body fat, percent weight loss. Ongoing.   GOAL:   Patient will meet greater than or equal to 90% of their needs Met.   MONITOR:   TF tolerance, I & O's, Labs  ASSESSMENT:   82yo female with multiple medical problems including chronic respiratory failure on the basis of dCHF and COPD on home O2, CKD III,  pulmonary nodule, PAF, recent cath without significant CAD c/b retroperitoneal bleed post procedure.  Presented 9/3 with worsening SOB.  She was admitted by [facility] to SDU on bipap with acute on chronic respiratory failure r/t acute on chronic CHF +/- COPD.  On 9/4 she remained extremely SOB, failed brief trial off bipap   9/12 start CRRT short term, per MD pt is not a candidate for long term HD, MD discussing plan of care with family.   Patient is currently intubated on ventilator support MV: 7.9 L/min Temp (24hrs), Avg:98 F (36.7 C), Min:97.4 F (36.3 C), Max:98.8 F (37.1 C)  Medications reviewed and include: colace, noepinephrine Labs reviewed: Na 133, K+ 2.9, PO4 WNL CBG's: 149-200 OG tube  Diet Order:  Diet NPO time specified  Skin:  Reviewed, no issues  Last BM:  9/9  Height:   Ht Readings from Last 1 Encounters:  07/15/16 5' (1.524 m)    Weight:   Wt Readings from Last 1 Encounters:  07/19/16 113 lb 5.1 oz (51.4 kg)    Ideal Body Weight:  45.4 kg  BMI:  Body mass index is 22.13 kg/m.  Estimated Nutritional Needs:   Kcal:  1108  Protein:  80-100 grams  Fluid:  > 1.5 L/day  EDUCATION NEEDS:   No education needs identified at this time  [place],  [place], [place] Pager [phone] After Hours Pager

## 2016-07-19 NOTE — Progress Notes (Signed)
[facility] Progress Note   Assessment/ Plan:   1. AKI on CKD Stage IV: Suspected to be hemodynamically mediated with effects from diastolic heart failure/RAS activation with ongoing diuretic therapy. Continues to have marginal urine output in response to high-dose furosemide with continued net fluid positive balance. May benefit from time-limited CRRT for ultrafiltration as well as clearance at this point given her elevated BUN which likely might be clouding her mentation/aggravating accumulation of effusions. Will have multidisciplinary discussion with the family along with CCM to establish a) the risks and benefits of dialysis and the limited benefit this will have on her overall mortality b) time-limits to the trial of CRRT c) expectation that this will be acute and not chronic hemodialysis as she is unlikely to be able to tolerate the latter given her recent deconditioning and poor functional status. 2. Acute on chronic respiratory failure: From decompensated heart failure with underlying COPD and bilateral pleural effusions. Ongoing ventilator support with efforts at diuresis. 3. Acute exacerbation of chronic diastolic heart failure: Ongoing efforts at diuretic therapy for volume unloading however with limited success so far-on furosemide 160 mg IV 3 times a day and likely to consider CRRT to beginning today after discussions are held along with CCM with the family. 4. Hypokalemia: Likely from ongoing diuretic use/wasting with limited intake-replaced via enteral route 5. Hyponatremia: Secondary to CHF exacerbation with acute on chronic renal failure and impaired free water handling.  Subjective:   No acute events noted overnight    Objective:   BP 109/83   Pulse (!) 136   Temp 97.9 F (36.6 C) (Oral)   Resp (!) 24   Ht 5' (1.524 m)   Wt 51.4 kg (113 lb 5.1 oz)   SpO2 93%   BMI 22.13 kg/m   Intake/Output Summary (Last 24 hours) at 07/19/16 0824 Last data filed at  07/19/16 0600  Gross per 24 hour  Intake           1434.6 ml  Output              945 ml  Net            489.6 ml   Weight change: -0.9 kg (-1 lb 15.8 oz)  Physical Exam: ZOX:[id number]:Intubated, awake and nods to questions CVS: Pulse currently irregular tachycardia (105), no murmurs/Gallop Resp: Clear to auscultation bilaterally-no rales Abd: Soft, flat, nontender Ext: No lower extremity edema noted  Imaging: Dg Chest Port 1 View  Result Date: 07/19/2016 CLINICAL DATA:  Intubated patient.  Respiratory distress. EXAM: PORTABLE CHEST 1 VIEW COMPARISON:  07/18/2016 and multiple prior studies. FINDINGS: The right basilar opacity obscures the hemidiaphragm consistent with a moderate-sized pleural effusion. Opacity at the left lung base is also noted consistent small to moderate pleural effusion. There is vascular congestion with bilateral interstitial thickening. Hazy airspace opacities noted in the right perihilar and left upper lobe. No pneumothorax. Endotracheal tube tip projects 3 cm above the Carina, stable. Nasal/orogastric tube passes below the diaphragm into the stomach, also unchanged. IMPRESSION: 1. Persistent bilateral pleural effusions, vascular congestion and interstitial thickening. Patchy areas of airspace opacity noted, which show a variable distribution over the course of the last several chest radiographs. Findings are most consistent with persistent asymmetric pulmonary edem. Overall, no significant change from the most recent prior study. Electronically Signed   By: Amie Portlandavid  Ormond M.D.   On: 07/19/2016 07:25   Dg Chest Port 1 View  Result Date: 07/18/2016 CLINICAL DATA:  Acute respiratory  failure with hypoxia EXAM: PORTABLE CHEST 1 VIEW COMPARISON:  Portable chest x-ray of July 17, 2016 FINDINGS: The lungs are adequately inflated. The interstitial markings remain increased. Density over the right pulmonary apex is more conspicuous but is in part due to overlying support apparatus.  There are bilateral pleural effusions greater on the right than on the left. These appear stable. The endotracheal tube tip projects approximately 3.3 cm above the carina. The esophagogastric tube tip projects below the inferior margin of the image. The heart is normal in size. The pulmonary vascularity is indistinct. There is calcification in the wall of the aortic arch. There is moderate dextrocurvature centered in the lower thoracic spine. IMPRESSION: Persistent bilateral pleural effusions and bibasilar atelectasis or infiltrate. Bilateral interstitial edema or infiltrate is slightly less conspicuous. The pulmonary vascularity is less engorged as well. The support tubes are in reasonable position. Electronically Signed   By: David  [place] M.D.   On: 07/18/2016 07:17    Labs: BMET  Recent Labs Lab 07/13/16 0416  07/14/16 0534 07/14/16 1635 07/15/16 0448 07/15/16 1802  07/16/16 0224 07/16/16 1133 07/16/16 1200 07/16/16 1906 07/17/16 0131 07/18/16 0233 07/19/16 0341  NA 142  < > 142  --  144  --   < > 142 139 139 139 138 134* 133*  K 3.0*  < > 4.2  --  4.6  --   < > 5.3* 4.3 4.3 4.1 3.7 3.2* 2.9*  CL 95*  < > 100*  --  98*  --   < > 96* 94* 94* 92* 90* 85* 82*  CO2 31  < > 30  --  33*  --   < > 32 30 34* 32 35* 34* 35*  GLUCOSE 169*  < > 126*  --  122*  --   < > 131* 189* 188* 158* 126* 123* 118*  BUN 73*  < > 67*  --  69*  --   < > 81* 88* 89* 96* 97* 119* 133*  CREATININE 2.47*  < > 2.36*  --  2.16*  --   < > 2.31* 2.55* 2.55* 2.57* 2.74* 2.77* 2.95*  CALCIUM 8.1*  < > 8.5*  --  8.6*  --   < > 9.1 9.0 9.1 8.9 8.7* 8.6* 8.8*  PHOS 4.1  --  3.8 3.9 3.9 3.2  --  2.9  --   --   --   --   --  4.5  < > = values in this interval not displayed. CBC  Recent Labs Lab 07/17/16 0131 07/18/16 0233 07/18/16 0816 07/19/16 0341  WBC 14.6* 12.8* 12.5* 12.4*  HGB 9.2* 9.5* 9.7* 9.5*  HCT 30.0* 30.3* 30.5* 29.7*  MCV 90.6 88.6 87.4 87.1  PLT 146* 184 185 199    Medications:    .  atorvastatin  20 mg Oral q1800  . chlorhexidine  15 mL Mouth Rinse BID  . docusate  100 mg Oral BID  . famotidine  20 mg Oral QHS  . feeding supplement (PRO-STAT SUGAR FREE 64)  30 mL Per Tube Daily  . furosemide  160 mg Intravenous Q8H  . insulin aspart  0-9 Units Subcutaneous Q4H  . ipratropium  0.5 mg Nebulization TID  . levalbuterol  1.25 mg Nebulization TID  . mouth rinse  15 mL Mouth Rinse 10 times per day  . metoprolol tartrate  25 mg Per Tube Q4H  . rocuronium  5 mg/kg Intravenous Once  . sodium chloride flush  3  mL Intravenous Q12H   Zetta Bills, MD 07/19/2016, 8:24 AM

## 2016-07-19 NOTE — Progress Notes (Signed)
eLink Physician-Brief Progress Note Patient Name: Sydney Jacobs DOB: 18-Jul-1934 MRN: [id number]   Date of Service  07/19/2016  HPI/Events of Note  K low  eICU Interventions  Give K     Intervention Category Major Interventions: Other:  Nadya Hopwood 07/19/2016, 5:39 AM

## 2016-07-19 NOTE — Progress Notes (Signed)
PULMONARY / CRITICAL CARE MEDICINE   Name: Tandy Gawlsie H Sloss MRN: [id number] DOB: [birth year]    ADMISSION DATE:  07/10/2016 CONSULTATION DATE:  9/4  REFERRING MD:  Sunnie Nielsenegalado ([facility])   CHIEF COMPLAINT:  Respiratory failure   BRIEF: 82 y/o female with multiple medical problems on oxygen at home intubated on 9/7 in the setting of hypoxemic respiratory failure likely due to diastolic heart failure complicated by Afib and COPD.  SUBJECTIVE:  No events overnight, remains fluid positive.  Difficulty with oxygenation this AM.  VITAL SIGNS: BP 109/83   Pulse (!) 136   Temp 97.9 F (36.6 C) (Oral)   Resp (!) 24   Ht 5' (1.524 m)   Wt 51.4 kg (113 lb 5.1 oz)   SpO2 95%   BMI 22.13 kg/m   HEMODYNAMICS:    VENTILATOR SETTINGS: Vent Mode: PCV FiO2 (%):  [40 %-50 %] 50 % Set Rate:  [16 bmp] 16 bmp Vt Set:  [380 mL] 380 mL PEEP:  [5 cmH20-10 cmH20] 10 cmH20 Plateau Pressure:  [12 cmH20-25 cmH20] 25 cmH20  INTAKE / OUTPUT: I/O last 3 completed shifts: In: 2402.2 [I.V.:954.2; NG/GT:1250; IV Piggyback:198] Out: 1490 [Urine:1490]  PHYSICAL EXAMINATION: General:  Chronically ill appearing on vent, arousable. HENT: NCAT ETT in place PULM crackles bilaterally, vent supported breaths CV: Irreg irreg, tachycardic, less JVD today GI: BS+, soft, nontender MSK: normal bulk and tone Derm: some bruising Neuro: opens eyes to voice, moving all ext to command  LABS:  BMET  Recent Labs Lab 07/17/16 0131 07/18/16 0233 07/19/16 0341  NA 138 134* 133*  K 3.7 3.2* 2.9*  CL 90* 85* 82*  CO2 35* 34* 35*  BUN 97* 119* 133*  CREATININE 2.74* 2.77* 2.95*  GLUCOSE 126* 123* 118*   Electrolytes  Recent Labs Lab 07/15/16 1802 07/15/16 2109 07/16/16 0224  07/17/16 0131 07/18/16 0233 07/19/16 0341  CALCIUM  --  9.0 9.1  < > 8.7* 8.6* 8.8*  MG 1.8 1.9 2.3  --   --   --  2.0  PHOS 3.2  --  2.9  --   --   --  4.5  < > = values in this interval not displayed. CBC  Recent Labs Lab  07/18/16 0233 07/18/16 0816 07/19/16 0341  WBC 12.8* 12.5* 12.4*  HGB 9.5* 9.7* 9.5*  HCT 30.3* 30.5* 29.7*  PLT 184 185 199   Coag's No results for input(s): APTT, INR in the last 168 hours. Sepsis Markers  Recent Labs Lab 07/14/16 0534  PROCALCITON 0.19   ABG  Recent Labs Lab 07/15/16 0500 07/16/16 0500 07/19/16 0405  PHART 7.424 7.428 7.459*  PCO2ART 55.9* 52.4* 51.0*  PO2ART 73.3* 158* 74.4*   Liver Enzymes No results for input(s): AST, ALT, ALKPHOS, BILITOT, ALBUMIN in the last 168 hours. Cardiac Enzymes No results for input(s): TROPONINI, PROBNP in the last 168 hours. Glucose  Recent Labs Lab 07/18/16 1235 07/18/16 1624 07/18/16 1955 07/18/16 2352 07/19/16 0336 07/19/16 0758  GLUCAP 150* 180* 157* 126* 142* 149*   Imaging Dg Chest Port 1 View  Result Date: 07/19/2016 CLINICAL DATA:  Intubated patient.  Respiratory distress. EXAM: PORTABLE CHEST 1 VIEW COMPARISON:  07/18/2016 and multiple prior studies. FINDINGS: The right basilar opacity obscures the hemidiaphragm consistent with a moderate-sized pleural effusion. Opacity at the left lung base is also noted consistent small to moderate pleural effusion. There is vascular congestion with bilateral interstitial thickening. Hazy airspace opacities noted in the right perihilar and left upper lobe. No  pneumothorax. Endotracheal tube tip projects 3 cm above the Carina, stable. Nasal/orogastric tube passes below the diaphragm into the stomach, also unchanged. IMPRESSION: 1. Persistent bilateral pleural effusions, vascular congestion and interstitial thickening. Patchy areas of airspace opacity noted, which show a variable distribution over the course of the last several chest radiographs. Findings are most consistent with persistent asymmetric pulmonary edem. Overall, no significant change from the most recent prior study. Electronically Signed   By: Amie Portland M.D.   On: 07/19/2016 07:25   STUDIES:  2D echo  8/7>>> EF 60-65%, grade 2 diastolic dysfunction, mod MR  CULTURES: 9/10 resp culture >   ANTIBIOTICS: None  SIGNIFICANT EVENTS: Intubated 9/7  LINES/TUBES: ETT 9/7>>>  DISCUSSION: 82yo female with acute on chronic respiratory failure r/t decompensated heart failure with underlying COPD.  ASSESSMENT / PLAN:  RENAL Acute on CKD IV  Hypokalemia  P:   ?short term CRRT, not a candidate for long term dialysis given over all functional status. Lasix per renal. Monitor BMET and UOP. Replace electrolytes as needed. Hold albumin.  PULMONARY Acute on chronic respiratory failure with hypoxemia due to decompensated diastolic heart failure with underlying COPD  Doubt secondary process likely pneumonia or inflammatory pneumonitis Bilateral Pleural effusion L>R COPD without acute exacerbation  Known pulmonary nodule  P:   Continue full vent support but switch to PCV given air hunger and desaturation. VAP prevention. Continue diuresis per renal, not sure if that is wise given over all functional status. May need thoracentesis but will hold off for now until renal status has been addressed and after family meeting today.  CARDIOVASCULAR Acute on chronic diastolic heart failure> exam 9/9 supports ongoing volume overload  AFib with RVR 9/6 HTN  P:  Cardiology following Lopressor and amiodarone per cardiology See renal  GASTROINTESTINAL No active issue  P:   Tube feedings per protocol Pepcid  HEMATOLOGIC Chronic anemia History of retroperitoneal bleeding P:  Hold anticoagulation   INFECTIOUS No active issue  P:   Monitor WBC, fever curve off abx  Send resp culture today  ENDOCRINE Hyperglycemia - no known hx DM   P:   Monitor glucose on chem   NEUROLOGIC Sedation needs for vent synchrony P:   Versed PRN Fentanyl drip Supportive care  FAMILY  - Updates: Awaiting arrival of renal and will discuss plan of care.  Summary: Overall not making much headway.   Renal does not feel patient is a good dialysis candidate.  Will pursue renal artery stenosis as above and consult with palliative care.  I do not think further aggressive care in this situation is in the patient's best interest.  Will continue support for now until able to meet with family.    The patient is critically ill with multiple organ systems failure and requires high complexity decision making for assessment and support, frequent evaluation and titration of therapies, application of advanced monitoring technologies and extensive interpretation of multiple databases.   Critical Care Time devoted to patient care services described in this note is  35  Minutes. This time reflects time of care of this signee Dr Koren Bound. This critical care time does not reflect procedure time, or teaching time or supervisory time of PA/NP/Med student/Med Resident etc but could involve care discussion time.  [name], M.D. [facility] Pulmonary/Critical Care Medicine. Pager: [phone]. After hours pager: [phone].

## 2016-07-19 NOTE — Procedures (Signed)
Hemodialysis Catheter Insertion Procedure Note Sydney Jacobs [id number] 06/01/1934  Procedure: Insertion of Hemodialysis Catheter Indications: CRRT  Procedure Details Consent: Risks of procedure as well as the alternatives and risks of each were explained to the (patient/caregiver).  Consent for procedure obtained. Time Out: Verified patient identification, verified procedure, site/side was marked, verified correct patient position, special equipment/implants available, medications/allergies/relevent history reviewed, required imaging and test results available.  Performed  Maximum sterile technique was used including antiseptics, cap, gloves, gown, hand hygiene, mask and sheet. Skin prep: Chlorhexidine; local anesthetic administered A antimicrobial bonded/coated triple lumen catheter was placed in the right internal jugular vein using the Seldinger technique.  Evaluation Blood flow good Complications: No apparent complications Patient did tolerate procedure well. Chest X-ray ordered to verify placement.  CXR: pending.  Procedure performed under direct ultrasound guidance for real time vessel cannulation.      Sydney Jacobs, [place] Sydney Jacobs Pgr: [phone]  or [phone] 07/19/2016, 12:00 PM  Sydney Jacobs, M.D. Sydney Jacobs. Pager: [phone]. After hours pager: [phone].

## 2016-07-19 NOTE — Progress Notes (Signed)
Spoke with family with renal present bedside, will place HD catheter and start CRRT temporarily, if successful then extubation and DNR, if not then comfort care.  Alyson ReedyWesam G. Martavious Hartel, M.D. [facility] Pulmonary/Critical Care Medicine. Pager: [phone]. After hours pager: [phone].

## 2016-07-20 ENCOUNTER — Inpatient Hospital Stay (HOSPITAL_COMMUNITY): Payer: Commercial Managed Care - HMO

## 2016-07-20 LAB — RENAL FUNCTION PANEL
ANION GAP: 7 (ref 5–15)
ANION GAP: 10 (ref 5–15)
Albumin: 2.7 g/dL — ABNORMAL LOW (ref 3.5–5.0)
Albumin: 2.8 g/dL — ABNORMAL LOW (ref 3.5–5.0)
BUN: 48 mg/dL — AB (ref 6–20)
BUN: 36 mg/dL — AB (ref 6–20)
CHLORIDE: 100 mmol/L — AB (ref 101–111)
CO2: 30 mmol/L (ref 22–32)
CO2: 27 mmol/L (ref 22–32)
Calcium: 8.3 mg/dL — ABNORMAL LOW (ref 8.9–10.3)
Calcium: 8.6 mg/dL — ABNORMAL LOW (ref 8.9–10.3)
Chloride: 99 mmol/L — ABNORMAL LOW (ref 101–111)
Creatinine, Ser: 1.19 mg/dL — ABNORMAL HIGH (ref 0.44–1.00)
Creatinine, Ser: 1.04 mg/dL — ABNORMAL HIGH (ref 0.44–1.00)
GFR calc Af Amer: 49 mL/min — ABNORMAL LOW (ref >60)
GFR calc Af Amer: 58 mL/min — ABNORMAL LOW (ref >60)
GFR calc non Af Amer: 42 mL/min — ABNORMAL LOW (ref >60)
GFR, EST NON AFRICAN AMERICAN: 50 mL/min — AB (ref >60)
GLUCOSE: 123 mg/dL — AB (ref 65–99)
GLUCOSE: 111 mg/dL — AB (ref 65–99)
POTASSIUM: 3.8 mmol/L (ref 3.5–5.1)
POTASSIUM: 4 mmol/L (ref 3.5–5.1)
Phosphorus: 2 mg/dL — ABNORMAL LOW (ref 2.5–4.6)
Phosphorus: 2.3 mg/dL — ABNORMAL LOW (ref 2.5–4.6)
Sodium: 136 mmol/L (ref 135–145)
Sodium: 137 mmol/L (ref 135–145)

## 2016-07-20 LAB — GLUCOSE, CAPILLARY
GLUCOSE-CAPILLARY: 125 mg/dL — AB (ref 65–99)
GLUCOSE-CAPILLARY: 133 mg/dL — AB (ref 65–99)
GLUCOSE-CAPILLARY: 157 mg/dL — AB (ref 65–99)
GLUCOSE-CAPILLARY: 95 mg/dL (ref 65–99)
Glucose-Capillary: 136 mg/dL — ABNORMAL HIGH (ref 65–99)
Glucose-Capillary: 161 mg/dL — ABNORMAL HIGH (ref 65–99)

## 2016-07-20 LAB — BLOOD GAS, ARTERIAL
ACID-BASE EXCESS: 4.6 mmol/L — AB (ref 0.0–2.0)
Bicarbonate: 28.6 mmol/L — ABNORMAL HIGH (ref 20.0–28.0)
DRAWN BY: 449561
FIO2: 40
LHR: 16 {breaths}/min
O2 SAT: 97.8 %
PATIENT TEMPERATURE: 97.7
PCO2 ART: 41.3 mmHg (ref 32.0–48.0)
PEEP: 10 cmH2O
PH ART: 7.452 — AB (ref 7.350–7.450)
PRESSURE CONTROL: 20 cmH2O
pO2, Arterial: 95.6 mmHg (ref 83.0–108.0)

## 2016-07-20 LAB — CARBOXYHEMOGLOBIN
CARBOXYHEMOGLOBIN: 0.9 % (ref 0.5–1.5)
Methemoglobin: 0.7 % (ref 0.0–1.5)
O2 Saturation: 50.3 %
Total hemoglobin: 8.9 g/dL — ABNORMAL LOW (ref 12.0–16.0)

## 2016-07-20 LAB — CBC
HEMATOCRIT: 28.7 % — AB (ref 36.0–46.0)
HEMOGLOBIN: 8.9 g/dL — AB (ref 12.0–15.0)
MCH: 27.5 pg (ref 26.0–34.0)
MCHC: 31 g/dL (ref 30.0–36.0)
MCV: 88.6 fL (ref 78.0–100.0)
Platelets: 171 10*3/uL (ref 150–400)
RBC: 3.24 MIL/uL — ABNORMAL LOW (ref 3.87–5.11)
RDW: 15.5 % (ref 11.5–15.5)
WBC: 12 10*3/uL — ABNORMAL HIGH (ref 4.0–10.5)

## 2016-07-20 LAB — MAGNESIUM: Magnesium: 2.2 mg/dL (ref 1.7–2.4)

## 2016-07-20 MED ORDER — ALTEPLASE 2 MG IJ SOLR
2.0000 mg | Freq: Once | INTRAMUSCULAR | Status: AC
  Administered 2016-07-20: 2 mg
  Filled 2016-07-20: qty 2

## 2016-07-20 MED ORDER — SODIUM CHLORIDE 0.9% FLUSH
10.0000 mL | Freq: Two times a day (BID) | INTRAVENOUS | Status: DC
  Administered 2016-07-20 – 2016-07-21 (×2): 10 mL

## 2016-07-20 MED ORDER — SODIUM CHLORIDE 0.9% FLUSH
10.0000 mL | INTRAVENOUS | Status: DC | PRN
  Administered 2016-07-20: 10 mL
  Filled 2016-07-20: qty 40

## 2016-07-20 NOTE — Progress Notes (Signed)
I am unable to draw back blood from red dialysis access port. Port flushes sluggishly. Chest xray reviewed by md and ok to use given. IV team to tpa port.

## 2016-07-20 NOTE — Progress Notes (Signed)
[facility] Progress Note   Assessment/ Plan:   1. AKI on CKD Stage IV: Suspected to be hemodynamically mediated with effects from diastolic heart failure/RAS activation s/p diuretic therapy. Started on CRRT yesterday after lack of success with diuretic therapy for volume unloading-overnight she is net -0.9 L and anticipated to be able to be more negative today after a full day of ultrafiltration-may need extracorporeal anticoagulation (heparin not used because of recent retroperitoneal hemorrhage). 2. Acute on chronic respiratory failure: From decompensated heart failure with underlying COPD and bilateral pleural effusions. Ongoing ventilator support with efforts at ultrafiltration. 3. Acute exacerbation of chronic diastolic heart failure: Failed escalating diuretic therapy-now on CRRT/UF 4. Hypokalemia: Likely from ongoing diuretic use/wasting with limited intake-replaced via enteral route and now corrected with CRRT 5. Hyponatremia: Secondary to CHF exacerbation with acute on chronic renal failure and impaired free water handling-and correcting with CRRT.  Subjective:   Converted back into sinus rhythm overnight, improved blood pressures and now off of Levophed.    Objective:   BP (!) 147/68   Pulse 66   Temp 97.7 F (36.5 C) (Axillary)   Resp 16   Ht 5' (1.524 m)   Wt 51.2 kg (112 lb 14 oz)   SpO2 100%   BMI 22.04 kg/m   Intake/Output Summary (Last 24 hours) at 07/20/16 1610 Last data filed at 07/20/16 0800  Gross per 24 hour  Intake          1707.35 ml  Output             2681 ml  Net          -973.65 ml   Weight change: -0.2 kg (-7.1 oz)  Physical Exam: RUE:[id number], awake and nods to questions CVS: Pulse regular rhythm, S1 and S2 normal Resp: Clear to auscultation bilaterally-no rales Abd: Soft, flat, nontender Ext: No lower extremity edema noted  Imaging: Dg Chest Port 1 View  Result Date: 07/20/2016 CLINICAL DATA:  Hemodialysis catheter not  flushing well EXAM: PORTABLE CHEST 1 VIEW COMPARISON:  07/19/2016 FINDINGS: Endotracheal tube is 3.7 cm above the carina. Enteric tube extends into the stomach and beyond the inferior edge of the image. Right jugular central line extends into the SVC. No pneumothorax is evident. Effusions are present in both bases. Mild basilar lung opacities persist. Partial clearance of right upper lobe patchy airspace opacity. IMPRESSION: 1. Satisfactorily positioned support equipment 2. No pneumothorax. 3. Persistent pleural effusions. Partial clearance of right upper lobe patchy airspace opacity. Electronically Signed   By: Ellery Plunk M.D.   On: 07/20/2016 05:11   Dg Chest Port 1 View  Result Date: 07/19/2016 CLINICAL DATA:  Status post central line placement. History of atrial fibrillation, COPD, CHF, CVA. EXAM: PORTABLE CHEST 1 VIEW COMPARISON:  Portable chest x-ray of July 19, 2016 at 5:07 a.m. FINDINGS: There has been interval placement of a right internal jugular Cordis sheath. No postprocedure pneumothorax is observed. Interval worsening of interstitial infiltrates throughout the right lung. Slight interval worsening of left upper lobe and left basilar infiltrate as well. The heart is normal in size. The pulmonary vascularity is prominent centrally but there is no definite cephalization. There is calcification in the wall of the aortic arch. The endotracheal tube tip measures 4.7 cm above the carina. The esophagogastric tube tip projects in the proximal gastric body with the proximal port at the level of the GE junction. IMPRESSION: 1. No postprocedure complication following placement of the right internal jugular Cordis sheath.  2. Advancement of the nasogastric tube by 10-20 cm is recommended. The endotracheal tube appears to be in appropriate position. 3. Worsening of bilateral interstitial and alveolar opacities consistent with progressive pneumonia. Minimal if any CHF. 4. Aortic atherosclerosis.  Electronically Signed   By: David  [place] M.D.   On: 07/19/2016 12:11   Dg Chest Port 1 View  Result Date: 07/19/2016 CLINICAL DATA:  Intubated patient.  Respiratory distress. EXAM: PORTABLE CHEST 1 VIEW COMPARISON:  07/18/2016 and multiple prior studies. FINDINGS: The right basilar opacity obscures the hemidiaphragm consistent with a moderate-sized pleural effusion. Opacity at the left lung base is also noted consistent small to moderate pleural effusion. There is vascular congestion with bilateral interstitial thickening. Hazy airspace opacities noted in the right perihilar and left upper lobe. No pneumothorax. Endotracheal tube tip projects 3 cm above the Carina, stable. Nasal/orogastric tube passes below the diaphragm into the stomach, also unchanged. IMPRESSION: 1. Persistent bilateral pleural effusions, vascular congestion and interstitial thickening. Patchy areas of airspace opacity noted, which show a variable distribution over the course of the last several chest radiographs. Findings are most consistent with persistent asymmetric pulmonary edem. Overall, no significant change from the most recent prior study. Electronically Signed   By: Amie Portland M.D.   On: 07/19/2016 07:25    Labs: BMET  Recent Labs Lab 07/14/16 1635 07/15/16 0448 07/15/16 1802  07/16/16 0224  07/16/16 1200 07/16/16 1906 07/17/16 0131 07/18/16 0233 07/19/16 0341 07/19/16 1614 07/20/16 0335  NA  --  144  --   < > 142  < > 139 139 138 134* 133* 135 136  K  --  4.6  --   < > 5.3*  < > 4.3 4.1 3.7 3.2* 2.9* 4.0 3.8  CL  --  98*  --   < > 96*  < > 94* 92* 90* 85* 82* 90* 99*  CO2  --  33*  --   < > 32  < > 34* 32 35* 34* 35* 32 30  GLUCOSE  --  122*  --   < > 131*  < > 188* 158* 126* 123* 118* 178* 123*  BUN  --  69*  --   < > 81*  < > 89* 96* 97* 119* 133* 104* 48*  CREATININE  --  2.16*  --   < > 2.31*  < > 2.55* 2.57* 2.74* 2.77* 2.95* 2.19* 1.19*  CALCIUM  --  8.6*  --   < > 9.1  < > 9.1 8.9 8.7* 8.6* 8.8*  8.6* 8.3*  PHOS 3.9 3.9 3.2  --  2.9  --   --   --   --   --  4.5 3.6 2.0*  < > = values in this interval not displayed. CBC  Recent Labs Lab 07/18/16 0233 07/18/16 0816 07/19/16 0341 07/20/16 0335  WBC 12.8* 12.5* 12.4* 12.0*  HGB 9.5* 9.7* 9.5* 8.9*  HCT 30.3* 30.5* 29.7* 28.7*  MCV 88.6 87.4 87.1 88.6  PLT 184 185 199 171    Medications:    . atorvastatin  20 mg Oral q1800  . chlorhexidine  15 mL Mouth Rinse BID  . docusate  100 mg Oral BID  . famotidine  20 mg Oral QHS  . feeding supplement (PRO-STAT SUGAR FREE 64)  30 mL Per Tube Daily  . insulin aspart  0-9 Units Subcutaneous Q4H  . ipratropium  0.5 mg Nebulization TID  . levalbuterol  1.25 mg Nebulization TID  . mouth  rinse  15 mL Mouth Rinse 10 times per day  . metoprolol tartrate  25 mg Per Tube Q4H  . rocuronium  5 mg/kg Intravenous Once  . sodium chloride flush  10-40 mL Intracatheter Q12H  . sodium chloride flush  3 mL Intravenous Q12H   Zetta BillsJay Jdyn Parkerson, MD 07/20/2016, 8:37 AM

## 2016-07-20 NOTE — Progress Notes (Signed)
PULMONARY / CRITICAL CARE MEDICINE   Name: Sydney Jacobs MRN: [id number] DOB: [birth year]    ADMISSION DATE:  07/10/2016 CONSULTATION DATE:  9/4  REFERRING MD:  Sunnie Nielsenegalado ([facility])   CHIEF COMPLAINT:  Respiratory failure   BRIEF: 82 y/o female with multiple medical problems on oxygen at home intubated on 9/7 in the setting of hypoxemic respiratory failure likely due to diastolic heart failure complicated by Afib and COPD.  SUBJECTIVE:  No events overnight, awake and interactive  VITAL SIGNS: BP (!) 147/68   Pulse 66   Temp 97.7 F (36.5 C) (Axillary)   Resp 16   Ht 5' (1.524 m)   Wt 51.2 kg (112 lb 14 oz)   SpO2 100%   BMI 22.04 kg/m   HEMODYNAMICS:    VENTILATOR SETTINGS: Vent Mode: PCV FiO2 (%):  [40 %-50 %] 40 % Set Rate:  [16 bmp] 16 bmp PEEP:  [10 cmH20] 10 cmH20 Plateau Pressure:  [18 cmH20-25 cmH20] 18 cmH20  INTAKE / OUTPUT: I/O last 3 completed shifts: In: 2495.3 [I.V.:998.1; NG/GT:1497.3] Out: 3031 [Urine:1173; Other:1858]  PHYSICAL EXAMINATION: General:  Chronically ill appearing on vent, arousable. HENT: NCAT ETT in place. PULM crackles bilaterally, vent supported breaths. CV: Irreg irreg, tachycardic, less JVD today. GI: BS+, soft, nontender. MSK: normal bulk and tone. Derm: some bruising. Neuro: opens eyes to voice, moving all ext to command.  LABS:  BMET  Recent Labs Lab 07/19/16 0341 07/19/16 1614 07/20/16 0335  NA 133* 135 136  K 2.9* 4.0 3.8  CL 82* 90* 99*  CO2 35* 32 30  BUN 133* 104* 48*  CREATININE 2.95* 2.19* 1.19*  GLUCOSE 118* 178* 123*   Electrolytes  Recent Labs Lab 07/16/16 0224  07/19/16 0341 07/19/16 1614 07/20/16 0335  CALCIUM 9.1  < > 8.8* 8.6* 8.3*  MG 2.3  --  2.0  --  2.2  PHOS 2.9  --  4.5 3.6 2.0*  < > = values in this interval not displayed. CBC  Recent Labs Lab 07/18/16 0816 07/19/16 0341 07/20/16 0335  WBC 12.5* 12.4* 12.0*  HGB 9.7* 9.5* 8.9*  HCT 30.5* 29.7* 28.7*  PLT 185 199 171    Coag's No results for input(s): APTT, INR in the last 168 hours. Sepsis Markers  Recent Labs Lab 07/14/16 0534  PROCALCITON 0.19   ABG  Recent Labs Lab 07/16/16 0500 07/19/16 0405 07/20/16 0438  PHART 7.428 7.459* 7.452*  PCO2ART 52.4* 51.0* 41.3  PO2ART 158* 74.4* 95.6   Liver Enzymes  Recent Labs Lab 07/19/16 1614 07/20/16 0335  ALBUMIN 3.0* 2.7*   Cardiac Enzymes No results for input(s): TROPONINI, PROBNP in the last 168 hours. Glucose  Recent Labs Lab 07/19/16 1128 07/19/16 1549 07/19/16 2000 07/20/16 0006 07/20/16 0329 07/20/16 0726  GLUCAP 200* 172* 110* 157* 125* 133*   Imaging Dg Chest Port 1 View  Result Date: 07/20/2016 CLINICAL DATA:  Hemodialysis catheter not flushing well EXAM: PORTABLE CHEST 1 VIEW COMPARISON:  07/19/2016 FINDINGS: Endotracheal tube is 3.7 cm above the carina. Enteric tube extends into the stomach and beyond the inferior edge of the image. Right jugular central line extends into the SVC. No pneumothorax is evident. Effusions are present in both bases. Mild basilar lung opacities persist. Partial clearance of right upper lobe patchy airspace opacity. IMPRESSION: 1. Satisfactorily positioned support equipment 2. No pneumothorax. 3. Persistent pleural effusions. Partial clearance of right upper lobe patchy airspace opacity. Electronically Signed   By: Ellery Plunkaniel R Mitchell M.D.   On: 07/20/2016  05:11   Dg Chest Port 1 View  Result Date: 07/19/2016 CLINICAL DATA:  Status post central line placement. History of atrial fibrillation, COPD, CHF, CVA. EXAM: PORTABLE CHEST 1 VIEW COMPARISON:  Portable chest x-ray of July 19, 2016 at 5:07 a.m. FINDINGS: There has been interval placement of a right internal jugular Cordis sheath. No postprocedure pneumothorax is observed. Interval worsening of interstitial infiltrates throughout the right lung. Slight interval worsening of left upper lobe and left basilar infiltrate as well. The heart is normal  in size. The pulmonary vascularity is prominent centrally but there is no definite cephalization. There is calcification in the wall of the aortic arch. The endotracheal tube tip measures 4.7 cm above the carina. The esophagogastric tube tip projects in the proximal gastric body with the proximal port at the level of the GE junction. IMPRESSION: 1. No postprocedure complication following placement of the right internal jugular Cordis sheath. 2. Advancement of the nasogastric tube by 10-20 cm is recommended. The endotracheal tube appears to be in appropriate position. 3. Worsening of bilateral interstitial and alveolar opacities consistent with progressive pneumonia. Minimal if any CHF. 4. Aortic atherosclerosis. Electronically Signed   By: David  [place] M.D.   On: 07/19/2016 12:11   STUDIES:  2D echo 8/7>>> EF 60-65%, grade 2 diastolic dysfunction, mod MR  CULTURES: 9/10 resp culture >   ANTIBIOTICS: None  SIGNIFICANT EVENTS: Intubated 9/7  LINES/TUBES: ETT 9/7>>> R IJ HD catheter 9/12>>>  DISCUSSION: 82 yo female with acute on chronic respiratory failure r/t decompensated heart failure with underlying COPD.  ASSESSMENT / PLAN:  RENAL Acute on CKD IV  Hypokalemia  P:   CRRT -100-150. D/C lasix. Monitor BMET and UOP. Replace electrolytes as needed. D/C albumin.  PULMONARY Acute on chronic respiratory failure with hypoxemia due to decompensated diastolic heart failure with underlying COPD  Doubt secondary process likely pneumonia or inflammatory pneumonitis Bilateral Pleural effusion L>R COPD without acute exacerbation  Known pulmonary nodule  P:   Begin PS trials and drop PEEP to 5. VAP prevention. CRRT negative as above.  CARDIOVASCULAR Acute on chronic diastolic heart failure> exam 9/9 supports ongoing volume overload  AFib with RVR 9/6 HTN  P:  Cardiology following. D/C lopressor and continue amiodarone per cardiology. See renal.  GASTROINTESTINAL No active  issue  P:   Tube feedings per protocol. Pepcid.  HEMATOLOGIC Chronic anemia History of retroperitoneal bleeding P:  Hold anticoagulation   INFECTIOUS No active issue  P:   Monitor WBC, fever curve off abx  Send resp culture today  ENDOCRINE Hyperglycemia - no known hx DM   P:   Monitor glucose on chem   NEUROLOGIC Sedation needs for vent synchrony P:   Versed PRN. Fentanyl drip. Supportive care.  FAMILY  - Updates: Family updated bedside.  The patient is critically ill with multiple organ systems failure and requires high complexity decision making for assessment and support, frequent evaluation and titration of therapies, application of advanced monitoring technologies and extensive interpretation of multiple databases.   Critical Care Time devoted to patient care services described in this note is  35  Minutes. This time reflects time of care of this signee Dr Koren Bound. This critical care time does not reflect procedure time, or teaching time or supervisory time of PA/NP/Med student/Med Resident etc but could involve care discussion time.  [name], M.D. [facility] Pulmonary/Critical Care Medicine. Pager: [phone]. After hours pager: [phone].

## 2016-07-20 NOTE — Progress Notes (Signed)
Filter was changed at 1800 but at 1900 return pressure was extreme leading to filter clot. A new filter placed but return pressure continue to be extreme. CRRT unit moved to opposite side of the bed to circumvent pressure  Issues. Catheter is very positional. Will continue to monitor.

## 2016-07-21 ENCOUNTER — Inpatient Hospital Stay (HOSPITAL_COMMUNITY): Payer: Commercial Managed Care - HMO

## 2016-07-21 LAB — GLUCOSE, CAPILLARY
GLUCOSE-CAPILLARY: 126 mg/dL — AB (ref 65–99)
GLUCOSE-CAPILLARY: 158 mg/dL — AB (ref 65–99)
Glucose-Capillary: 154 mg/dL — ABNORMAL HIGH (ref 65–99)
Glucose-Capillary: 109 mg/dL — ABNORMAL HIGH (ref 65–99)

## 2016-07-21 LAB — RENAL FUNCTION PANEL
ANION GAP: 7 (ref 5–15)
Albumin: 2.3 g/dL — ABNORMAL LOW (ref 3.5–5.0)
BUN: 47 mg/dL — ABNORMAL HIGH (ref 6–20)
CHLORIDE: 102 mmol/L (ref 101–111)
CO2: 27 mmol/L (ref 22–32)
Calcium: 8.1 mg/dL — ABNORMAL LOW (ref 8.9–10.3)
Creatinine, Ser: 1.58 mg/dL — ABNORMAL HIGH (ref 0.44–1.00)
GFR calc Af Amer: 35 mL/min — ABNORMAL LOW (ref >60)
GFR calc non Af Amer: 30 mL/min — ABNORMAL LOW (ref >60)
GLUCOSE: 152 mg/dL — AB (ref 65–99)
PHOSPHORUS: 2.8 mg/dL (ref 2.5–4.6)
POTASSIUM: 4 mmol/L (ref 3.5–5.1)
Sodium: 136 mmol/L (ref 135–145)

## 2016-07-21 LAB — HEPATIC FUNCTION PANEL
ALBUMIN: 2.4 g/dL — AB (ref 3.5–5.0)
ALK PHOS: 49 U/L (ref 38–126)
ALT: 16 U/L (ref 14–54)
AST: 14 U/L — AB (ref 15–41)
BILIRUBIN TOTAL: 0.6 mg/dL (ref 0.3–1.2)
Bilirubin, Direct: 0.2 mg/dL (ref 0.1–0.5)
Indirect Bilirubin: 0.4 mg/dL (ref 0.3–0.9)
Total Protein: 4.7 g/dL — ABNORMAL LOW (ref 6.5–8.1)

## 2016-07-21 LAB — CBC
HCT: 23.1 % — ABNORMAL LOW (ref 36.0–46.0)
Hemoglobin: 7.2 g/dL — ABNORMAL LOW (ref 12.0–15.0)
MCH: 27.7 pg (ref 26.0–34.0)
MCHC: 31.2 g/dL (ref 30.0–36.0)
MCV: 88.8 fL (ref 78.0–100.0)
PLATELETS: 128 10*3/uL — AB (ref 150–400)
RBC: 2.6 MIL/uL — AB (ref 3.87–5.11)
RDW: 16 % — ABNORMAL HIGH (ref 11.5–15.5)
WBC: 11 10*3/uL — ABNORMAL HIGH (ref 4.0–10.5)

## 2016-07-21 LAB — MAGNESIUM: Magnesium: 2.2 mg/dL (ref 1.7–2.4)

## 2016-07-21 LAB — POCT I-STAT 3, ART BLOOD GAS (G3+)
Acid-Base Excess: 4 mmol/L — ABNORMAL HIGH (ref 0.0–2.0)
BICARBONATE: 28 mmol/L (ref 20.0–28.0)
O2 SAT: 100 %
PCO2 ART: 40.7 mmHg (ref 32.0–48.0)
Patient temperature: 97.9
TCO2: 29 mmol/L (ref 0–100)
pH, Arterial: 7.445 (ref 7.350–7.450)
pO2, Arterial: 220 mmHg — ABNORMAL HIGH (ref 83.0–108.0)

## 2016-07-21 NOTE — Progress Notes (Signed)
PULMONARY / CRITICAL CARE MEDICINE   Name: Sydney Jacobs MRN: [id number] DOB: 07/19/1934    ADMISSION DATE:  07/10/2016 CONSULTATION DATE:  9/4  REFERRING MD:  Sunnie Nielsen ([facility])   CHIEF COMPLAINT:  Respiratory failure   BRIEF: 82 y/o female with multiple medical problems on oxygen at home intubated on 9/7 in the setting of hypoxemic respiratory failure likely due to diastolic heart failure complicated by Afib and COPD.  SUBJECTIVE:  Line obstructed overnight, unable to perform CVVH  VITAL SIGNS: BP (!) 101/45 (BP Location: Left Arm)   Pulse 74   Temp 97.9 F (36.6 C) (Oral)   Resp 16   Ht 5' (1.524 m)   Wt 51.2 kg (112 lb 14 oz)   SpO2 96%   BMI 22.04 kg/m   HEMODYNAMICS:    VENTILATOR SETTINGS: Vent Mode: PCV FiO2 (%):  [30 %-40 %] 30 % Set Rate:  [16 bmp] 16 bmp PEEP:  [5 cmH20] 5 cmH20 Plateau Pressure:  [17 cmH20-18 cmH20] 18 cmH20  INTAKE / OUTPUT: I/O last 3 completed shifts: In: 2629.8 [I.V.:989.8; NG/GT:1640] Out: 5257 [Urine:243; Other:5014]  PHYSICAL EXAMINATION: General:  Chronically ill appearing on vent, arousable. HENT: NCAT ETT in place. PULM crackles bilaterally, vent supported breaths. CV: Irreg irreg, tachycardic, less JVD today. GI: BS+, soft, nontender. MSK: normal bulk and tone. Derm: some bruising. Neuro: opens eyes to voice, moving all ext to command.  LABS:  BMET  Recent Labs Lab 07/20/16 0335 07/20/16 1704 07/21/16 0345  NA 136 137 136  K 3.8 4.0 4.0  CL 99* 100* 102  CO2 30 27 27   BUN 48* 36* 47*  CREATININE 1.19* 1.04* 1.58*  GLUCOSE 123* 111* 152*   Electrolytes  Recent Labs Lab 07/19/16 0341  07/20/16 0335 07/20/16 1704 07/21/16 0345  CALCIUM 8.8*  < > 8.3* 8.6* 8.1*  MG 2.0  --  2.2  --  2.2  PHOS 4.5  < > 2.0* 2.3* 2.8  < > = values in this interval not displayed. CBC  Recent Labs Lab 07/19/16 0341 07/20/16 0335 07/21/16 0345  WBC 12.4* 12.0* 11.0*  HGB 9.5* 8.9* 7.2*  HCT 29.7* 28.7* 23.1*   PLT 199 171 128*   Coag's No results for input(s): APTT, INR in the last 168 hours. Sepsis Markers No results for input(s): LATICACIDVEN, PROCALCITON, O2SATVEN in the last 168 hours. ABG  Recent Labs Lab 07/19/16 0405 07/20/16 0438 07/21/16 0311  PHART 7.459* 7.452* 7.445  PCO2ART 51.0* 41.3 40.7  PO2ART 74.4* 95.6 220.0*   Liver Enzymes  Recent Labs Lab 07/20/16 0335 07/20/16 1704 07/21/16 0345  AST  --   --  14*  ALT  --   --  16  ALKPHOS  --   --  49  BILITOT  --   --  0.6  ALBUMIN 2.7* 2.8* 2.4*  2.3*   Cardiac Enzymes No results for input(s): TROPONINI, PROBNP in the last 168 hours. Glucose  Recent Labs Lab 07/20/16 1141 07/20/16 1509 07/20/16 2023 07/21/16 0014 07/21/16 0341 07/21/16 0720  GLUCAP 136* 95 161* 126* 154* 109*   Imaging Dg Chest Port 1 View  Result Date: 07/21/2016 CLINICAL DATA:  Respiratory failure, CHF, history of COPD, intubated patient. EXAM: PORTABLE CHEST 1 VIEW COMPARISON:  Portable chest x-ray of July 20, 2016 FINDINGS: The lungs are adequately inflated. Persistent bibasilar alveolar opacities are present with obscuration of the hemidiaphragms. The bilateral pleural effusions greater on the right. Hazy interstitial density in the right upper lobe is  stable. The heart is normal in size. The pulmonary vascularity is prominent centrally. There is calcification in the wall of the aortic arch. The endotracheal tube tip lies 3.7 cm above the carina. The esophagogastric tube tip appears to terminate in the mid esophagus which reflects a change since yesterday's study. The right internal jugular Cordis sheath tip projects over the proximal third of the SVC. IMPRESSION: Persistent bibasilar atelectasis or pneumonia with bilateral pleural effusions. Mild interstitial prominence in the right upper lobe persists. The endotracheal tube is in reasonable position. Aortic atherosclerosis. The esophagogastric tube has been apparently withdrawn such  that its tip overlies the mid esophagus. Advancement by approximately 30 cm is recommended. Electronically Signed   By: David  [place]azilandJordan M.D.   On: 07/21/2016 07:20   Dg Abd Portable 1v  Result Date: 07/21/2016 CLINICAL DATA:  OG tube placement EXAM: PORTABLE ABDOMEN - 1 VIEW COMPARISON:  KUB of 07/14/2016, CT of the abdomen pelvis of 07/13/2016 FINDINGS: The OG tube tip overlies the region of the proximal body of the stomach. There is little change in opacity at both lung bases most consistent with bilateral pleural effusions and basilar atelectasis. The bowel gas pattern is nonspecific. Right hip replacement is present. As noted on prior CT, and abdominal aortic aneurysm is noted of 4.2 cm in diameter. IMPRESSION: 1. OG tube tip overlies the region of the midbody of the stomach. 2. Probable bilateral pleural effusions and basilar atelectasis. 3. Distal abdominal aortic aneurysm of approximately 4.2 cm. Electronically Signed   By: Dwyane DeePaul  Barry M.D.   On: 07/21/2016 08:51   STUDIES:  2D echo 8/7>>> EF 60-65%, grade 2 diastolic dysfunction, mod MR  CULTURES: 9/10 resp culture >   ANTIBIOTICS: None  SIGNIFICANT EVENTS: Intubated 9/7  LINES/TUBES: ETT 9/7>>>9/14 R IJ HD catheter 9/12>>>  DISCUSSION: 82 yo female with acute on chronic respiratory failure r/t decompensated heart failure with underlying COPD.  ASSESSMENT / PLAN:  RENAL Acute on CKD IV  Hypokalemia  P:   CRRT off due to catheter function. D/Ced lasix. Monitor BMET and UOP. Replace electrolytes as needed.  PULMONARY Acute on chronic respiratory failure with hypoxemia due to decompensated diastolic heart failure with underlying COPD  Doubt secondary process likely pneumonia or inflammatory pneumonitis Bilateral Pleural effusion L>R COPD without acute exacerbation  Known pulmonary nodule  P:   Extubate IS per RT protocol D/C CRRT Spoke with patient, she no longer wishes to have a catheter replaced and wants the ETT  out, spoke with family, one way extubation.    CARDIOVASCULAR Acute on chronic diastolic heart failure> exam 9/9 supports ongoing volume overload  AFib with RVR 9/6 HTN  P:  Cardiology following. Continue amiodarone per cardiology. See renal.  GASTROINTESTINAL No active issue  P:   Swallow evaluation post extubation Pepcid.  HEMATOLOGIC Chronic anemia History of retroperitoneal bleeding P:  Hold anticoagulation   INFECTIOUS No active issue  P:   Monitor WBC, fever curve off abx   ENDOCRINE Hyperglycemia - no known hx DM   P:   Monitor glucose on chem   NEUROLOGIC Sedation needs for vent synchrony P:   D/C sedation Supportive care. Morphine if patient starts to deteriorate from a respiratory standpoint.  FAMILY  - Updates: Family updated bedside.  Extubate today with no intention to reintubate, will make full DNR.  The patient is critically ill with multiple organ systems failure and requires high complexity decision making for assessment and support, frequent evaluation and titration of therapies, application  of advanced monitoring technologies and extensive interpretation of multiple databases.   Critical Care Time devoted to patient care services described in this note is  35  Minutes. This time reflects time of care of this signee Dr Wesam Yacoub. This critical care time does not reflect procedure time, or teaching time or supervisory time of PA/NP/Med student/Med Resident etc but could involve care discussion time.  [name], M.D. [place] Pulmonary/Critical Care Medicine. Pager: [phone]. After hours pager: [phone]. 

## 2016-07-21 NOTE — Progress Notes (Signed)
[facility] Progress Note   Assessment/ Plan:   1. AKI on CKD Stage IV: Suspected to be hemodynamically mediated with effects from diastolic heart failure/RAS activation s/p diuretic therapy. On CRRT until yesterday evening with net -1.9 L. CRRT stopped because of frequent filter clotting/blood loss attributed to the dialysis catheter which will likely need to be changed this morning. She remains anuric. 2. Acute on chronic respiratory failure: From decompensated heart failure with underlying COPD and bilateral pleural effusions. Will reattempt ultrafiltration with CRRT when possible. 3. Acute exacerbation of chronic diastolic heart failure: Failed escalating diuretic therapy-now on CRRT/UF 4. Hypokalemia: Likely from ongoing diuretic use/wasting with limited intake-replaced via enteral route and now corrected with CRRT 5. Hyponatremia: Secondary to CHF exacerbation with acute on chronic renal failure and impaired free water handling- appears to have been corrected with CRRT.  Subjective:   Problems with CRRT yesterday prompting discontinuation of therapy.    Objective:   BP (!) 101/45 (BP Location: Left Arm)   Pulse 74   Temp 97.9 F (36.6 C) (Oral)   Resp 16   Ht 5' (1.524 m)   Wt 51.2 kg (112 lb 14 oz)   SpO2 96%   BMI 22.04 kg/m   Intake/Output Summary (Last 24 hours) at 07/21/16 0817 Last data filed at 07/21/16 0700  Gross per 24 hour  Intake           1646.6 ml  Output             3599 ml  Net          -1952.4 ml   Weight change: 0 kg (0 lb)  Physical Exam: [id number], Sedated, appears comfortable CVS: Pulse regular rhythm, S1 and S2 normal Resp: Clear to auscultation bilaterally-no rales Abd: Soft, flat, nontender Ext: No lower extremity edema noted  Imaging: Dg Chest Port 1 View  Result Date: 07/21/2016 CLINICAL DATA:  Respiratory failure, CHF, history of COPD, intubated patient. EXAM: PORTABLE CHEST 1 VIEW COMPARISON:  Portable chest x-ray of  July 20, 2016 FINDINGS: The lungs are adequately inflated. Persistent bibasilar alveolar opacities are present with obscuration of the hemidiaphragms. The bilateral pleural effusions greater on the right. Hazy interstitial density in the right upper lobe is stable. The heart is normal in size. The pulmonary vascularity is prominent centrally. There is calcification in the wall of the aortic arch. The endotracheal tube tip lies 3.7 cm above the carina. The esophagogastric tube tip appears to terminate in the mid esophagus which reflects a change since yesterday's study. The right internal jugular Cordis sheath tip projects over the proximal third of the SVC. IMPRESSION: Persistent bibasilar atelectasis or pneumonia with bilateral pleural effusions. Mild interstitial prominence in the right upper lobe persists. The endotracheal tube is in reasonable position. Aortic atherosclerosis. The esophagogastric tube has been apparently withdrawn such that its tip overlies the mid esophagus. Advancement by approximately 30 cm is recommended. Electronically Signed   By: David  [place]il[place] M.D.   On: 07/21/2016 07:20   Dg Chest Port 1 View  Result Date: 07/20/2016 CLINICAL DATA:  Hemodialysis catheter not flushing well EXAM: PORTABLE CHEST 1 VIEW COMPARISON:  07/19/2016 FINDINGS: Endotracheal tube is 3.7 cm above the carina. Enteric tube extends into the stomach and beyond the inferior edge of the image. Right jugular central line extends into the SVC. No pneumothorax is evident. Effusions are present in both bases. Mild basilar lung opacities persist. Partial clearance of right upper lobe patchy airspace opacity. IMPRESSION: 1. Satisfactorily  positioned support equipment 2. No pneumothorax. 3. Persistent pleural effusions. Partial clearance of right upper lobe patchy airspace opacity. Electronically Signed   By: Ellery Plunkaniel R Mitchell M.D.   On: 07/20/2016 05:11   Dg Chest Port 1 View  Result Date: 07/19/2016 CLINICAL DATA:   Status post central line placement. History of atrial fibrillation, COPD, CHF, CVA. EXAM: PORTABLE CHEST 1 VIEW COMPARISON:  Portable chest x-ray of July 19, 2016 at 5:07 a.m. FINDINGS: There has been interval placement of a right internal jugular Cordis sheath. No postprocedure pneumothorax is observed. Interval worsening of interstitial infiltrates throughout the right lung. Slight interval worsening of left upper lobe and left basilar infiltrate as well. The heart is normal in size. The pulmonary vascularity is prominent centrally but there is no definite cephalization. There is calcification in the wall of the aortic arch. The endotracheal tube tip measures 4.7 cm above the carina. The esophagogastric tube tip projects in the proximal gastric body with the proximal port at the level of the GE junction. IMPRESSION: 1. No postprocedure complication following placement of the right internal jugular Cordis sheath. 2. Advancement of the nasogastric tube by 10-20 cm is recommended. The endotracheal tube appears to be in appropriate position. 3. Worsening of bilateral interstitial and alveolar opacities consistent with progressive pneumonia. Minimal if any CHF. 4. Aortic atherosclerosis. Electronically Signed   By: David  [place]ilandJordan M.D.   On: 07/19/2016 12:11    Labs: BMET  Recent Labs Lab 07/15/16 1802  07/16/16 0224  07/17/16 0131 07/18/16 0233 07/19/16 0341 07/19/16 1614 07/20/16 0335 07/20/16 1704 07/21/16 0345  NA  --   < > 142  < > 138 134* 133* 135 136 137 136  K  --   < > 5.3*  < > 3.7 3.2* 2.9* 4.0 3.8 4.0 4.0  CL  --   < > 96*  < > 90* 85* 82* 90* 99* 100* 102  CO2  --   < > 32  < > 35* 34* 35* 32 30 27 27   GLUCOSE  --   < > 131*  < > 126* 123* 118* 178* 123* 111* 152*  BUN  --   < > 81*  < > 97* 119* 133* 104* 48* 36* 47*  CREATININE  --   < > 2.31*  < > 2.74* 2.77* 2.95* 2.19* 1.19* 1.04* 1.58*  CALCIUM  --   < > 9.1  < > 8.7* 8.6* 8.8* 8.6* 8.3* 8.6* 8.1*  PHOS 3.2  --  2.9  --    --   --  4.5 3.6 2.0* 2.3* 2.8  < > = values in this interval not displayed. CBC  Recent Labs Lab 07/18/16 0816 07/19/16 0341 07/20/16 0335 07/21/16 0345  WBC 12.5* 12.4* 12.0* 11.0*  HGB 9.7* 9.5* 8.9* 7.2*  HCT 30.5* 29.7* 28.7* 23.1*  MCV 87.4 87.1 88.6 88.8  PLT 185 199 171 128*    Medications:    . atorvastatin  20 mg Oral q1800  . chlorhexidine  15 mL Mouth Rinse BID  . docusate  100 mg Oral BID  . famotidine  20 mg Oral QHS  . feeding supplement (PRO-STAT SUGAR FREE 64)  30 mL Per Tube Daily  . insulin aspart  0-9 Units Subcutaneous Q4H  . ipratropium  0.5 mg Nebulization TID  . levalbuterol  1.25 mg Nebulization TID  . mouth rinse  15 mL Mouth Rinse 10 times per day  . rocuronium  5 mg/kg Intravenous Once  .  sodium chloride flush  10-40 mL Intracatheter Q12H  . sodium chloride flush  3 mL Intravenous Q12H   Zetta Bills, MD 07/21/2016, 8:17 AM

## 2016-07-21 NOTE — Progress Notes (Signed)
CRRT started at 0230 with new crrt unit and pressure dressing held on dialysis catheter. Access pressure were extreme, disabling blood pump motor. Blood was returned to patient and the catheter was heparin locked. Per NP Renae FicklePaul, morning team to readdress issue.

## 2016-07-21 NOTE — Progress Notes (Signed)
Return and access pressures are extreme and all motors disabled. Filter clotted from blood stasis. Unable to return blood. CCmd notified to assess dialysis catheter.Therapy on hold until catheter is assessed by Joneen RoachPaul Hoffman, NP

## 2016-07-21 NOTE — Procedures (Signed)
Extubation Procedure Note  Patient Details:   Name: Sydney Jacobs DOB: 1934/01/09 MRN: [id number]   Airway Documentation:     Evaluation  O2 sats: stable throughout Complications: No apparent complications Patient did tolerate procedure well. Bilateral Breath Sounds: Clear, Diminished   Yes   Pt extubated per MD order to 2L . Pt able to speak her name but pt too weak to cough. Pt transitioning to comfort care. RT will continue to monitor.   [name], [name] 07/21/2016, 12:58 PM

## 2016-07-22 MED ORDER — LORAZEPAM 2 MG/ML IJ SOLN
1.0000 mg | INTRAMUSCULAR | Status: DC | PRN
  Administered 2016-07-25 – 2016-07-27 (×2): 2 mg via INTRAVENOUS
  Filled 2016-07-22 (×2): qty 1

## 2016-07-22 MED ORDER — ACETAMINOPHEN 650 MG RE SUPP
650.0000 mg | RECTAL | Status: DC | PRN
  Filled 2016-07-22: qty 1

## 2016-07-22 MED ORDER — ATROPINE SULFATE 1 % OP SOLN
4.0000 [drp] | OPHTHALMIC | Status: DC | PRN
  Administered 2016-07-27 (×2): 4 [drp] via SUBLINGUAL
  Filled 2016-07-22: qty 2
  Filled 2016-07-22: qty 5

## 2016-07-22 MED ORDER — FENTANYL CITRATE (PF) 100 MCG/2ML IJ SOLN
50.0000 ug | INTRAMUSCULAR | Status: DC | PRN
  Administered 2016-07-27: 50 ug via INTRAVENOUS
  Filled 2016-07-22: qty 2

## 2016-07-22 NOTE — Progress Notes (Signed)
Patient ID: Sydney Jacobs, female   DOB: 04-24-1934, 82 y.o.   MRN: [id number] After discussion with the patient/family-CRRT stopped and one-way extubation performed yesterday afternoon with the goal to transition to comfort/palliative care. Will sign off at this time, please call back if needed. Sydney BillsJay Jakory Matsuo MD [facility]. Office # [phone] Pager # [phone] 8:33 AM

## 2016-07-22 NOTE — Progress Notes (Signed)
PCCM PROGRESS NOTE  Admission Date: 07/10/16 Referring Provider: Sunnie Nielsenegalado, [facility]  CC: Short of breath  Description: 82 yo female with sudden onset of dyspnea from acute on chronic diastolic CHF.  She has hx of COPD on 4 liters oxygen, CKD IV, AAA with retroperitoneal bleeding 06/23/16, PAF.  [facility]  She was seen by cardiology.  She failed Bipap and required intubation 9/07.  She developed progressive renal failure and nephrology consulted.  Tried on CRRT >> stopped due to catheter complications.  Pt opted to not have dialysis catheter replaced.  Instead wanted to focus on comfort measures.  DNR order placed, and she was extubated 9/14.  Subjective: Denies chest/abd pain.  Vital signs: BP (!) 122/51 (BP Location: Left Arm)   Pulse 69   Temp 97.7 F (36.5 C) (Oral)   Resp 13   Ht 5' (1.524 m)   Wt 112 lb 14 oz (51.2 kg)   SpO2 97%   BMI 22.04 kg/m   General: somnolent Neuro: opens eyes with stimulation HEENT: no stridor Cardiac: irregular, 2/6 SM Chest: basilar crackles b/l Abd: soft, non tender Ext: 1+ edema Skin: no rashes   CMP Latest Ref Rng & Units 07/21/2016 07/20/2016 07/20/2016  Glucose 65 - 99 mg/dL 161(W152(H) 960(A111(H) 540(J123(H)  BUN 6 - 20 mg/dL 81(X47(H) 91(Y36(H) 78(G48(H)  Creatinine 0.44 - 1.00 mg/dL 9.56(O1.58(H) 1.30(Q1.04(H) 6.57(Q1.19(H)  Sodium 135 - 145 mmol/L 136 137 136  Potassium 3.5 - 5.1 mmol/L 4.0 4.0 3.8  Chloride 101 - 111 mmol/L 102 100(L) 99(L)  CO2 22 - 32 mmol/L 27 27 30   Calcium 8.9 - 10.3 mg/dL 8.1(L) 8.6(L) 8.3(L)  Total Protein 6.5 - 8.1 g/dL 4.7(L) - -  Total Bilirubin 0.3 - 1.2 mg/dL 0.6 - -  Alkaline Phos 38 - 126 U/L 49 - -  AST 15 - 41 U/L 14(L) - -  ALT 14 - 54 U/L 16 - -    CBC Latest Ref Rng & Units 07/21/2016 07/20/2016 07/19/2016  WBC 4.0 - 10.5 K/uL 11.0(H) 12.0(H) 12.4(H)  Hemoglobin 12.0 - 15.0 g/dL 7.2(L) 8.9(L) 9.5(L)  Hematocrit 36.0 - 46.0 % 23.1(L) 28.7(L) 29.7(L)  Platelets 150 - 400 K/uL 128(L) 171 199    Dg Chest Port 1  View  Result Date: 07/21/2016 CLINICAL DATA:  Respiratory failure, CHF, history of COPD, intubated patient. EXAM: PORTABLE CHEST 1 VIEW COMPARISON:  Portable chest x-ray of July 20, 2016 FINDINGS: The lungs are adequately inflated. Persistent bibasilar alveolar opacities are present with obscuration of the hemidiaphragms. The bilateral pleural effusions greater on the right. Hazy interstitial density in the right upper lobe is stable. The heart is normal in size. The pulmonary vascularity is prominent centrally. There is calcification in the wall of the aortic arch. The endotracheal tube tip lies 3.7 cm above the carina. The esophagogastric tube tip appears to terminate in the mid esophagus which reflects a change since yesterday's study. The right internal jugular Cordis sheath tip projects over the proximal third of the SVC. IMPRESSION: Persistent bibasilar atelectasis or pneumonia with bilateral pleural effusions. Mild interstitial prominence in the right upper lobe persists. The endotracheal tube is in reasonable position. Aortic atherosclerosis. The esophagogastric tube has been apparently withdrawn such that its tip overlies the mid esophagus. Advancement by approximately 30 cm is recommended. Electronically Signed   By: David  Swazil[place] M.D.   On: 07/21/2016 07:20   Dg Abd Portable 1v  Result Date: 07/21/2016 CLINICAL DATA:  OG tube placement EXAM: PORTABLE  ABDOMEN - 1 VIEW COMPARISON:  [facility] of 07/14/2016, CT of the abdomen pelvis of 07/13/2016 FINDINGS: The OG tube tip overlies the region of the proximal body of the stomach. There is little change in opacity at both lung bases most consistent with bilateral pleural effusions and basilar atelectasis. The bowel gas pattern is nonspecific. Right hip replacement is present. As noted on prior CT, and abdominal aortic aneurysm is noted of 4.2 cm in diameter. IMPRESSION: 1. OG tube tip overlies the region of the midbody of the stomach. 2. Probable bilateral  pleural effusions and basilar atelectasis. 3. Distal abdominal aortic aneurysm of approximately 4.2 cm. Electronically Signed   By: Dwyane Dee M.D.   On: 07/21/2016 08:51    Assessment: Acute on chronic hypoxic respiratory failure Acute pulmonary edema AKI CKD 4 Acute on chronic diastolic CHF Atrial fibrillation with RVR Hx of COPD Moderate mitral regurgitation Hypokalemia Hyponatremia Bilateral pleural effusions Hx of pulmonary nodule Hx of HTN Anemia of critical illness and chronic disease Hyperglycemia Acute metabolic encephalopathy Elevated troponin from demand ischemia Hx of AAA Hx of CVA  Plan: DNR Fentanyl, Ativan for pain control and anxiety Tylenol PR for fever Transfer to med-surg floor bed  Will ask [facility] to resume care from 9/16 and PCCM off.  Coralyn Helling, MD [facility] Pulmonary/Critical Care 07/22/2016, 9:02 AM Pager:  [phone] After 3pm call: [phone]

## 2016-07-22 NOTE — Progress Notes (Signed)
Upon assessment of pt this am, pt states seh is comfortable with no difficulty breathing or pain. Fentanyl gtt still at 50 mcg. Family at bedside support given.

## 2016-07-23 NOTE — Progress Notes (Addendum)
[facility] PROGRESS NOTE  CORITA ALLINSON ZOX:[id number] DOB: [birth year] DOA: 07/10/2016   PCP: Colette Ribas, MD     Assessment/Plan: Principal Problem:   Acute on chronic diastolic congestive heart failure (HCC) Active Problems:   Elevated troponin   Chronic kidney disease (CKD), stage IV (severe) (HCC)   Essential hypertension   COPD (chronic obstructive pulmonary disease) (HCC)   Retroperitoneal hematoma in 8/17   PAF (paroxysmal atrial fibrillation) (HCC)   AAA (abdominal aortic aneurysm) (HCC)   Chronic respiratory failure (HCC)   Normocytic anemia   Hypokalemia   Atrial fibrillation with RVR (HCC)   Acute respiratory failure with hypoxemia (HCC)   Acute pulmonary edema (HCC)   82[facility] with multiple medical problems including chronic respiratory failure on the basis of dCHF and COPD on home O2, CKD III,  pulmonary nodule, PAF, chest pain with recent cath 05/2016 without significant CAD, retroperitoneal bleed s/p recent cath .  Presented 9/3 with worsening SOB.  She was admitted by [facility] to [facility] on bipap with acute on chronic respiratory failure r/t acute on chronic CHF +/- COPD.  On 9/4 she remained extremely SOB, failed brief trial off bipap and PCCM consulted. Seen in cardiology clinic 07/06/16 by PA Dunn with increased SOB. Lasix changed to torsemide 20mg  daily  Assessment and plan Acute on chronic respiratory failure with hypoxemia due to decompensated diastolic heart failure with underlying COPD  She failed Bipap and required intubation 9/07.  She developed progressive renal failure and nephrology consulted.  Tried on CRRT >> stopped due to catheter complications.  Pt opted to not have dialysis catheter replaced.  Instead wanted to focus on comfort measures.  DNR order placed, and she was extubated 9/14. Fentanyl, Ativan for pain control and anxiety [facility] to resume care from 9/16 and PCCM off.   DVT prophylaxsis  none  Code Status:  DO NOT RESUSCITATE    Family Communication: Discussed in detail with the patient, all imaging results, lab results explained to the patient   Disposition Plan:  Anticipate hospital death      Consultants:  Critical care  Cardiology  Procedures:  None  Antibiotics: Anti-infectives    None         HPI/Subjective: Multiple family members by bedside ,states she is now comfortable   Objective: Vitals:   07/22/16 0000 07/22/16 0400 07/22/16 1104 07/23/16 0500  BP: (!) 123/51 (!) 122/51 (!) 141/58 (!) 133/52  Pulse: 77 69 85 77  Resp: 15 13 (!) 22 12  Temp: 98.9 F (37.2 C) 97.7 F (36.5 C) 98.2 F (36.8 C) 98.2 F (36.8 C)  TempSrc: Oral Oral Oral   SpO2: 97% 97% 92% 93%  Weight:      Height:        Intake/Output Summary (Last 24 hours) at 07/23/16 1020 Last data filed at 07/23/16 0500  Gross per 24 hour  Intake               65 ml  Output              250 ml  Net             -185 ml      General: somnolent Neuro: opens eyes with stimulation HEENT: no stridor Cardiac: irregular, 2/6 SM Chest: basilar crackles b/l Abd: soft, non tender Ext: 1+ edema Skin: no rashes    Data Reviewed: I have personally reviewed following labs  and imaging studies  Micro Results Recent Results (from the past 240 hour(s))  Culture, respiratory (NON-Expectorated)     Status: None   Collection Time: 07/17/16  8:30 AM  Result Value Ref Range Status   Specimen Description TRACHEAL ASPIRATE  Final   Special Requests NONE  Final   Gram Stain   Final    MODERATE WBC PRESENT, PREDOMINANTLY PMN RARE SQUAMOUS EPITHELIAL CELLS PRESENT MODERATE GRAM POSITIVE COCCI IN PAIRS FEW GRAM VARIABLE ROD    Culture Consistent with normal respiratory flora.  Final   Report Status 07/19/2016 FINAL  Final    Radiology Reports Ct Abdomen Pelvis Wo Contrast  Result Date: 07/13/2016 CLINICAL DATA:  Anemia. Rule out retroperitoneal hemorrhage. Aneurysm. EXAM: CT  ABDOMEN AND PELVIS WITHOUT CONTRAST TECHNIQUE: Multidetector CT imaging of the abdomen and pelvis was performed following the standard protocol without IV contrast. COMPARISON:  CT abdomen pelvis 05/28/2016 FINDINGS: Lower chest: Moderately large bilateral pleural effusions have increased in size since the prior CT. There is compressive atelectasis in both lung bases. Left ventricular enlargement. Calcification of the mitral annulus again noted. Hepatobiliary: Liver gallbladder and bile ducts within normal limits. Pancreas: Negative Spleen: Negative Adrenals/Urinary Tract: Large right upper pole renal cyst measuring 9 x 11 cm. This displaces the kidney inferiorly. No renal hydronephrosis. Atrophic left kidney likely due to renovascular disease. Left renal cyst 4 cm. Sub cm hyperdense nodule left upper pole unchanged. Urinary bladder decompressed with Foley catheter Stomach/Bowel: Stomach and duodenum normal. Negative for bowel obstruction. No bowel mass or edema identified. Sigmoid diverticulosis. Negative for diverticulitis. Vascular/Lymphatic: Atherosclerotic calcifications are present diffusely. Infrarenal abdominal aortic aneurysm measures 3.9 x 4.2 cm. Intimal calcifications remain displaced into the lumen unchanged. No retroperitoneal hemorrhage around the aneurysm. No evidence of aneurysm leak. Atherosclerotic calcification extends throughout the iliac arteries bilaterally. Reproductive: Hysterectomy.  No pelvic mass. Other: Right inguinal hematoma has improved now measuring 2.4 cm. Extraperitoneal hemorrhage in the right iliac fossa has nearly completely resolved. No new area of hemorrhage. Musculoskeletal: Lumbar disc and facet degeneration. No acute skeletal abnormality right hip replacement. Mild levoscoliosis. IMPRESSION: Near complete resolution of right inguinal hematoma and extraperitoneal hematoma on the right. No new area of hemorrhage. Atherosclerotic infrarenal abdominal aortic aneurysm measures  3.9 x 4.2 cm without evidence of leak. Moderately large bilateral pleural effusions with bibasilar atelectasis. Atrophic left kidney. Bilateral renal cyst. Large right renal cyst. Electronically Signed   By: Marlan Palau M.D.   On: 07/13/2016 11:21   Dg Chest Port 1 View  Result Date: 07/21/2016 CLINICAL DATA:  Respiratory failure, CHF, history of COPD, intubated patient. EXAM: PORTABLE CHEST 1 VIEW COMPARISON:  Portable chest x-ray of July 20, 2016 FINDINGS: The lungs are adequately inflated. Persistent bibasilar alveolar opacities are present with obscuration of the hemidiaphragms. The bilateral pleural effusions greater on the right. Hazy interstitial density in the right upper lobe is stable. The heart is normal in size. The pulmonary vascularity is prominent centrally. There is calcification in the wall of the aortic arch. The endotracheal tube tip lies 3.7 cm above the carina. The esophagogastric tube tip appears to terminate in the mid esophagus which reflects a change since yesterday's study. The right internal jugular Cordis sheath tip projects over the proximal third of the SVC. IMPRESSION: Persistent bibasilar atelectasis or pneumonia with bilateral pleural effusions. Mild interstitial prominence in the right upper lobe persists. The endotracheal tube is in reasonable position. Aortic atherosclerosis. The esophagogastric tube has been apparently withdrawn such that its  tip overlies the mid esophagus. Advancement by approximately 30 cm is recommended. Electronically Signed   By: David  [place] M.D.   On: 07/21/2016 07:20   Dg Chest Port 1 View  Result Date: 07/20/2016 CLINICAL DATA:  Hemodialysis catheter not flushing well EXAM: PORTABLE CHEST 1 VIEW COMPARISON:  07/19/2016 FINDINGS: Endotracheal tube is 3.7 cm above the carina. Enteric tube extends into the stomach and beyond the inferior edge of the image. Right jugular central line extends into the SVC. No pneumothorax is evident.  Effusions are present in both bases. Mild basilar lung opacities persist. Partial clearance of right upper lobe patchy airspace opacity. IMPRESSION: 1. Satisfactorily positioned support equipment 2. No pneumothorax. 3. Persistent pleural effusions. Partial clearance of right upper lobe patchy airspace opacity. Electronically Signed   By: Ellery Plunk M.D.   On: 07/20/2016 05:11   Dg Chest Port 1 View  Result Date: 07/19/2016 CLINICAL DATA:  Status post central line placement. History of atrial fibrillation, COPD, CHF, CVA. EXAM: PORTABLE CHEST 1 VIEW COMPARISON:  Portable chest x-ray of July 19, 2016 at 5:07 a.m. FINDINGS: There has been interval placement of a right internal jugular Cordis sheath. No postprocedure pneumothorax is observed. Interval worsening of interstitial infiltrates throughout the right lung. Slight interval worsening of left upper lobe and left basilar infiltrate as well. The heart is normal in size. The pulmonary vascularity is prominent centrally but there is no definite cephalization. There is calcification in the wall of the aortic arch. The endotracheal tube tip measures 4.7 cm above the carina. The esophagogastric tube tip projects in the proximal gastric body with the proximal port at the level of the GE junction. IMPRESSION: 1. No postprocedure complication following placement of the right internal jugular Cordis sheath. 2. Advancement of the nasogastric tube by 10-20 cm is recommended. The endotracheal tube appears to be in appropriate position. 3. Worsening of bilateral interstitial and alveolar opacities consistent with progressive pneumonia. Minimal if any CHF. 4. Aortic atherosclerosis. Electronically Signed   By: David  [place] M.D.   On: 07/19/2016 12:11   Dg Chest Port 1 View  Result Date: 07/19/2016 CLINICAL DATA:  Intubated patient.  Respiratory distress. EXAM: PORTABLE CHEST 1 VIEW COMPARISON:  07/18/2016 and multiple prior studies. FINDINGS: The right  basilar opacity obscures the hemidiaphragm consistent with a moderate-sized pleural effusion. Opacity at the left lung base is also noted consistent small to moderate pleural effusion. There is vascular congestion with bilateral interstitial thickening. Hazy airspace opacities noted in the right perihilar and left upper lobe. No pneumothorax. Endotracheal tube tip projects 3 cm above the Carina, stable. Nasal/orogastric tube passes below the diaphragm into the stomach, also unchanged. IMPRESSION: 1. Persistent bilateral pleural effusions, vascular congestion and interstitial thickening. Patchy areas of airspace opacity noted, which show a variable distribution over the course of the last several chest radiographs. Findings are most consistent with persistent asymmetric pulmonary edem. Overall, no significant change from the most recent prior study. Electronically Signed   By: Amie Portland M.D.   On: 07/19/2016 07:25   Dg Chest Port 1 View  Result Date: 07/18/2016 CLINICAL DATA:  Acute respiratory failure with hypoxia EXAM: PORTABLE CHEST 1 VIEW COMPARISON:  Portable chest x-ray of July 17, 2016 FINDINGS: The lungs are adequately inflated. The interstitial markings remain increased. Density over the right pulmonary apex is more conspicuous but is in part due to overlying support apparatus. There are bilateral pleural effusions greater on the right than on the left. These appear stable.  The endotracheal tube tip projects approximately 3.3 cm above the carina. The esophagogastric tube tip projects below the inferior margin of the image. The heart is normal in size. The pulmonary vascularity is indistinct. There is calcification in the wall of the aortic arch. There is moderate dextrocurvature centered in the lower thoracic spine. IMPRESSION: Persistent bilateral pleural effusions and bibasilar atelectasis or infiltrate. Bilateral interstitial edema or infiltrate is slightly less conspicuous. The pulmonary  vascularity is less engorged as well. The support tubes are in reasonable position. Electronically Signed   By: David  [place] M.D.   On: 07/18/2016 07:17   Dg Chest Port 1 View  Result Date: 07/17/2016 CLINICAL DATA:  Acute respiratory failure with hypoxemia. EXAM: PORTABLE CHEST 1 VIEW COMPARISON:  07/16/2016 FINDINGS: 0513 hours. Endotracheal tube tip is 2.8 cm above the base the carina. The NG tube passes into the stomach although the distal tip position is not included on the film. Bilateral interstitial and basilar airspace opacity persists. Small bilateral pleural effusions are again noted. Telemetry leads overlie the chest. IMPRESSION: No substantial change in exam. Electronically Signed   By: Kennith Center M.D.   On: 07/17/2016 07:19   Dg Chest Port 1 View  Result Date: 07/16/2016 CLINICAL DATA:  Endotracheal tube placement. EXAM: PORTABLE CHEST 1 VIEW COMPARISON:  Radiographs October 14, 2016. FINDINGS: Stable cardiomediastinal silhouette. Atherosclerosis of thoracic aorta is noted. Endotracheal and nasogastric tubes are in grossly good position. Stable bilateral basilar lung opacities are noted concerning for edema with associated pleural effusions. No pneumothorax is noted. Bony thorax is unremarkable. IMPRESSION: Endotracheal and nasogastric tubes in grossly good position. Stable bilateral basilar edema and pleural effusions are noted. Aortic atherosclerosis. Electronically Signed   By: Lupita Raider, M.D.   On: 07/16/2016 09:31   Dg Chest Port 1 View  Result Date: 07/15/2016 CLINICAL DATA:  Intubation, essential hypertension, COPD, CHF, stage III chronic kidney disease, abdominal aortic aneurysm, atrial fibrillation EXAM: PORTABLE CHEST 1 VIEW COMPARISON:  Portable exam 0537 hours compared to 07/14/2016 FINDINGS: Tip of endotracheal tube within 6 mm of carina, recommend withdrawal 1-2 cm. Enlargement of cardiac silhouette. Atherosclerotic calcification aorta. Diffuse BILATERAL pulmonary  infiltrates again identified question edema versus pneumonia. Bibasilar effusions. No pneumothorax. Osseous demineralization. IMPRESSION: Tip of endotracheal tube within 6 mm of carina; recommend withdrawal 1-2 cm. Persistent diffuse BILATERAL airspace infiltrates question edema versus infection with associated bibasilar effusions. Findings called to patient's nurse Dois Davenport RN on [facility] on 07/15/2016 at 0843 hours. Electronically Signed   By: Ulyses Southward M.D.   On: 07/15/2016 08:45   Dg Chest Port 1 View  Result Date: 07/14/2016 CLINICAL DATA:  Intubation . EXAM: PORTABLE CHEST 1 VIEW FINDINGS: Endotracheal tube 1 cm above the lower portion of the carina, withdrawal of approximately 2 cm suggested. NG tube noted with tip below left hemidiaphragm. Heart size normal. Diffuse multifocal bilateral pulmonary infiltrates and/or edema are again noted without interim improvement. Bilateral pleural effusions noted. No pneumothorax. IMPRESSION: 1. Endotracheal tube 1 cm above the lower portion of the carina. Withdrawal of approximately 2 cm suggested. 2. NG tube noted with tip below left hemidiaphragm. 3. Persistent multifocal pulmonary infiltrates are and/or edema again noted. Bilateral pleural effusions again noted. These results will be called to the ordering clinician or representative by the Radiologist Assistant, and communication documented in the PACS or zVision Dashboard. Electronically Signed   By: Maisie Fus  Register   On: 07/14/2016 12:42   Dg Chest Port 1 View  Result Date:  07/14/2016 CLINICAL DATA:  82 year old female with acute respiratory failure. EXAM: PORTABLE CHEST 1 VIEW COMPARISON:  Chest radiograph dated 07/13/2016 FINDINGS: There has been interval development of a small-to-moderate right pleural effusion, new from prior study. Stable appearing left-sided pleural effusion. There is persistent vascular congestion and interstitial edema. Superimposed pneumonia is not excluded. There is no pneumothorax.  Stable mild cardiomegaly. No acute osseous pathology. IMPRESSION: Congestive heart failure with pulmonary edema and interval development of a small to moderate right pleural effusion. Clinical correlation and follow-up recommended. Electronically Signed   By: Elgie Collard M.D.   On: 07/14/2016 01:19   Dg Chest Port 1 View  Result Date: 07/13/2016 CLINICAL DATA:  Congestive heart failure EXAM: PORTABLE CHEST 1 VIEW COMPARISON:  July 12, 2016 FINDINGS: There is widespread interstitial edema with left pleural effusion. There is patchy airspace consolidation in the medial bases bilaterally. There is cardiomegaly with pulmonary venous hypertension. There is atherosclerotic calcification in the aorta. There is no new opacity evident. IMPRESSION: Findings indicative of congestive heart failure without appreciable change from 1 day prior. Stable cardiac silhouette. There is aortic atherosclerosis. Electronically Signed   By: Bretta Bang III M.D.   On: 07/13/2016 07:27   Dg Chest Port 1 View  Result Date: 07/12/2016 CLINICAL DATA:  Acute respiratory failure, history of CHF, COPD, chronic renal insufficiency EXAM: PORTABLE CHEST 1 VIEW COMPARISON:  Portable chest x-ray of July 11, 2016 FINDINGS: The lungs are adequately inflated. The left hemidiaphragm remains obscured. There is some obscuration of the right hemidiaphragm now. The pulmonary interstitial markings are increased bilaterally. There are confluent alveolar opacities in the left upper lobe. The heart is normal in size. There is calcification in the wall of the aortic arch. The trachea is midline. The bony thorax exhibits no acute abnormality. IMPRESSION: Slight interval worsening of pulmonary interstitial edema and bilateral pleural effusions. Persistent pulmonary vascular engorgement. Aortic atherosclerosis. Electronically Signed   By: David  [place] M.D.   On: 07/12/2016 07:12   Dg Chest Port 1 View  Result Date: 07/11/2016 CLINICAL DATA:   Acute on chronic respiratory failure. History of COPD. Worsening shortness of breath. EXAM: PORTABLE CHEST 1 VIEW COMPARISON:  Single-view of the chest 07/10/2016 and 06/14/2016. FINDINGS: Left worse than right airspace disease persists appearing aeration improved improved on the right since the most recent exam. Left pleural effusion is again identified. Heart size is upper normal. Aortic atherosclerosis is seen. IMPRESSION: Extensive bilateral airspace disease shows improvement on the right since the most recent exam. No new abnormality. Left pleural effusion. Electronically Signed   By: Drusilla Kanner M.D.   On: 07/11/2016 19:34   Dg Chest Portable 1 View  Result Date: 07/10/2016 CLINICAL DATA:  Shortness of breath and bilateral lower extremity edema. EXAM: PORTABLE CHEST 1 VIEW COMPARISON:  Single-view of the chest 06/14/2016 and 06/12/2016. FINDINGS: Bilateral airspace disease seen on the most recent examination has worsened. Small to moderate left pleural effusion is unchanged. There is a small right pleural effusion which appears increased. Atherosclerosis is noted. No pneumothorax. IMPRESSION: Worsened bilateral airspace disease compatible with increased pulmonary edema and/or pneumonia. New small right pleural effusion. Small to moderate left pleural effusion is unchanged since the most recent study. Atherosclerosis. Electronically Signed   By: Drusilla Kanner M.D.   On: 07/10/2016 20:09   Dg Abd Portable 1v  Result Date: 07/21/2016 CLINICAL DATA:  OG tube placement EXAM: PORTABLE ABDOMEN - 1 VIEW COMPARISON:  KUB of 07/14/2016, CT of the abdomen pelvis  of 07/13/2016 FINDINGS: The OG tube tip overlies the region of the proximal body of the stomach. There is little change in opacity at both lung bases most consistent with bilateral pleural effusions and basilar atelectasis. The bowel gas pattern is nonspecific. Right hip replacement is present. As noted on prior CT, and abdominal aortic aneurysm  is noted of 4.2 cm in diameter. IMPRESSION: 1. OG tube tip overlies the region of the midbody of the stomach. 2. Probable bilateral pleural effusions and basilar atelectasis. 3. Distal abdominal aortic aneurysm of approximately 4.2 cm. Electronically Signed   By: Dwyane DeePaul  Barry M.D.   On: 07/21/2016 08:51   Dg Abd Portable 1v  Result Date: 07/14/2016 CLINICAL DATA:  Orogastric tube placement EXAM: PORTABLE ABDOMEN - 1 VIEW COMPARISON:  Portable exam 1221 hours compared to CT abdomen and pelvis 07/13/2013 FINDINGS: Tip of orogastric tube projects over proximal to mid stomach. Visualized bowel gas pattern normal. Aortic atherosclerotic calcification extending and iliac arteries. LEFT lower lobe atelectasis versus consolidation with associated bibasilar effusions. Bones demineralized. IMPRESSION: Tip of orogastric tube projects over proximal to mid stomach. Bibasilar effusions with atelectasis versus consolidation in LEFT lower lobe. Aortic atherosclerosis. Electronically Signed   By: Ulyses SouthwardMark  Boles M.D.   On: 07/14/2016 12:45     CBC  Recent Labs Lab 07/18/16 0233 07/18/16 0816 07/19/16 0341 07/20/16 0335 07/21/16 0345  WBC 12.8* 12.5* 12.4* 12.0* 11.0*  HGB 9.5* 9.7* 9.5* 8.9* 7.2*  HCT 30.3* 30.5* 29.7* 28.7* 23.1*  PLT 184 185 199 171 128*  MCV 88.6 87.4 87.1 88.6 88.8  MCH 27.8 27.8 27.9 27.5 27.7  MCHC 31.4 31.8 32.0 31.0 31.2  RDW 15.5 15.6* 15.3 15.5 16.0*    Chemistries   Recent Labs Lab 07/19/16 0341 07/19/16 1614 07/20/16 0335 07/20/16 1704 07/21/16 0345  NA 133* 135 136 137 136  K 2.9* 4.0 3.8 4.0 4.0  CL 82* 90* 99* 100* 102  CO2 35* 32 30 27 27   GLUCOSE 118* 178* 123* 111* 152*  BUN 133* 104* 48* 36* 47*  CREATININE 2.95* 2.19* 1.19* 1.04* 1.58*  CALCIUM 8.8* 8.6* 8.3* 8.6* 8.1*  MG 2.0  --  2.2  --  2.2  AST  --   --   --   --  14*  ALT  --   --   --   --  16  ALKPHOS  --   --   --   --  49  BILITOT  --   --   --   --  0.6    ------------------------------------------------------------------------------------------------------------------ estimated creatinine clearance is 20.7 mL/min (by C-G formula based on SCr of 1.58 mg/dL (H)). ------------------------------------------------------------------------------------------------------------------ No results for input(s): HGBA1C in the last 72 hours. ------------------------------------------------------------------------------------------------------------------ No results for input(s): CHOL, HDL, LDLCALC, TRIG, CHOLHDL, LDLDIRECT in the last 72 hours. ------------------------------------------------------------------------------------------------------------------ No results for input(s): TSH, T4TOTAL, T3FREE, THYROIDAB in the last 72 hours.  Invalid input(s): FREET3 ------------------------------------------------------------------------------------------------------------------ No results for input(s): VITAMINB12, FOLATE, FERRITIN, TIBC, IRON, RETICCTPCT in the last 72 hours.  Coagulation profile No results for input(s): INR, PROTIME in the last 168 hours.  No results for input(s): DDIMER in the last 72 hours.  Cardiac Enzymes No results for input(s): CKMB, TROPONINI, MYOGLOBIN in the last 168 hours.  Invalid input(s): CK ------------------------------------------------------------------------------------------------------------------ Invalid input(s): POCBNP   CBG:  Recent Labs Lab 07/20/16 2023 07/21/16 0014 07/21/16 0341 07/21/16 0720 07/21/16 1204  GLUCAP 161* 126* 154* 109* 158*       Studies: No results found.  No results found for: HGBA1C Lab Results  Component Value Date   LDLCALC (H) 05/06/2007    145        Total Cholesterol/HDL:CHD Risk Coronary Heart Disease Risk Table                     Men   Women  1/2 Average Risk   3.4   3.3   CREATININE 1.58 (H) 07/21/2016       Scheduled Meds:  Continuous Infusions: .  fentaNYL infusion INTRAVENOUS 50 mcg/hr (07/22/16 0125)     LOS: 13 days    Time spent: >30 MINS    [facility]  [facility] Pager [phone]. If 7PM-7AM, please contact night-coverage at [email or web address], password [email or web address] 07/23/2016, 10:20 AM  LOS: 13 days

## 2016-07-24 MED ORDER — DIPHENHYDRAMINE HCL 50 MG/ML IJ SOLN
25.0000 mg | Freq: Four times a day (QID) | INTRAMUSCULAR | Status: DC | PRN
  Administered 2016-07-24: 25 mg via INTRAVENOUS
  Filled 2016-07-24: qty 1

## 2016-07-24 NOTE — Progress Notes (Signed)
[facility] PROGRESS NOTE  Sydney Jacobs ZOX:[id number] DOB: Jun 16, 1934 DOA: 07/10/2016   PCP: Colette RibasGOLDING, JOHN CABOT, MD     Assessment/Plan: Principal Problem:   Acute on chronic diastolic congestive heart failure (HCC) Active Problems:   Elevated troponin   Chronic kidney disease (CKD), stage IV (severe) (HCC)   Essential hypertension   COPD (chronic obstructive pulmonary disease) (HCC)   Retroperitoneal hematoma in 8/17   PAF (paroxysmal atrial fibrillation) (HCC)   AAA (abdominal aortic aneurysm) (HCC)   Chronic respiratory failure (HCC)   Normocytic anemia   Hypokalemia   Atrial fibrillation with RVR (HCC)   Acute respiratory failure with hypoxemia (HCC)   Acute pulmonary edema (HCC)   82 [facility] with multiple medical problems including chronic respiratory failure on the basis of dCHF and COPD on home O2, CKD III,  pulmonary nodule, PAF, chest pain with recent cath 05/2016 without significant CAD, retroperitoneal bleed s/p recent cath .  Presented 9/3 with worsening SOB.  She was admitted by [facility] to [facility]U on bipap with acute on chronic respiratory failure r/t acute on chronic CHF +/- COPD.  On 9/4 she remained extremely SOB, failed brief trial off bipap and PCCM consulted. Seen in cardiology clinic 07/06/16 by PA Dunn with increased SOB. Lasix changed to torsemide 20mg  daily  Assessment and plan Acute on chronic respiratory failure with hypoxemia due to decompensated diastolic heart failure with underlying COPD  She failed Bipap and required intubation 9/07.  She developed progressive renal failure and nephrology consulted.  Tried on CRRT >> stopped due to catheter complications.  Pt opted to not have dialysis catheter replaced.  Instead wanted to focus on comfort measures.  DNR , she was extubated 9/14. Fentanyl, Ativan for pain control and anxiety [facility]  assumed care from 9/16 and PCCM signed off. Continue to titrate fentanyl drip  as per protocol ordered for comfort   DVT prophylaxsis none  Code Status:  DO NOT RESUSCITATE    Family Communication: Discussed in detail with the patient, all imaging results, lab results explained to the patient   Disposition Plan:  Anticipate hospital death      Consultants:  Critical care  Cardiology  Procedures:  None  Antibiotics: Anti-infectives    None         HPI/Subjective: Patient's entire extended family by the bedside and she appears comfortable  Objective: Vitals:   07/22/16 0400 07/22/16 1104 07/23/16 0500 07/24/16 0549  BP: (!) 122/51 (!) 141/58 (!) 133/52 (!) 153/59  Pulse: 69 85 77 71  Resp: 13 (!) 22 12 15   Temp: 97.7 F (36.5 C) 98.2 F (36.8 C) 98.2 F (36.8 C) 97.4 F (36.3 C)  TempSrc: Oral Oral  Axillary  SpO2: 97% 92% 93% 94%  Weight:      Height:        Intake/Output Summary (Last 24 hours) at 07/24/16 0957 Last data filed at 07/24/16 0900  Gross per 24 hour  Intake           185.33 ml  Output               75 ml  Net           110.33 ml      General: somnolent Neuro: opens eyes with stimulation HEENT: no stridor Cardiac: irregular, 2/6 SM Chest: basilar crackles b/l Abd: soft, non tender Ext: 1+ edema Skin: no rashes    Data Reviewed:  I have personally reviewed following labs and imaging studies  Micro Results Recent Results (from the past 240 hour(s))  Culture, respiratory (NON-Expectorated)     Status: None   Collection Time: 07/17/16  8:30 AM  Result Value Ref Range Status   Specimen Description TRACHEAL ASPIRATE  Final   Special Requests NONE  Final   Gram Stain   Final    MODERATE WBC PRESENT, PREDOMINANTLY PMN RARE SQUAMOUS EPITHELIAL CELLS PRESENT MODERATE GRAM POSITIVE COCCI IN PAIRS FEW GRAM VARIABLE ROD    Culture Consistent with normal respiratory flora.  Final   Report Status 07/19/2016 FINAL  Final    Radiology Reports Ct Abdomen Pelvis Wo Contrast  Result Date: 07/13/2016 CLINICAL  DATA:  Anemia. Rule out retroperitoneal hemorrhage. Aneurysm. EXAM: CT ABDOMEN AND PELVIS WITHOUT CONTRAST TECHNIQUE: Multidetector CT imaging of the abdomen and pelvis was performed following the standard protocol without IV contrast. COMPARISON:  CT abdomen pelvis 05/28/2016 FINDINGS: Lower chest: Moderately large bilateral pleural effusions have increased in size since the prior CT. There is compressive atelectasis in both lung bases. Left ventricular enlargement. Calcification of the mitral annulus again noted. Hepatobiliary: Liver gallbladder and bile ducts within normal limits. Pancreas: Negative Spleen: Negative Adrenals/Urinary Tract: Large right upper pole renal cyst measuring 9 x 11 cm. This displaces the kidney inferiorly. No renal hydronephrosis. Atrophic left kidney likely due to renovascular disease. Left renal cyst 4 cm. Sub cm hyperdense nodule left upper pole unchanged. Urinary bladder decompressed with Foley catheter Stomach/Bowel: Stomach and duodenum normal. Negative for bowel obstruction. No bowel mass or edema identified. Sigmoid diverticulosis. Negative for diverticulitis. Vascular/Lymphatic: Atherosclerotic calcifications are present diffusely. Infrarenal abdominal aortic aneurysm measures 3.9 x 4.2 cm. Intimal calcifications remain displaced into the lumen unchanged. No retroperitoneal hemorrhage around the aneurysm. No evidence of aneurysm leak. Atherosclerotic calcification extends throughout the iliac arteries bilaterally. Reproductive: Hysterectomy.  No pelvic mass. Other: Right inguinal hematoma has improved now measuring 2.4 cm. Extraperitoneal hemorrhage in the right iliac fossa has nearly completely resolved. No new area of hemorrhage. Musculoskeletal: Lumbar disc and facet degeneration. No acute skeletal abnormality right hip replacement. Mild levoscoliosis. IMPRESSION: Near complete resolution of right inguinal hematoma and extraperitoneal hematoma on the right. No new area of  hemorrhage. Atherosclerotic infrarenal abdominal aortic aneurysm measures 3.9 x 4.2 cm without evidence of leak. Moderately large bilateral pleural effusions with bibasilar atelectasis. Atrophic left kidney. Bilateral renal cyst. Large right renal cyst. Electronically Signed   By: Marlan Palau M.D.   On: 07/13/2016 11:21   Dg Chest Port 1 View  Result Date: 07/21/2016 CLINICAL DATA:  Respiratory failure, CHF, history of COPD, intubated patient. EXAM: PORTABLE CHEST 1 VIEW COMPARISON:  Portable chest x-ray of July 20, 2016 FINDINGS: The lungs are adequately inflated. Persistent bibasilar alveolar opacities are present with obscuration of the hemidiaphragms. The bilateral pleural effusions greater on the right. Hazy interstitial density in the right upper lobe is stable. The heart is normal in size. The pulmonary vascularity is prominent centrally. There is calcification in the wall of the aortic arch. The endotracheal tube tip lies 3.7 cm above the carina. The esophagogastric tube tip appears to terminate in the mid esophagus which reflects a change since yesterday's study. The right internal jugular Cordis sheath tip projects over the proximal third of the SVC. IMPRESSION: Persistent bibasilar atelectasis or pneumonia with bilateral pleural effusions. Mild interstitial prominence in the right upper lobe persists. The endotracheal tube is in reasonable position. Aortic atherosclerosis. The esophagogastric tube has  been apparently withdrawn such that its tip overlies the mid esophagus. Advancement by approximately 30 cm is recommended. Electronically Signed   By: David  [place] M.D.   On: 07/21/2016 07:20   Dg Chest Port 1 View  Result Date: 07/20/2016 CLINICAL DATA:  Hemodialysis catheter not flushing well EXAM: PORTABLE CHEST 1 VIEW COMPARISON:  07/19/2016 FINDINGS: Endotracheal tube is 3.7 cm above the carina. Enteric tube extends into the stomach and beyond the inferior edge of the image. Right  jugular central line extends into the SVC. No pneumothorax is evident. Effusions are present in both bases. Mild basilar lung opacities persist. Partial clearance of right upper lobe patchy airspace opacity. IMPRESSION: 1. Satisfactorily positioned support equipment 2. No pneumothorax. 3. Persistent pleural effusions. Partial clearance of right upper lobe patchy airspace opacity. Electronically Signed   By: Ellery Plunk M.D.   On: 07/20/2016 05:11   Dg Chest Port 1 View  Result Date: 07/19/2016 CLINICAL DATA:  Status post central line placement. History of atrial fibrillation, COPD, CHF, CVA. EXAM: PORTABLE CHEST 1 VIEW COMPARISON:  Portable chest x-ray of July 19, 2016 at 5:07 a.m. FINDINGS: There has been interval placement of a right internal jugular Cordis sheath. No postprocedure pneumothorax is observed. Interval worsening of interstitial infiltrates throughout the right lung. Slight interval worsening of left upper lobe and left basilar infiltrate as well. The heart is normal in size. The pulmonary vascularity is prominent centrally but there is no definite cephalization. There is calcification in the wall of the aortic arch. The endotracheal tube tip measures 4.7 cm above the carina. The esophagogastric tube tip projects in the proximal gastric body with the proximal port at the level of the GE junction. IMPRESSION: 1. No postprocedure complication following placement of the right internal jugular Cordis sheath. 2. Advancement of the nasogastric tube by 10-20 cm is recommended. The endotracheal tube appears to be in appropriate position. 3. Worsening of bilateral interstitial and alveolar opacities consistent with progressive pneumonia. Minimal if any CHF. 4. Aortic atherosclerosis. Electronically Signed   By: David  [place] M.D.   On: 07/19/2016 12:11   Dg Chest Port 1 View  Result Date: 07/19/2016 CLINICAL DATA:  Intubated patient.  Respiratory distress. EXAM: PORTABLE CHEST 1 VIEW  COMPARISON:  07/18/2016 and multiple prior studies. FINDINGS: The right basilar opacity obscures the hemidiaphragm consistent with a moderate-sized pleural effusion. Opacity at the left lung base is also noted consistent small to moderate pleural effusion. There is vascular congestion with bilateral interstitial thickening. Hazy airspace opacities noted in the right perihilar and left upper lobe. No pneumothorax. Endotracheal tube tip projects 3 cm above the Carina, stable. Nasal/orogastric tube passes below the diaphragm into the stomach, also unchanged. IMPRESSION: 1. Persistent bilateral pleural effusions, vascular congestion and interstitial thickening. Patchy areas of airspace opacity noted, which show a variable distribution over the course of the last several chest radiographs. Findings are most consistent with persistent asymmetric pulmonary edem. Overall, no significant change from the most recent prior study. Electronically Signed   By: Amie Portland M.D.   On: 07/19/2016 07:25   Dg Chest Port 1 View  Result Date: 07/18/2016 CLINICAL DATA:  Acute respiratory failure with hypoxia EXAM: PORTABLE CHEST 1 VIEW COMPARISON:  Portable chest x-ray of July 17, 2016 FINDINGS: The lungs are adequately inflated. The interstitial markings remain increased. Density over the right pulmonary apex is more conspicuous but is in part due to overlying support apparatus. There are bilateral pleural effusions greater on the right than  on the left. These appear stable. The endotracheal tube tip projects approximately 3.3 cm above the carina. The esophagogastric tube tip projects below the inferior margin of the image. The heart is normal in size. The pulmonary vascularity is indistinct. There is calcification in the wall of the aortic arch. There is moderate dextrocurvature centered in the lower thoracic spine. IMPRESSION: Persistent bilateral pleural effusions and bibasilar atelectasis or infiltrate. Bilateral  interstitial edema or infiltrate is slightly less conspicuous. The pulmonary vascularity is less engorged as well. The support tubes are in reasonable position. Electronically Signed   By: David  [place] M.D.   On: 07/18/2016 07:17   Dg Chest Port 1 View  Result Date: 07/17/2016 CLINICAL DATA:  Acute respiratory failure with hypoxemia. EXAM: PORTABLE CHEST 1 VIEW COMPARISON:  07/16/2016 FINDINGS: 0513 hours. Endotracheal tube tip is 2.8 cm above the base the carina. The NG tube passes into the stomach although the distal tip position is not included on the film. Bilateral interstitial and basilar airspace opacity persists. Small bilateral pleural effusions are again noted. Telemetry leads overlie the chest. IMPRESSION: No substantial change in exam. Electronically Signed   By: Kennith Center M.D.   On: 07/17/2016 07:19   Dg Chest Port 1 View  Result Date: 07/16/2016 CLINICAL DATA:  Endotracheal tube placement. EXAM: PORTABLE CHEST 1 VIEW COMPARISON:  Radiographs October 14, 2016. FINDINGS: Stable cardiomediastinal silhouette. Atherosclerosis of thoracic aorta is noted. Endotracheal and nasogastric tubes are in grossly good position. Stable bilateral basilar lung opacities are noted concerning for edema with associated pleural effusions. No pneumothorax is noted. Bony thorax is unremarkable. IMPRESSION: Endotracheal and nasogastric tubes in grossly good position. Stable bilateral basilar edema and pleural effusions are noted. Aortic atherosclerosis. Electronically Signed   By: Lupita Raider, M.D.   On: 07/16/2016 09:31   Dg Chest Port 1 View  Result Date: 07/15/2016 CLINICAL DATA:  Intubation, essential hypertension, COPD, CHF, stage III chronic kidney disease, abdominal aortic aneurysm, atrial fibrillation EXAM: PORTABLE CHEST 1 VIEW COMPARISON:  Portable exam 0537 hours compared to 07/14/2016 FINDINGS: Tip of endotracheal tube within 6 mm of carina, recommend withdrawal 1-2 cm. Enlargement of cardiac  silhouette. Atherosclerotic calcification aorta. Diffuse BILATERAL pulmonary infiltrates again identified question edema versus pneumonia. Bibasilar effusions. No pneumothorax. Osseous demineralization. IMPRESSION: Tip of endotracheal tube within 6 mm of carina; recommend withdrawal 1-2 cm. Persistent diffuse BILATERAL airspace infiltrates question edema versus infection with associated bibasilar effusions. Findings called to patient's nurse Dois Davenport RN on [facility] on 07/15/2016 at 0843 hours. Electronically Signed   By: Ulyses Southward M.D.   On: 07/15/2016 08:45   Dg Chest Port 1 View  Result Date: 07/14/2016 CLINICAL DATA:  Intubation . EXAM: PORTABLE CHEST 1 VIEW FINDINGS: Endotracheal tube 1 cm above the lower portion of the carina, withdrawal of approximately 2 cm suggested. NG tube noted with tip below left hemidiaphragm. Heart size normal. Diffuse multifocal bilateral pulmonary infiltrates and/or edema are again noted without interim improvement. Bilateral pleural effusions noted. No pneumothorax. IMPRESSION: 1. Endotracheal tube 1 cm above the lower portion of the carina. Withdrawal of approximately 2 cm suggested. 2. NG tube noted with tip below left hemidiaphragm. 3. Persistent multifocal pulmonary infiltrates are and/or edema again noted. Bilateral pleural effusions again noted. These results will be called to the ordering clinician or representative by the Radiologist Assistant, and communication documented in the PACS or zVision Dashboard. Electronically Signed   By: Maisie Fus  Register   On: 07/14/2016 12:42   Dg Chest  Port 1 View  Result Date: 07/14/2016 CLINICAL DATA:  82 year old female with acute respiratory failure. EXAM: PORTABLE CHEST 1 VIEW COMPARISON:  Chest radiograph dated 07/13/2016 FINDINGS: There has been interval development of a small-to-moderate right pleural effusion, new from prior study. Stable appearing left-sided pleural effusion. There is persistent vascular congestion and  interstitial edema. Superimposed pneumonia is not excluded. There is no pneumothorax. Stable mild cardiomegaly. No acute osseous pathology. IMPRESSION: Congestive heart failure with pulmonary edema and interval development of a small to moderate right pleural effusion. Clinical correlation and follow-up recommended. Electronically Signed   By: Elgie Collard M.D.   On: 07/14/2016 01:19   Dg Chest Port 1 View  Result Date: 07/13/2016 CLINICAL DATA:  Congestive heart failure EXAM: PORTABLE CHEST 1 VIEW COMPARISON:  July 12, 2016 FINDINGS: There is widespread interstitial edema with left pleural effusion. There is patchy airspace consolidation in the medial bases bilaterally. There is cardiomegaly with pulmonary venous hypertension. There is atherosclerotic calcification in the aorta. There is no new opacity evident. IMPRESSION: Findings indicative of congestive heart failure without appreciable change from 1 day prior. Stable cardiac silhouette. There is aortic atherosclerosis. Electronically Signed   By: Bretta Bang III M.D.   On: 07/13/2016 07:27   Dg Chest Port 1 View  Result Date: 07/12/2016 CLINICAL DATA:  Acute respiratory failure, history of CHF, COPD, chronic renal insufficiency EXAM: PORTABLE CHEST 1 VIEW COMPARISON:  Portable chest x-ray of July 11, 2016 FINDINGS: The lungs are adequately inflated. The left hemidiaphragm remains obscured. There is some obscuration of the right hemidiaphragm now. The pulmonary interstitial markings are increased bilaterally. There are confluent alveolar opacities in the left upper lobe. The heart is normal in size. There is calcification in the wall of the aortic arch. The trachea is midline. The bony thorax exhibits no acute abnormality. IMPRESSION: Slight interval worsening of pulmonary interstitial edema and bilateral pleural effusions. Persistent pulmonary vascular engorgement. Aortic atherosclerosis. Electronically Signed   By: David  [place] M.D.    On: 07/12/2016 07:12   Dg Chest Port 1 View  Result Date: 07/11/2016 CLINICAL DATA:  Acute on chronic respiratory failure. History of COPD. Worsening shortness of breath. EXAM: PORTABLE CHEST 1 VIEW COMPARISON:  Single-view of the chest 07/10/2016 and 06/14/2016. FINDINGS: Left worse than right airspace disease persists appearing aeration improved improved on the right since the most recent exam. Left pleural effusion is again identified. Heart size is upper normal. Aortic atherosclerosis is seen. IMPRESSION: Extensive bilateral airspace disease shows improvement on the right since the most recent exam. No new abnormality. Left pleural effusion. Electronically Signed   By: Drusilla Kanner M.D.   On: 07/11/2016 19:34   Dg Chest Portable 1 View  Result Date: 07/10/2016 CLINICAL DATA:  Shortness of breath and bilateral lower extremity edema. EXAM: PORTABLE CHEST 1 VIEW COMPARISON:  Single-view of the chest 06/14/2016 and 06/12/2016. FINDINGS: Bilateral airspace disease seen on the most recent examination has worsened. Small to moderate left pleural effusion is unchanged. There is a small right pleural effusion which appears increased. Atherosclerosis is noted. No pneumothorax. IMPRESSION: Worsened bilateral airspace disease compatible with increased pulmonary edema and/or pneumonia. New small right pleural effusion. Small to moderate left pleural effusion is unchanged since the most recent study. Atherosclerosis. Electronically Signed   By: Drusilla Kanner M.D.   On: 07/10/2016 20:09   Dg Abd Portable 1v  Result Date: 07/21/2016 CLINICAL DATA:  OG tube placement EXAM: PORTABLE ABDOMEN - 1 VIEW COMPARISON:  KUB of  07/14/2016, CT of the abdomen pelvis of 07/13/2016 FINDINGS: The OG tube tip overlies the region of the proximal body of the stomach. There is little change in opacity at both lung bases most consistent with bilateral pleural effusions and basilar atelectasis. The bowel gas pattern is nonspecific.  Right hip replacement is present. As noted on prior CT, and abdominal aortic aneurysm is noted of 4.2 cm in diameter. IMPRESSION: 1. OG tube tip overlies the region of the midbody of the stomach. 2. Probable bilateral pleural effusions and basilar atelectasis. 3. Distal abdominal aortic aneurysm of approximately 4.2 cm. Electronically Signed   By: Dwyane Dee M.D.   On: 07/21/2016 08:51   Dg Abd Portable 1v  Result Date: 07/14/2016 CLINICAL DATA:  Orogastric tube placement EXAM: PORTABLE ABDOMEN - 1 VIEW COMPARISON:  Portable exam 1221 hours compared to CT abdomen and pelvis 07/13/2013 FINDINGS: Tip of orogastric tube projects over proximal to mid stomach. Visualized bowel gas pattern normal. Aortic atherosclerotic calcification extending and iliac arteries. LEFT lower lobe atelectasis versus consolidation with associated bibasilar effusions. Bones demineralized. IMPRESSION: Tip of orogastric tube projects over proximal to mid stomach. Bibasilar effusions with atelectasis versus consolidation in LEFT lower lobe. Aortic atherosclerosis. Electronically Signed   By: Ulyses Southward M.D.   On: 07/14/2016 12:45     CBC  Recent Labs Lab 07/18/16 0233 07/18/16 0816 07/19/16 0341 07/20/16 0335 07/21/16 0345  WBC 12.8* 12.5* 12.4* 12.0* 11.0*  HGB 9.5* 9.7* 9.5* 8.9* 7.2*  HCT 30.3* 30.5* 29.7* 28.7* 23.1*  PLT 184 185 199 171 128*  MCV 88.6 87.4 87.1 88.6 88.8  MCH 27.8 27.8 27.9 27.5 27.7  MCHC 31.4 31.8 32.0 31.0 31.2  RDW 15.5 15.6* 15.3 15.5 16.0*    Chemistries   Recent Labs Lab 07/19/16 0341 07/19/16 1614 07/20/16 0335 07/20/16 1704 07/21/16 0345  NA 133* 135 136 137 136  K 2.9* 4.0 3.8 4.0 4.0  CL 82* 90* 99* 100* 102  CO2 35* 32 30 27 27   GLUCOSE 118* 178* 123* 111* 152*  BUN 133* 104* 48* 36* 47*  CREATININE 2.95* 2.19* 1.19* 1.04* 1.58*  CALCIUM 8.8* 8.6* 8.3* 8.6* 8.1*  MG 2.0  --  2.2  --  2.2  AST  --   --   --   --  14*  ALT  --   --   --   --  16  ALKPHOS  --   --   --    --  49  BILITOT  --   --   --   --  0.6   ------------------------------------------------------------------------------------------------------------------ estimated creatinine clearance is 20.7 mL/min (by C-G formula based on SCr of 1.58 mg/dL (H)). ------------------------------------------------------------------------------------------------------------------ No results for input(s): HGBA1C in the last 72 hours. ------------------------------------------------------------------------------------------------------------------ No results for input(s): CHOL, HDL, LDLCALC, TRIG, CHOLHDL, LDLDIRECT in the last 72 hours. ------------------------------------------------------------------------------------------------------------------ No results for input(s): TSH, T4TOTAL, T3FREE, THYROIDAB in the last 72 hours.  Invalid input(s): FREET3 ------------------------------------------------------------------------------------------------------------------ No results for input(s): VITAMINB12, FOLATE, FERRITIN, TIBC, IRON, RETICCTPCT in the last 72 hours.  Coagulation profile No results for input(s): INR, PROTIME in the last 168 hours.  No results for input(s): DDIMER in the last 72 hours.  Cardiac Enzymes No results for input(s): CKMB, TROPONINI, MYOGLOBIN in the last 168 hours.  Invalid input(s): CK ------------------------------------------------------------------------------------------------------------------ Invalid input(s): POCBNP   CBG:  Recent Labs Lab 07/20/16 2023 07/21/16 0014 07/21/16 0341 07/21/16 0720 07/21/16 1204  GLUCAP 161* 126* 154* 109* 158*  Studies: No results found.    No results found for: HGBA1C Lab Results  Component Value Date   LDLCALC (H) 05/06/2007    145        Total Cholesterol/HDL:CHD Risk Coronary Heart Disease Risk Table                     Men   Women  1/2 Average Risk   3.4   3.3   CREATININE 1.58 (H) 07/21/2016        Scheduled Meds:  Continuous Infusions: . fentaNYL infusion INTRAVENOUS 75 mcg/hr (07/24/16 0852)     LOS: 14 days    Time spent: >30 MINS    [facility]  [facility] Pager [phone]. If 7PM-7AM, please contact night-coverage at [email or web address], password [email or web address] 07/24/2016, 9:57 AM  LOS: 14 days

## 2016-07-24 NOTE — Progress Notes (Signed)
Wasted 10mL of Fentanyl in Pyxis A sink with Noreene LarssonJill, Consulting civil engineerCharge RN.

## 2016-07-25 DIAGNOSIS — J9600 Acute respiratory failure, unspecified whether with hypoxia or hypercapnia: Secondary | ICD-10-CM

## 2016-07-25 NOTE — Progress Notes (Signed)
Nutrition Brief Note  Chart reviewed. Pt now transitioning to comfort care.  No further nutrition interventions warranted at this time.  Please re-consult as needed.   Rashae Rother A. Amaura Authier, RD, LDN, CDE Pager: [phone] After hours Pager: [phone]  

## 2016-07-25 NOTE — Consult Note (Signed)
   [facility]   07/25/2016  Tandy Gawlsie H Bye 04-12-1934 [id number]    [facility]. Chart reviewed. [facility] not appropriate at this time. Noted Mrs.Abigail Miyamotohacker is transitioning to comfort care.    Raiford NobleAtika Malyn Aytes, MSN-Ed, RN,BSN [facility] Hospital Liaison [phone]

## 2016-07-25 NOTE — Progress Notes (Signed)
[facility] PROGRESS NOTE  Sydney Jacobs UJW:[id number] DOB: May 24, 1934 DOA: 07/10/2016   PCP: Colette Ribas, MD     Assessment/Plan: Principal Problem:   Acute on chronic diastolic congestive heart failure (HCC) Active Problems:   Elevated troponin   Chronic kidney disease (CKD), stage IV (severe) (HCC)   Essential hypertension   COPD (chronic obstructive pulmonary disease) (HCC)   Retroperitoneal hematoma in 8/17   PAF (paroxysmal atrial fibrillation) (HCC)   AAA (abdominal aortic aneurysm) (HCC)   Chronic respiratory failure (HCC)   Normocytic anemia   Hypokalemia   Atrial fibrillation with RVR (HCC)   Acute respiratory failure with hypoxemia (HCC)   Acute pulmonary edema (HCC)   82 [facility], with multiple medical problems including chronic respiratory failure on the basis of dCHF and COPD on home O2, CKD III,  pulmonary nodule, PAF, chest pain with recent cath 05/2016 without significant CAD, retroperitoneal bleed s/p recent cath .  Presented 9/3 with worsening SOB.  She was admitted by [facility] to [facility] on bipap with acute on chronic respiratory failure r/t acute on chronic CHF +/- COPD.  On 9/4 she remained extremely SOB, failed brief trial off bipap and PCCM consulted. Seen in cardiology clinic 07/06/16 by PA Dunn with increased SOB. Lasix changed to torsemide 20mg  daily  Assessment and plan Acute on chronic respiratory failure with hypoxemia due to decompensated diastolic heart failure with underlying COPD  She failed Bipap and required intubation 9/07.  She developed progressive renal failure and nephrology consulted.  Tried on CRRT >> stopped due to catheter complications.  Pt opted to not have dialysis catheter replaced.  Instead wanted to focus on comfort measures.  DNR , she was extubated 9/14. Fentanyl, Ativan for pain control and anxiety [facility]  assumed care from 9/16 and PCCM signed off. Continue to titrate fentanyl drip  as per protocol ordered for comfort Discussed with son extensively yesterday regarding why patient is still making urine. This is directly related to her blood pressure which is still holding up. There will not be any reversal of care as the patient is clinically declining Continue current measures  DVT prophylaxsis none  Code Status:  DO NOT RESUSCITATE    Family Communication: Discussed in detail with the patient, all imaging results, lab results explained to the patient   Disposition Plan:  Anticipate hospital death      Consultants:  Critical care  Cardiology  Procedures:  None  Antibiotics: Anti-infectives    None         HPI/Subjective: Patient is afebrile, blood pressure is still 154 systolic, nonverbal  Objective: Vitals:   07/22/16 1104 07/23/16 0500 07/24/16 0549 07/25/16 0533  BP: (!) 141/58 (!) 133/52 (!) 153/59 (!) 154/58  Pulse: 85 77 71 74  Resp: (!) 22 12 15 16   Temp: 98.2 F (36.8 C) 98.2 F (36.8 C) 97.4 F (36.3 C) 97.7 F (36.5 C)  TempSrc: Oral  Axillary Axillary  SpO2: 92% 93% 94% 94%  Weight:      Height:        Intake/Output Summary (Last 24 hours) at 07/25/16 1018 Last data filed at 07/25/16 0534  Gross per 24 hour  Intake            63.13 ml  Output              351 ml  Net          -287.87  ml      General: somnolent Neuro: opens eyes with stimulation HEENT: no stridor Cardiac: irregular, 2/6 SM Chest: basilar crackles b/l Abd: soft, non tender Ext: 1+ edema Skin: no rashes    Data Reviewed: I have personally reviewed following labs and imaging studies  Micro Results Recent Results (from the past 240 hour(s))  Culture, respiratory (NON-Expectorated)     Status: None   Collection Time: 07/17/16  8:30 AM  Result Value Ref Range Status   Specimen Description TRACHEAL ASPIRATE  Final   Special Requests NONE  Final   Gram Stain   Final    MODERATE WBC PRESENT, PREDOMINANTLY PMN RARE SQUAMOUS EPITHELIAL CELLS  PRESENT MODERATE GRAM POSITIVE COCCI IN PAIRS FEW GRAM VARIABLE ROD    Culture Consistent with normal respiratory flora.  Final   Report Status 07/19/2016 FINAL  Final    Radiology Reports Ct Abdomen Pelvis Wo Contrast  Result Date: 07/13/2016 CLINICAL DATA:  Anemia. Rule out retroperitoneal hemorrhage. Aneurysm. EXAM: CT ABDOMEN AND PELVIS WITHOUT CONTRAST TECHNIQUE: Multidetector CT imaging of the abdomen and pelvis was performed following the standard protocol without IV contrast. COMPARISON:  CT abdomen pelvis 05/28/2016 FINDINGS: Lower chest: Moderately large bilateral pleural effusions have increased in size since the prior CT. There is compressive atelectasis in both lung bases. Left ventricular enlargement. Calcification of the mitral annulus again noted. Hepatobiliary: Liver gallbladder and bile ducts within normal limits. Pancreas: Negative Spleen: Negative Adrenals/Urinary Tract: Large right upper pole renal cyst measuring 9 x 11 cm. This displaces the kidney inferiorly. No renal hydronephrosis. Atrophic left kidney likely due to renovascular disease. Left renal cyst 4 cm. Sub cm hyperdense nodule left upper pole unchanged. Urinary bladder decompressed with Foley catheter Stomach/Bowel: Stomach and duodenum normal. Negative for bowel obstruction. No bowel mass or edema identified. Sigmoid diverticulosis. Negative for diverticulitis. Vascular/Lymphatic: Atherosclerotic calcifications are present diffusely. Infrarenal abdominal aortic aneurysm measures 3.9 x 4.2 cm. Intimal calcifications remain displaced into the lumen unchanged. No retroperitoneal hemorrhage around the aneurysm. No evidence of aneurysm leak. Atherosclerotic calcification extends throughout the iliac arteries bilaterally. Reproductive: Hysterectomy.  No pelvic mass. Other: Right inguinal hematoma has improved now measuring 2.4 cm. Extraperitoneal hemorrhage in the right iliac fossa has nearly completely resolved. No new area of  hemorrhage. Musculoskeletal: Lumbar disc and facet degeneration. No acute skeletal abnormality right hip replacement. Mild levoscoliosis. IMPRESSION: Near complete resolution of right inguinal hematoma and extraperitoneal hematoma on the right. No new area of hemorrhage. Atherosclerotic infrarenal abdominal aortic aneurysm measures 3.9 x 4.2 cm without evidence of leak. Moderately large bilateral pleural effusions with bibasilar atelectasis. Atrophic left kidney. Bilateral renal cyst. Large right renal cyst. Electronically Signed   By: Marlan Palau M.D.   On: 07/13/2016 11:21   Dg Chest Port 1 View  Result Date: 07/21/2016 CLINICAL DATA:  Respiratory failure, CHF, history of COPD, intubated patient. EXAM: PORTABLE CHEST 1 VIEW COMPARISON:  Portable chest x-ray of July 20, 2016 FINDINGS: The lungs are adequately inflated. Persistent bibasilar alveolar opacities are present with obscuration of the hemidiaphragms. The bilateral pleural effusions greater on the right. Hazy interstitial density in the right upper lobe is stable. The heart is normal in size. The pulmonary vascularity is prominent centrally. There is calcification in the wall of the aortic arch. The endotracheal tube tip lies 3.7 cm above the carina. The esophagogastric tube tip appears to terminate in the mid esophagus which reflects a change since yesterday's study. The right internal jugular Cordis sheath tip projects  over the proximal third of the SVC. IMPRESSION: Persistent bibasilar atelectasis or pneumonia with bilateral pleural effusions. Mild interstitial prominence in the right upper lobe persists. The endotracheal tube is in reasonable position. Aortic atherosclerosis. The esophagogastric tube has been apparently withdrawn such that its tip overlies the mid esophagus. Advancement by approximately 30 cm is recommended. Electronically Signed   By: David  SwazilandJordan M.D.   On: 07/21/2016 07:20   Dg Chest Port 1 View  Result Date:  07/20/2016 CLINICAL DATA:  Hemodialysis catheter not flushing well EXAM: PORTABLE CHEST 1 VIEW COMPARISON:  07/19/2016 FINDINGS: Endotracheal tube is 3.7 cm above the carina. Enteric tube extends into the stomach and beyond the inferior edge of the image. Right jugular central line extends into the SVC. No pneumothorax is evident. Effusions are present in both bases. Mild basilar lung opacities persist. Partial clearance of right upper lobe patchy airspace opacity. IMPRESSION: 1. Satisfactorily positioned support equipment 2. No pneumothorax. 3. Persistent pleural effusions. Partial clearance of right upper lobe patchy airspace opacity. Electronically Signed   By: Ellery Plunkaniel R Mitchell M.D.   On: 07/20/2016 05:11   Dg Chest Port 1 View  Result Date: 07/19/2016 CLINICAL DATA:  Status post central line placement. History of atrial fibrillation, COPD, CHF, CVA. EXAM: PORTABLE CHEST 1 VIEW COMPARISON:  Portable chest x-ray of July 19, 2016 at 5:07 a.m. FINDINGS: There has been interval placement of a right internal jugular Cordis sheath. No postprocedure pneumothorax is observed. Interval worsening of interstitial infiltrates throughout the right lung. Slight interval worsening of left upper lobe and left basilar infiltrate as well. The heart is normal in size. The pulmonary vascularity is prominent centrally but there is no definite cephalization. There is calcification in the wall of the aortic arch. The endotracheal tube tip measures 4.7 cm above the carina. The esophagogastric tube tip projects in the proximal gastric body with the proximal port at the level of the GE junction. IMPRESSION: 1. No postprocedure complication following placement of the right internal jugular Cordis sheath. 2. Advancement of the nasogastric tube by 10-20 cm is recommended. The endotracheal tube appears to be in appropriate position. 3. Worsening of bilateral interstitial and alveolar opacities consistent with progressive pneumonia.  Minimal if any CHF. 4. Aortic atherosclerosis. Electronically Signed   By: David  [place]azilandJordan M.D.   On: 07/19/2016 12:11   Dg Chest Port 1 View  Result Date: 07/19/2016 CLINICAL DATA:  Intubated patient.  Respiratory distress. EXAM: PORTABLE CHEST 1 VIEW COMPARISON:  07/18/2016 and multiple prior studies. FINDINGS: The right basilar opacity obscures the hemidiaphragm consistent with a moderate-sized pleural effusion. Opacity at the left lung base is also noted consistent small to moderate pleural effusion. There is vascular congestion with bilateral interstitial thickening. Hazy airspace opacities noted in the right perihilar and left upper lobe. No pneumothorax. Endotracheal tube tip projects 3 cm above the Carina, stable. Nasal/orogastric tube passes below the diaphragm into the stomach, also unchanged. IMPRESSION: 1. Persistent bilateral pleural effusions, vascular congestion and interstitial thickening. Patchy areas of airspace opacity noted, which show a variable distribution over the course of the last several chest radiographs. Findings are most consistent with persistent asymmetric pulmonary edem. Overall, no significant change from the most recent prior study. Electronically Signed   By: Amie Portlandavid  Ormond M.D.   On: 07/19/2016 07:25   Dg Chest Port 1 View  Result Date: 07/18/2016 CLINICAL DATA:  Acute respiratory failure with hypoxia EXAM: PORTABLE CHEST 1 VIEW COMPARISON:  Portable chest x-ray of July 17, 2016  FINDINGS: The lungs are adequately inflated. The interstitial markings remain increased. Density over the right pulmonary apex is more conspicuous but is in part due to overlying support apparatus. There are bilateral pleural effusions greater on the right than on the left. These appear stable. The endotracheal tube tip projects approximately 3.3 cm above the carina. The esophagogastric tube tip projects below the inferior margin of the image. The heart is normal in size. The pulmonary  vascularity is indistinct. There is calcification in the wall of the aortic arch. There is moderate dextrocurvature centered in the lower thoracic spine. IMPRESSION: Persistent bilateral pleural effusions and bibasilar atelectasis or infiltrate. Bilateral interstitial edema or infiltrate is slightly less conspicuous. The pulmonary vascularity is less engorged as well. The support tubes are in reasonable position. Electronically Signed   By: David  [place] M.D.   On: 07/18/2016 07:17   Dg Chest Port 1 View  Result Date: 07/17/2016 CLINICAL DATA:  Acute respiratory failure with hypoxemia. EXAM: PORTABLE CHEST 1 VIEW COMPARISON:  07/16/2016 FINDINGS: 0513 hours. Endotracheal tube tip is 2.8 cm above the base the carina. The NG tube passes into the stomach although the distal tip position is not included on the film. Bilateral interstitial and basilar airspace opacity persists. Small bilateral pleural effusions are again noted. Telemetry leads overlie the chest. IMPRESSION: No substantial change in exam. Electronically Signed   By: Kennith Center M.D.   On: 07/17/2016 07:19   Dg Chest Port 1 View  Result Date: 07/16/2016 CLINICAL DATA:  Endotracheal tube placement. EXAM: PORTABLE CHEST 1 VIEW COMPARISON:  Radiographs October 14, 2016. FINDINGS: Stable cardiomediastinal silhouette. Atherosclerosis of thoracic aorta is noted. Endotracheal and nasogastric tubes are in grossly good position. Stable bilateral basilar lung opacities are noted concerning for edema with associated pleural effusions. No pneumothorax is noted. Bony thorax is unremarkable. IMPRESSION: Endotracheal and nasogastric tubes in grossly good position. Stable bilateral basilar edema and pleural effusions are noted. Aortic atherosclerosis. Electronically Signed   By: Lupita Raider, M.D.   On: 07/16/2016 09:31   Dg Chest Port 1 View  Result Date: 07/15/2016 CLINICAL DATA:  Intubation, essential hypertension, COPD, CHF, stage III chronic kidney  disease, abdominal aortic aneurysm, atrial fibrillation EXAM: PORTABLE CHEST 1 VIEW COMPARISON:  Portable exam 0537 hours compared to 07/14/2016 FINDINGS: Tip of endotracheal tube within 6 mm of carina, recommend withdrawal 1-2 cm. Enlargement of cardiac silhouette. Atherosclerotic calcification aorta. Diffuse BILATERAL pulmonary infiltrates again identified question edema versus pneumonia. Bibasilar effusions. No pneumothorax. Osseous demineralization. IMPRESSION: Tip of endotracheal tube within 6 mm of carina; recommend withdrawal 1-2 cm. Persistent diffuse BILATERAL airspace infiltrates question edema versus infection with associated bibasilar effusions. Findings called to patient's nurse Dois Davenport RN on [facility] on 07/15/2016 at 0843 hours. Electronically Signed   By: Ulyses Southward M.D.   On: 07/15/2016 08:45   Dg Chest Port 1 View  Result Date: 07/14/2016 CLINICAL DATA:  Intubation . EXAM: PORTABLE CHEST 1 VIEW FINDINGS: Endotracheal tube 1 cm above the lower portion of the carina, withdrawal of approximately 2 cm suggested. NG tube noted with tip below left hemidiaphragm. Heart size normal. Diffuse multifocal bilateral pulmonary infiltrates and/or edema are again noted without interim improvement. Bilateral pleural effusions noted. No pneumothorax. IMPRESSION: 1. Endotracheal tube 1 cm above the lower portion of the carina. Withdrawal of approximately 2 cm suggested. 2. NG tube noted with tip below left hemidiaphragm. 3. Persistent multifocal pulmonary infiltrates are and/or edema again noted. Bilateral pleural effusions again noted. These results  will be called to the ordering clinician or representative by the Radiologist Assistant, and communication documented in the PACS or zVision Dashboard. Electronically Signed   By: Maisie Fus  Register   On: 07/14/2016 12:42   Dg Chest Port 1 View  Result Date: 07/14/2016 CLINICAL DATA:  82 year old female with acute respiratory failure. EXAM: PORTABLE CHEST 1 VIEW  COMPARISON:  Chest radiograph dated 07/13/2016 FINDINGS: There has been interval development of a small-to-moderate right pleural effusion, new from prior study. Stable appearing left-sided pleural effusion. There is persistent vascular congestion and interstitial edema. Superimposed pneumonia is not excluded. There is no pneumothorax. Stable mild cardiomegaly. No acute osseous pathology. IMPRESSION: Congestive heart failure with pulmonary edema and interval development of a small to moderate right pleural effusion. Clinical correlation and follow-up recommended. Electronically Signed   By: Elgie Collard M.D.   On: 07/14/2016 01:19   Dg Chest Port 1 View  Result Date: 07/13/2016 CLINICAL DATA:  Congestive heart failure EXAM: PORTABLE CHEST 1 VIEW COMPARISON:  July 12, 2016 FINDINGS: There is widespread interstitial edema with left pleural effusion. There is patchy airspace consolidation in the medial bases bilaterally. There is cardiomegaly with pulmonary venous hypertension. There is atherosclerotic calcification in the aorta. There is no new opacity evident. IMPRESSION: Findings indicative of congestive heart failure without appreciable change from 1 day prior. Stable cardiac silhouette. There is aortic atherosclerosis. Electronically Signed   By: Bretta Bang III M.D.   On: 07/13/2016 07:27   Dg Chest Port 1 View  Result Date: 07/12/2016 CLINICAL DATA:  Acute respiratory failure, history of CHF, COPD, chronic renal insufficiency EXAM: PORTABLE CHEST 1 VIEW COMPARISON:  Portable chest x-ray of July 11, 2016 FINDINGS: The lungs are adequately inflated. The left hemidiaphragm remains obscured. There is some obscuration of the right hemidiaphragm now. The pulmonary interstitial markings are increased bilaterally. There are confluent alveolar opacities in the left upper lobe. The heart is normal in size. There is calcification in the wall of the aortic arch. The trachea is midline. The bony  thorax exhibits no acute abnormality. IMPRESSION: Slight interval worsening of pulmonary interstitial edema and bilateral pleural effusions. Persistent pulmonary vascular engorgement. Aortic atherosclerosis. Electronically Signed   By: David  [place]iland M.D.   On: 07/12/2016 07:12   Dg Chest Port 1 View  Result Date: 07/11/2016 CLINICAL DATA:  Acute on chronic respiratory failure. History of COPD. Worsening shortness of breath. EXAM: PORTABLE CHEST 1 VIEW COMPARISON:  Single-view of the chest 07/10/2016 and 06/14/2016. FINDINGS: Left worse than right airspace disease persists appearing aeration improved improved on the right since the most recent exam. Left pleural effusion is again identified. Heart size is upper normal. Aortic atherosclerosis is seen. IMPRESSION: Extensive bilateral airspace disease shows improvement on the right since the most recent exam. No new abnormality. Left pleural effusion. Electronically Signed   By: Drusilla Kanner M.D.   On: 07/11/2016 19:34   Dg Chest Portable 1 View  Result Date: 07/10/2016 CLINICAL DATA:  Shortness of breath and bilateral lower extremity edema. EXAM: PORTABLE CHEST 1 VIEW COMPARISON:  Single-view of the chest 06/14/2016 and 06/12/2016. FINDINGS: Bilateral airspace disease seen on the most recent examination has worsened. Small to moderate left pleural effusion is unchanged. There is a small right pleural effusion which appears increased. Atherosclerosis is noted. No pneumothorax. IMPRESSION: Worsened bilateral airspace disease compatible with increased pulmonary edema and/or pneumonia. New small right pleural effusion. Small to moderate left pleural effusion is unchanged since the most recent study. Atherosclerosis. Electronically  Signed   By: Drusilla Kanner M.D.   On: 07/10/2016 20:09   Dg Abd Portable 1v  Result Date: 07/21/2016 CLINICAL DATA:  OG tube placement EXAM: PORTABLE ABDOMEN - 1 VIEW COMPARISON:  KUB of 07/14/2016, CT of the abdomen pelvis of  07/13/2016 FINDINGS: The OG tube tip overlies the region of the proximal body of the stomach. There is little change in opacity at both lung bases most consistent with bilateral pleural effusions and basilar atelectasis. The bowel gas pattern is nonspecific. Right hip replacement is present. As noted on prior CT, and abdominal aortic aneurysm is noted of 4.2 cm in diameter. IMPRESSION: 1. OG tube tip overlies the region of the midbody of the stomach. 2. Probable bilateral pleural effusions and basilar atelectasis. 3. Distal abdominal aortic aneurysm of approximately 4.2 cm. Electronically Signed   By: Dwyane Dee M.D.   On: 07/21/2016 08:51   Dg Abd Portable 1v  Result Date: 07/14/2016 CLINICAL DATA:  Orogastric tube placement EXAM: PORTABLE ABDOMEN - 1 VIEW COMPARISON:  Portable exam 1221 hours compared to CT abdomen and pelvis 07/13/2013 FINDINGS: Tip of orogastric tube projects over proximal to mid stomach. Visualized bowel gas pattern normal. Aortic atherosclerotic calcification extending and iliac arteries. LEFT lower lobe atelectasis versus consolidation with associated bibasilar effusions. Bones demineralized. IMPRESSION: Tip of orogastric tube projects over proximal to mid stomach. Bibasilar effusions with atelectasis versus consolidation in LEFT lower lobe. Aortic atherosclerosis. Electronically Signed   By: Ulyses Southward M.D.   On: 07/14/2016 12:45     CBC  Recent Labs Lab 07/19/16 0341 07/20/16 0335 07/21/16 0345  WBC 12.4* 12.0* 11.0*  HGB 9.5* 8.9* 7.2*  HCT 29.7* 28.7* 23.1*  PLT 199 171 128*  MCV 87.1 88.6 88.8  MCH 27.9 27.5 27.7  MCHC 32.0 31.0 31.2  RDW 15.3 15.5 16.0*    Chemistries   Recent Labs Lab 07/19/16 0341 07/19/16 1614 07/20/16 0335 07/20/16 1704 07/21/16 0345  NA 133* 135 136 137 136  K 2.9* 4.0 3.8 4.0 4.0  CL 82* 90* 99* 100* 102  CO2 35* 32 30 27 27   GLUCOSE 118* 178* 123* 111* 152*  BUN 133* 104* 48* 36* 47*  CREATININE 2.95* 2.19* 1.19* 1.04*  1.58*  CALCIUM 8.8* 8.6* 8.3* 8.6* 8.1*  MG 2.0  --  2.2  --  2.2  AST  --   --   --   --  14*  ALT  --   --   --   --  16  ALKPHOS  --   --   --   --  49  BILITOT  --   --   --   --  0.6   ------------------------------------------------------------------------------------------------------------------ estimated creatinine clearance is 20.7 mL/min (by C-G formula based on SCr of 1.58 mg/dL (H)). ------------------------------------------------------------------------------------------------------------------ No results for input(s): HGBA1C in the last 72 hours. ------------------------------------------------------------------------------------------------------------------ No results for input(s): CHOL, HDL, LDLCALC, TRIG, CHOLHDL, LDLDIRECT in the last 72 hours. ------------------------------------------------------------------------------------------------------------------ No results for input(s): TSH, T4TOTAL, T3FREE, THYROIDAB in the last 72 hours.  Invalid input(s): FREET3 ------------------------------------------------------------------------------------------------------------------ No results for input(s): VITAMINB12, FOLATE, FERRITIN, TIBC, IRON, RETICCTPCT in the last 72 hours.  Coagulation profile No results for input(s): INR, PROTIME in the last 168 hours.  No results for input(s): DDIMER in the last 72 hours.  Cardiac Enzymes No results for input(s): CKMB, TROPONINI, MYOGLOBIN in the last 168 hours.  Invalid input(s): CK ------------------------------------------------------------------------------------------------------------------ Invalid input(s): POCBNP   CBG:  Recent Labs Lab 07/20/16 2023  07/21/16 0014 07/21/16 0341 07/21/16 0720 07/21/16 1204  GLUCAP 161* 126* 154* 109* 158*       Studies: No results found.    No results found for: HGBA1C Lab Results  Component Value Date   LDLCALC (H) 05/06/2007    145        Total  Cholesterol/HDL:CHD Risk Coronary Heart Disease Risk Table                     Men   Women  1/2 Average Risk   3.4   3.3   CREATININE 1.58 (H) 07/21/2016       Scheduled Meds:  Continuous Infusions: . fentaNYL infusion INTRAVENOUS 75 mcg/hr (07/24/16 1645)     LOS: 15 days    Time spent: >30 MINS    [facility]  [facility] Pager [phone]. If 7PM-7AM, please contact night-coverage at [email or web address], password [facility] 07/25/2016, 10:18 AM  LOS: 15 days

## 2016-07-25 NOTE — Care Management Important Message (Signed)
Important Message  Patient Details  Name: Sydney Jacobs MRN: [id number] Date of Birth: [birth year]   Medicare Important Message Given:  Yes    Babbie Dondlinger Stefan ChurchBratton 07/25/2016, 11:51 AM

## 2016-07-26 MED ORDER — SCOPOLAMINE 1 MG/3DAYS TD PT72
1.0000 | MEDICATED_PATCH | TRANSDERMAL | Status: DC
Start: 2016-07-26 — End: 2016-07-27
  Administered 2016-07-26: 1.5 mg via TRANSDERMAL
  Filled 2016-07-26: qty 1

## 2016-07-26 NOTE — Progress Notes (Signed)
[facility] PROGRESS NOTE  Sydney Jacobs ZOX:[id number] DOB: [birth year] DOA: 07/10/2016   PCP: Colette Ribas, MD     Assessment/Plan: Principal Problem:   Acute on chronic diastolic congestive heart failure (HCC) Active Problems:   Elevated troponin   Chronic kidney disease (CKD), stage IV (severe) (HCC)   Essential hypertension   COPD (chronic obstructive pulmonary disease) (HCC)   Retroperitoneal hematoma in 8/17   PAF (paroxysmal atrial fibrillation) (HCC)   AAA (abdominal aortic aneurysm) (HCC)   Chronic respiratory failure (HCC)   Normocytic anemia   Hypokalemia   Atrial fibrillation with RVR (HCC)   Acute respiratory failure with hypoxemia (HCC)   Acute pulmonary edema (HCC)    82 [facility], with multiple medical problems including chronic respiratory failure on the basis of dCHF and COPD on home O2, CKD III,  pulmonary nodule, PAF, chest pain with recent cath 05/2016 without significant CAD, retroperitoneal bleed s/p recent cath .  Presented 9/3 with worsening SOB.  She was admitted by [facility] to [facility]U on bipap with acute on chronic respiratory failure r/t acute on chronic CHF +/- COPD.  On 9/4 she remained extremely SOB, failed brief trial off bipap and PCCM consulted. Seen in cardiology clinic 07/06/16 by PA Dunn with increased SOB. Lasix changed to torsemide 20mg  daily   Assessment and plan Acute on chronic respiratory failure with hypoxemia due to decompensated diastolic heart failure with underlying COPD  She failed Bipap and required intubation 9/07.  She developed progressive renal failure and nephrology consulted.  Tried on CRRT >> stopped due to catheter complications.  Pt opted to not have dialysis catheter replaced.  Instead wanted to focus on comfort measures.  DNR , she was extubated 9/14. Fentanyl, Ativan for pain control and anxiety [facility]  assumed care from 9/16 and PCCM signed off. Continue to titrate fentanyl  drip as per protocol ordered for comfort Discussed with family extensively on a daily basis .  She is clinically declining Patient will be started on SL atropine and scopolamine patch for secretions    DVT prophylaxsis none  Code Status:  DO NOT RESUSCITATE    Family Communication: Discussed in detail with the patient, all imaging results, lab results explained to the patient   Disposition Plan:  Anticipate hospital death      Consultants:  Critical care  Cardiology  Procedures:  None  Antibiotics: Anti-infectives    None         HPI/Subjective: Low-grade fever this morning  Objective: Vitals:   07/23/16 0500 07/24/16 0549 07/25/16 0533 07/26/16 0332  BP: (!) 133/52 (!) 153/59 (!) 154/58   Pulse: 77 71 74   Resp: 12 15 16    Temp: 98.2 F (36.8 C) 97.4 F (36.3 C) 97.7 F (36.5 C) 99.2 F (37.3 C)  TempSrc:  Axillary Axillary   SpO2: 93% 94% 94%   Weight:      Height:        Intake/Output Summary (Last 24 hours) at 07/26/16 0936 Last data filed at 07/26/16 0653  Gross per 24 hour  Intake            95.07 ml  Output              250 ml  Net          -154.93 ml      General: somnolent Neuro: opens eyes with stimulation HEENT: no stridor Cardiac: irregular, 2/6 SM  Chest: basilar crackles b/l Abd: soft, non tender Ext: 1+ edema Skin: no rashes    Data Reviewed: I have personally reviewed following labs and imaging studies  Micro Results Recent Results (from the past 240 hour(s))  Culture, respiratory (NON-Expectorated)     Status: None   Collection Time: 07/17/16  8:30 AM  Result Value Ref Range Status   Specimen Description TRACHEAL ASPIRATE  Final   Special Requests NONE  Final   Gram Stain   Final    MODERATE WBC PRESENT, PREDOMINANTLY PMN RARE SQUAMOUS EPITHELIAL CELLS PRESENT MODERATE GRAM POSITIVE COCCI IN PAIRS FEW GRAM VARIABLE ROD    Culture Consistent with normal respiratory flora.  Final   Report Status 07/19/2016  FINAL  Final    Radiology Reports Ct Abdomen Pelvis Wo Contrast  Result Date: 07/13/2016 CLINICAL DATA:  Anemia. Rule out retroperitoneal hemorrhage. Aneurysm. EXAM: CT ABDOMEN AND PELVIS WITHOUT CONTRAST TECHNIQUE: Multidetector CT imaging of the abdomen and pelvis was performed following the standard protocol without IV contrast. COMPARISON:  CT abdomen pelvis 05/28/2016 FINDINGS: Lower chest: Moderately large bilateral pleural effusions have increased in size since the prior CT. There is compressive atelectasis in both lung bases. Left ventricular enlargement. Calcification of the mitral annulus again noted. Hepatobiliary: Liver gallbladder and bile ducts within normal limits. Pancreas: Negative Spleen: Negative Adrenals/Urinary Tract: Large right upper pole renal cyst measuring 9 x 11 cm. This displaces the kidney inferiorly. No renal hydronephrosis. Atrophic left kidney likely due to renovascular disease. Left renal cyst 4 cm. Sub cm hyperdense nodule left upper pole unchanged. Urinary bladder decompressed with Foley catheter Stomach/Bowel: Stomach and duodenum normal. Negative for bowel obstruction. No bowel mass or edema identified. Sigmoid diverticulosis. Negative for diverticulitis. Vascular/Lymphatic: Atherosclerotic calcifications are present diffusely. Infrarenal abdominal aortic aneurysm measures 3.9 x 4.2 cm. Intimal calcifications remain displaced into the lumen unchanged. No retroperitoneal hemorrhage around the aneurysm. No evidence of aneurysm leak. Atherosclerotic calcification extends throughout the iliac arteries bilaterally. Reproductive: Hysterectomy.  No pelvic mass. Other: Right inguinal hematoma has improved now measuring 2.4 cm. Extraperitoneal hemorrhage in the right iliac fossa has nearly completely resolved. No new area of hemorrhage. Musculoskeletal: Lumbar disc and facet degeneration. No acute skeletal abnormality right hip replacement. Mild levoscoliosis. IMPRESSION: Near  complete resolution of right inguinal hematoma and extraperitoneal hematoma on the right. No new area of hemorrhage. Atherosclerotic infrarenal abdominal aortic aneurysm measures 3.9 x 4.2 cm without evidence of leak. Moderately large bilateral pleural effusions with bibasilar atelectasis. Atrophic left kidney. Bilateral renal cyst. Large right renal cyst. Electronically Signed   By: Marlan Palau M.D.   On: 07/13/2016 11:21   Dg Chest Port 1 View  Result Date: 07/21/2016 CLINICAL DATA:  Respiratory failure, CHF, history of COPD, intubated patient. EXAM: PORTABLE CHEST 1 VIEW COMPARISON:  Portable chest x-ray of July 20, 2016 FINDINGS: The lungs are adequately inflated. Persistent bibasilar alveolar opacities are present with obscuration of the hemidiaphragms. The bilateral pleural effusions greater on the right. Hazy interstitial density in the right upper lobe is stable. The heart is normal in size. The pulmonary vascularity is prominent centrally. There is calcification in the wall of the aortic arch. The endotracheal tube tip lies 3.7 cm above the carina. The esophagogastric tube tip appears to terminate in the mid esophagus which reflects a change since yesterday's study. The right internal jugular Cordis sheath tip projects over the proximal third of the SVC. IMPRESSION: Persistent bibasilar atelectasis or pneumonia with bilateral pleural effusions. Mild interstitial prominence  in the right upper lobe persists. The endotracheal tube is in reasonable position. Aortic atherosclerosis. The esophagogastric tube has been apparently withdrawn such that its tip overlies the mid esophagus. Advancement by approximately 30 cm is recommended. Electronically Signed   By: David  [place] M.D.   On: 07/21/2016 07:20   Dg Chest Port 1 View  Result Date: 07/20/2016 CLINICAL DATA:  Hemodialysis catheter not flushing well EXAM: PORTABLE CHEST 1 VIEW COMPARISON:  07/19/2016 FINDINGS: Endotracheal tube is 3.7 cm above  the carina. Enteric tube extends into the stomach and beyond the inferior edge of the image. Right jugular central line extends into the SVC. No pneumothorax is evident. Effusions are present in both bases. Mild basilar lung opacities persist. Partial clearance of right upper lobe patchy airspace opacity. IMPRESSION: 1. Satisfactorily positioned support equipment 2. No pneumothorax. 3. Persistent pleural effusions. Partial clearance of right upper lobe patchy airspace opacity. Electronically Signed   By: Ellery Plunk M.D.   On: 07/20/2016 05:11   Dg Chest Port 1 View  Result Date: 07/19/2016 CLINICAL DATA:  Status post central line placement. History of atrial fibrillation, COPD, CHF, CVA. EXAM: PORTABLE CHEST 1 VIEW COMPARISON:  Portable chest x-ray of July 19, 2016 at 5:07 a.m. FINDINGS: There has been interval placement of a right internal jugular Cordis sheath. No postprocedure pneumothorax is observed. Interval worsening of interstitial infiltrates throughout the right lung. Slight interval worsening of left upper lobe and left basilar infiltrate as well. The heart is normal in size. The pulmonary vascularity is prominent centrally but there is no definite cephalization. There is calcification in the wall of the aortic arch. The endotracheal tube tip measures 4.7 cm above the carina. The esophagogastric tube tip projects in the proximal gastric body with the proximal port at the level of the GE junction. IMPRESSION: 1. No postprocedure complication following placement of the right internal jugular Cordis sheath. 2. Advancement of the nasogastric tube by 10-20 cm is recommended. The endotracheal tube appears to be in appropriate position. 3. Worsening of bilateral interstitial and alveolar opacities consistent with progressive pneumonia. Minimal if any CHF. 4. Aortic atherosclerosis. Electronically Signed   By: David  [place] M.D.   On: 07/19/2016 12:11   Dg Chest Port 1 View  Result Date:  07/19/2016 CLINICAL DATA:  Intubated patient.  Respiratory distress. EXAM: PORTABLE CHEST 1 VIEW COMPARISON:  07/18/2016 and multiple prior studies. FINDINGS: The right basilar opacity obscures the hemidiaphragm consistent with a moderate-sized pleural effusion. Opacity at the left lung base is also noted consistent small to moderate pleural effusion. There is vascular congestion with bilateral interstitial thickening. Hazy airspace opacities noted in the right perihilar and left upper lobe. No pneumothorax. Endotracheal tube tip projects 3 cm above the Carina, stable. Nasal/orogastric tube passes below the diaphragm into the stomach, also unchanged. IMPRESSION: 1. Persistent bilateral pleural effusions, vascular congestion and interstitial thickening. Patchy areas of airspace opacity noted, which show a variable distribution over the course of the last several chest radiographs. Findings are most consistent with persistent asymmetric pulmonary edem. Overall, no significant change from the most recent prior study. Electronically Signed   By: Amie Portland M.D.   On: 07/19/2016 07:25   Dg Chest Port 1 View  Result Date: 07/18/2016 CLINICAL DATA:  Acute respiratory failure with hypoxia EXAM: PORTABLE CHEST 1 VIEW COMPARISON:  Portable chest x-ray of July 17, 2016 FINDINGS: The lungs are adequately inflated. The interstitial markings remain increased. Density over the right pulmonary apex is more conspicuous  but is in part due to overlying support apparatus. There are bilateral pleural effusions greater on the right than on the left. These appear stable. The endotracheal tube tip projects approximately 3.3 cm above the carina. The esophagogastric tube tip projects below the inferior margin of the image. The heart is normal in size. The pulmonary vascularity is indistinct. There is calcification in the wall of the aortic arch. There is moderate dextrocurvature centered in the lower thoracic spine. IMPRESSION:  Persistent bilateral pleural effusions and bibasilar atelectasis or infiltrate. Bilateral interstitial edema or infiltrate is slightly less conspicuous. The pulmonary vascularity is less engorged as well. The support tubes are in reasonable position. Electronically Signed   By: David  [place] M.D.   On: 07/18/2016 07:17   Dg Chest Port 1 View  Result Date: 07/17/2016 CLINICAL DATA:  Acute respiratory failure with hypoxemia. EXAM: PORTABLE CHEST 1 VIEW COMPARISON:  07/16/2016 FINDINGS: 0513 hours. Endotracheal tube tip is 2.8 cm above the base the carina. The NG tube passes into the stomach although the distal tip position is not included on the film. Bilateral interstitial and basilar airspace opacity persists. Small bilateral pleural effusions are again noted. Telemetry leads overlie the chest. IMPRESSION: No substantial change in exam. Electronically Signed   By: Kennith Center M.D.   On: 07/17/2016 07:19   Dg Chest Port 1 View  Result Date: 07/16/2016 CLINICAL DATA:  Endotracheal tube placement. EXAM: PORTABLE CHEST 1 VIEW COMPARISON:  Radiographs October 14, 2016. FINDINGS: Stable cardiomediastinal silhouette. Atherosclerosis of thoracic aorta is noted. Endotracheal and nasogastric tubes are in grossly good position. Stable bilateral basilar lung opacities are noted concerning for edema with associated pleural effusions. No pneumothorax is noted. Bony thorax is unremarkable. IMPRESSION: Endotracheal and nasogastric tubes in grossly good position. Stable bilateral basilar edema and pleural effusions are noted. Aortic atherosclerosis. Electronically Signed   By: Lupita Raider, M.D.   On: 07/16/2016 09:31   Dg Chest Port 1 View  Result Date: 07/15/2016 CLINICAL DATA:  Intubation, essential hypertension, COPD, CHF, stage III chronic kidney disease, abdominal aortic aneurysm, atrial fibrillation EXAM: PORTABLE CHEST 1 VIEW COMPARISON:  Portable exam 0537 hours compared to 07/14/2016 FINDINGS: Tip of  endotracheal tube within 6 mm of carina, recommend withdrawal 1-2 cm. Enlargement of cardiac silhouette. Atherosclerotic calcification aorta. Diffuse BILATERAL pulmonary infiltrates again identified question edema versus pneumonia. Bibasilar effusions. No pneumothorax. Osseous demineralization. IMPRESSION: Tip of endotracheal tube within 6 mm of carina; recommend withdrawal 1-2 cm. Persistent diffuse BILATERAL airspace infiltrates question edema versus infection with associated bibasilar effusions. Findings called to patient's nurse Dois Davenport RN on [facility] on 07/15/2016 at 0843 hours. Electronically Signed   By: Ulyses Southward M.D.   On: 07/15/2016 08:45   Dg Chest Port 1 View  Result Date: 07/14/2016 CLINICAL DATA:  Intubation . EXAM: PORTABLE CHEST 1 VIEW FINDINGS: Endotracheal tube 1 cm above the lower portion of the carina, withdrawal of approximately 2 cm suggested. NG tube noted with tip below left hemidiaphragm. Heart size normal. Diffuse multifocal bilateral pulmonary infiltrates and/or edema are again noted without interim improvement. Bilateral pleural effusions noted. No pneumothorax. IMPRESSION: 1. Endotracheal tube 1 cm above the lower portion of the carina. Withdrawal of approximately 2 cm suggested. 2. NG tube noted with tip below left hemidiaphragm. 3. Persistent multifocal pulmonary infiltrates are and/or edema again noted. Bilateral pleural effusions again noted. These results will be called to the ordering clinician or representative by the Radiologist Assistant, and communication documented in the PACS or  [facility] Dashboard. Electronically Signed   By: Maisie Fus  Register   On: 07/14/2016 12:42   Dg Chest Port 1 View  Result Date: 07/14/2016 CLINICAL DATA:  82 year old female with acute respiratory failure. EXAM: PORTABLE CHEST 1 VIEW COMPARISON:  Chest radiograph dated 07/13/2016 FINDINGS: There has been interval development of a small-to-moderate right pleural effusion, new from prior study. Stable  appearing left-sided pleural effusion. There is persistent vascular congestion and interstitial edema. Superimposed pneumonia is not excluded. There is no pneumothorax. Stable mild cardiomegaly. No acute osseous pathology. IMPRESSION: Congestive heart failure with pulmonary edema and interval development of a small to moderate right pleural effusion. Clinical correlation and follow-up recommended. Electronically Signed   By: Elgie Collard M.D.   On: 07/14/2016 01:19   Dg Chest Port 1 View  Result Date: 07/13/2016 CLINICAL DATA:  Congestive heart failure EXAM: PORTABLE CHEST 1 VIEW COMPARISON:  July 12, 2016 FINDINGS: There is widespread interstitial edema with left pleural effusion. There is patchy airspace consolidation in the medial bases bilaterally. There is cardiomegaly with pulmonary venous hypertension. There is atherosclerotic calcification in the aorta. There is no new opacity evident. IMPRESSION: Findings indicative of congestive heart failure without appreciable change from 1 day prior. Stable cardiac silhouette. There is aortic atherosclerosis. Electronically Signed   By: Bretta Bang III M.D.   On: 07/13/2016 07:27   Dg Chest Port 1 View  Result Date: 07/12/2016 CLINICAL DATA:  Acute respiratory failure, history of CHF, COPD, chronic renal insufficiency EXAM: PORTABLE CHEST 1 VIEW COMPARISON:  Portable chest x-ray of July 11, 2016 FINDINGS: The lungs are adequately inflated. The left hemidiaphragm remains obscured. There is some obscuration of the right hemidiaphragm now. The pulmonary interstitial markings are increased bilaterally. There are confluent alveolar opacities in the left upper lobe. The heart is normal in size. There is calcification in the wall of the aortic arch. The trachea is midline. The bony thorax exhibits no acute abnormality. IMPRESSION: Slight interval worsening of pulmonary interstitial edema and bilateral pleural effusions. Persistent pulmonary vascular  engorgement. Aortic atherosclerosis. Electronically Signed   By: David  [place] M.D.   On: 07/12/2016 07:12   Dg Chest Port 1 View  Result Date: 07/11/2016 CLINICAL DATA:  Acute on chronic respiratory failure. History of COPD. Worsening shortness of breath. EXAM: PORTABLE CHEST 1 VIEW COMPARISON:  Single-view of the chest 07/10/2016 and 06/14/2016. FINDINGS: Left worse than right airspace disease persists appearing aeration improved improved on the right since the most recent exam. Left pleural effusion is again identified. Heart size is upper normal. Aortic atherosclerosis is seen. IMPRESSION: Extensive bilateral airspace disease shows improvement on the right since the most recent exam. No new abnormality. Left pleural effusion. Electronically Signed   By: Drusilla Kanner M.D.   On: 07/11/2016 19:34   Dg Chest Portable 1 View  Result Date: 07/10/2016 CLINICAL DATA:  Shortness of breath and bilateral lower extremity edema. EXAM: PORTABLE CHEST 1 VIEW COMPARISON:  Single-view of the chest 06/14/2016 and 06/12/2016. FINDINGS: Bilateral airspace disease seen on the most recent examination has worsened. Small to moderate left pleural effusion is unchanged. There is a small right pleural effusion which appears increased. Atherosclerosis is noted. No pneumothorax. IMPRESSION: Worsened bilateral airspace disease compatible with increased pulmonary edema and/or pneumonia. New small right pleural effusion. Small to moderate left pleural effusion is unchanged since the most recent study. Atherosclerosis. Electronically Signed   By: Drusilla Kanner M.D.   On: 07/10/2016 20:09   Dg Abd Portable 1v  Result Date: 07/21/2016 CLINICAL DATA:  OG tube placement EXAM: PORTABLE ABDOMEN - 1 VIEW COMPARISON:  KUB of 07/14/2016, CT of the abdomen pelvis of 07/13/2016 FINDINGS: The OG tube tip overlies the region of the proximal body of the stomach. There is little change in opacity at both lung bases most consistent with  bilateral pleural effusions and basilar atelectasis. The bowel gas pattern is nonspecific. Right hip replacement is present. As noted on prior CT, and abdominal aortic aneurysm is noted of 4.2 cm in diameter. IMPRESSION: 1. OG tube tip overlies the region of the midbody of the stomach. 2. Probable bilateral pleural effusions and basilar atelectasis. 3. Distal abdominal aortic aneurysm of approximately 4.2 cm. Electronically Signed   By: Dwyane DeePaul  Barry M.D.   On: 07/21/2016 08:51   Dg Abd Portable 1v  Result Date: 07/14/2016 CLINICAL DATA:  Orogastric tube placement EXAM: PORTABLE ABDOMEN - 1 VIEW COMPARISON:  Portable exam 1221 hours compared to CT abdomen and pelvis 07/13/2013 FINDINGS: Tip of orogastric tube projects over proximal to mid stomach. Visualized bowel gas pattern normal. Aortic atherosclerotic calcification extending and iliac arteries. LEFT lower lobe atelectasis versus consolidation with associated bibasilar effusions. Bones demineralized. IMPRESSION: Tip of orogastric tube projects over proximal to mid stomach. Bibasilar effusions with atelectasis versus consolidation in LEFT lower lobe. Aortic atherosclerosis. Electronically Signed   By: Ulyses SouthwardMark  Boles M.D.   On: 07/14/2016 12:45     CBC  Recent Labs Lab 07/20/16 0335 07/21/16 0345  WBC 12.0* 11.0*  HGB 8.9* 7.2*  HCT 28.7* 23.1*  PLT 171 128*  MCV 88.6 88.8  MCH 27.5 27.7  MCHC 31.0 31.2  RDW 15.5 16.0*    Chemistries   Recent Labs Lab 07/19/16 1614 07/20/16 0335 07/20/16 1704 07/21/16 0345  NA 135 136 137 136  K 4.0 3.8 4.0 4.0  CL 90* 99* 100* 102  CO2 32 30 27 27   GLUCOSE 178* 123* 111* 152*  BUN 104* 48* 36* 47*  CREATININE 2.19* 1.19* 1.04* 1.58*  CALCIUM 8.6* 8.3* 8.6* 8.1*  MG  --  2.2  --  2.2  AST  --   --   --  14*  ALT  --   --   --  16  ALKPHOS  --   --   --  49  BILITOT  --   --   --  0.6    ------------------------------------------------------------------------------------------------------------------ estimated creatinine clearance is 20.7 mL/min (by C-G formula based on SCr of 1.58 mg/dL (H)). ------------------------------------------------------------------------------------------------------------------ No results for input(s): HGBA1C in the last 72 hours. ------------------------------------------------------------------------------------------------------------------ No results for input(s): CHOL, HDL, LDLCALC, TRIG, CHOLHDL, LDLDIRECT in the last 72 hours. ------------------------------------------------------------------------------------------------------------------ No results for input(s): TSH, T4TOTAL, T3FREE, THYROIDAB in the last 72 hours.  Invalid input(s): FREET3 ------------------------------------------------------------------------------------------------------------------ No results for input(s): VITAMINB12, FOLATE, FERRITIN, TIBC, IRON, RETICCTPCT in the last 72 hours.  Coagulation profile No results for input(s): INR, PROTIME in the last 168 hours.  No results for input(s): DDIMER in the last 72 hours.  Cardiac Enzymes No results for input(s): CKMB, TROPONINI, MYOGLOBIN in the last 168 hours.  Invalid input(s): CK ------------------------------------------------------------------------------------------------------------------ Invalid input(s): POCBNP   CBG:  Recent Labs Lab 07/20/16 2023 07/21/16 0014 07/21/16 0341 07/21/16 0720 07/21/16 1204  GLUCAP 161* 126* 154* 109* 158*       Studies: No results found.    No results found for: HGBA1C Lab Results  Component Value Date   LDLCALC (H) 05/06/2007    145  Total Cholesterol/HDL:CHD Risk Coronary Heart Disease Risk Table                     Men   Women  1/2 Average Risk   3.4   3.3   CREATININE 1.58 (H) 07/21/2016       Scheduled Meds:  Continuous Infusions: .  fentaNYL infusion INTRAVENOUS 80 mcg/hr (07/25/16 1923)     LOS: 16 days    Time spent: >30 MINS    [facility]  [facility] Pager [phone]. If 7PM-7AM, please contact night-coverage at [email or web address], password [facility] 07/26/2016, 9:36 AM  LOS: 16 days

## 2016-07-26 NOTE — Progress Notes (Signed)
Fentanyl drip increased to 4712ml/hr as requested by family for c/o pain. Will monitor pain and titrate as required

## 2016-07-27 MED ORDER — GLYCOPYRROLATE 0.2 MG/ML IJ SOLN
0.3000 mg | INTRAMUSCULAR | Status: DC
  Administered 2016-07-27 (×4): 0.3 mg via INTRAVENOUS
  Filled 2016-07-27 (×4): qty 2

## 2016-07-27 MED ORDER — SODIUM CHLORIDE 0.9 % IV SOLN: 10.0000 ug/h | INTRAVENOUS | Status: AC

## 2016-07-27 MED ORDER — HEPARIN SOD (PORK) LOCK FLUSH 10 UNIT/ML IV SOLN
10.0000 [IU] | INTRAVENOUS | Status: AC | PRN
  Administered 2016-07-27: 10 [IU]

## 2016-07-27 MED ORDER — GLYCOPYRROLATE 0.2 MG/ML IJ SOLN: 0.3000 mg | INTRAMUSCULAR | Status: AC

## 2016-07-27 MED ORDER — LORAZEPAM 2 MG/ML IJ SOLN: 1.0000 mg | INTRAMUSCULAR | 0 refills | Status: AC | PRN

## 2016-07-27 MED ORDER — FENTANYL CITRATE (PF) 100 MCG/2ML IJ SOLN: 50.0000 ug | INTRAMUSCULAR | 0 refills | Status: AC | PRN

## 2016-07-27 NOTE — Clinical Social Work Note (Signed)
CSW met with family at bedside to discuss Hospice choice and disposition.  Family is from [facility] and would like [facility] possible.  CSW contacted Maudie Mercury at [facility] who states she has two bed available today and would review patient for possible admission.  CSW sent clinicals and dc summary to Maudie Mercury who has verified all was received.  Kim to follow up with covering CSW regarding admission and transportation. Maudie Mercury was provided covering CSWs contact information.  Covering CSW updated and agreeable to follow-up and assist as necessary.    CSW updated RNCM, RN and MD.  Disposition: [facility], MSW, [place]  [phone]  Licensed Clinical Social Worker

## 2016-07-27 NOTE — Progress Notes (Signed)
Family called nurse to room and verbalized that pt had an episode of difficulty breathing. Current oxygen settings 4l via oxygen canula. Fentanyl increased to 6216ml/hr after consulting with family and confirming their agreement. Waiting for scheduled transport at 1530hrs

## 2016-07-27 NOTE — Progress Notes (Signed)
50mcg iv fentanyl prn administered just before discharge as requested by family. 75ml of fentanyl wasted down the sink from continuous fentanyl drip after discontinuation,RN Erin HearingLauran Breedlove witnessed waste . Pt left unit via ptar for hospice

## 2016-07-27 NOTE — Clinical Social Work Note (Addendum)
CSW has paged MD requesting DNR be signed for transport. Per MD [facility]. RN, patient/family (sons at bedside), and facility notified of patient's DC. RN given number for report. DC packet on patient's chart. Ambulance transport requested for patient for 3:30PM per facility's request. CSW signing off at this time.   Roddie McBryant Kareena Arrambide MSW, [place]CSW, [place], [phone]

## 2016-07-27 NOTE — Discharge Summary (Signed)
Physician Discharge Summary  Sydney Jacobs  ZOX:[id number]  DOB: [birth year]  DOA: 07/10/2016 PCP: Colette Ribas, MD  Admit date: 07/10/2016 Discharge date: 07/27/2016  Admitted From: Transfered from [facility] Disposition:   Residential Hospice    Discharge Condition: Hospice CODE STATUS:, DNR, Comfort Care Diet recommendation: Heart Healthy / Carb Modified / Regular / Dysphagia   Brief/Interim Summary: 82 yo female with sudden onset of dyspnea from acute on chronic diastolic CHF. She has hx of COPD on 4 liters oxygen, CKD IV, AAA with retroperitoneal bleeding 06/23/16, PAF.  Transferred from [facility] to [facility] 9/04.  She was seen by cardiology.  She failed Bipap and required intubation 9/07.  She developed progressive renal failure and nephrology consulted.  Tried on CRRT >> stopped due to catheter complications.  Pt opted to not have dialysis catheter replaced.  Instead wanted to focus on comfort measures.  DNR order placed, and she was extubated 9/14. To be discharge for hospice care with comfort measures.   Subjective:  Patient seen and examined with family at bedside. Family reports patient is comfortable, has been sleeping with no distress. No acute events overnight.  Discharge Diagnoses:    Chronic kidney disease (CKD), stage IV (severe) (HCC)   Essential hypertension   COPD (chronic obstructive pulmonary disease) (HCC)    Retroperitoneal hematoma in 8/17   PAF (paroxysmal atrial fibrillation) (HCC)   AAA (abdominal aortic aneurysm) (HCC)   Chronic respiratory failure (HCC)   Atrial fibrillation with RVR (HCC)   Acute respiratory failure with hypoxemia (HCC)   Acute pulmonary edema (HCC)   Acute on chronic respiratory failure with hypoxemia due to decompensated diastolic heart failure with underlying COPD  She failed Bipap and required intubation 9/07. She developed progressive renal failure and. Tried on CRRT >>stopped due to catheter complications. Pt opted to not have dialysis  catheter replaced.There will not be any reversal of care as the patient is clinically declining - Fentanyl, Ativan for pain control and anxiety - Hospice care and comfort measures  Chronic kidney disease (CKD), stage IV (severe) Pt opted to not have dialysis catheter replaced - Continue comfort measures   Essential hypertension - stable  - Monitor  - Continue comfort measures  COPD (chronic obstructive pulmonary disease)  - Continue O2 as needed  - Continue comfort measures    Discharge Instructions  Discharge Instructions    Discharge instructions    Complete by:  As directed        Medication List    STOP taking these medications   albuterol 108 (90 Base) MCG/ACT inhaler Commonly known as:  PROVENTIL HFA;VENTOLIN HFA   amLODipine 10 MG tablet Commonly known as:  NORVASC   atorvastatin 20 MG tablet Commonly known as:  LIPITOR   cetirizine 10 MG tablet Commonly known as:  ZYRTEC   hydrALAZINE 100 MG tablet Commonly known as:  APRESOLINE   ipratropium 0.02 % nebulizer solution Commonly known as:  ATROVENT   levalbuterol 0.63 MG/3ML nebulizer solution Commonly known as:  XOPENEX   metoprolol 50 MG tablet Commonly known as:  LOPRESSOR   pantoprazole 40 MG tablet Commonly known as:  PROTONIX   predniSONE 10 MG tablet Commonly known as:  DELTASONE   torsemide 20 MG tablet Commonly known as:  DEMADEX   Vitamin D (Ergocalciferol) 50000 units Caps capsule Commonly known as:  DRISDOL     TAKE these medications   fentaNYL 100 MCG/2ML injection Commonly known as:  SUBLIMAZE Inject 1 mL (50 mcg total)  into the vein every hour as needed for moderate pain or severe pain.   fentaNYL 2,500 mcg in sodium chloride 0.9 % 200 mL Inject 10 mcg/hr into the vein continuous.   glycopyrrolate 0.2 MG/ML injection Commonly known as:  ROBINUL Inject 1.5 mLs (0.3 mg total) into the vein every 4 (four) hours.   LORazepam 2 MG/ML injection Commonly known as:   ATIVAN Inject 0.5-1 mLs (1-2 mg total) into the vein every 2 (two) hours as needed for anxiety.   OXYGEN Inhale 2-3.5 L into the lungs continuous.       Allergies  Allergen Reactions  . Sulfa Antibiotics Other (See Comments)    Unknown allergic reaction    Consultations:  PCCM   Nephroloogy  Cardiology   Procedures/Studies: Ct Abdomen Pelvis Wo Contrast  Result Date: 07/13/2016 CLINICAL DATA:  Anemia. Rule out retroperitoneal hemorrhage. Aneurysm. EXAM: CT ABDOMEN AND PELVIS WITHOUT CONTRAST TECHNIQUE: Multidetector CT imaging of the abdomen and pelvis was performed following the standard protocol without IV contrast. COMPARISON:  CT abdomen pelvis 05/28/2016 FINDINGS: Lower chest: Moderately large bilateral pleural effusions have increased in size since the prior CT. There is compressive atelectasis in both lung bases. Left ventricular enlargement. Calcification of the mitral annulus again noted. Hepatobiliary: Liver gallbladder and bile ducts within normal limits. Pancreas: Negative Spleen: Negative Adrenals/Urinary Tract: Large right upper pole renal cyst measuring 9 x 11 cm. This displaces the kidney inferiorly. No renal hydronephrosis. Atrophic left kidney likely due to renovascular disease. Left renal cyst 4 cm. Sub cm hyperdense nodule left upper pole unchanged. Urinary bladder decompressed with Foley catheter Stomach/Bowel: Stomach and duodenum normal. Negative for bowel obstruction. No bowel mass or edema identified. Sigmoid diverticulosis. Negative for diverticulitis. Vascular/Lymphatic: Atherosclerotic calcifications are present diffusely. Infrarenal abdominal aortic aneurysm measures 3.9 x 4.2 cm. Intimal calcifications remain displaced into the lumen unchanged. No retroperitoneal hemorrhage around the aneurysm. No evidence of aneurysm leak. Atherosclerotic calcification extends throughout the iliac arteries bilaterally. Reproductive: Hysterectomy.  No pelvic mass. Other: Right  inguinal hematoma has improved now measuring 2.4 cm. Extraperitoneal hemorrhage in the right iliac fossa has nearly completely resolved. No new area of hemorrhage. Musculoskeletal: Lumbar disc and facet degeneration. No acute skeletal abnormality right hip replacement. Mild levoscoliosis. IMPRESSION: Near complete resolution of right inguinal hematoma and extraperitoneal hematoma on the right. No new area of hemorrhage. Atherosclerotic infrarenal abdominal aortic aneurysm measures 3.9 x 4.2 cm without evidence of leak. Moderately large bilateral pleural effusions with bibasilar atelectasis. Atrophic left kidney. Bilateral renal cyst. Large right renal cyst. Electronically Signed   By: Marlan Palau M.D.   On: 07/13/2016 11:21   Dg Chest Port 1 View  Result Date: 07/21/2016 CLINICAL DATA:  Respiratory failure, CHF, history of COPD, intubated patient. EXAM: PORTABLE CHEST 1 VIEW COMPARISON:  Portable chest x-ray of July 20, 2016 FINDINGS: The lungs are adequately inflated. Persistent bibasilar alveolar opacities are present with obscuration of the hemidiaphragms. The bilateral pleural effusions greater on the right. Hazy interstitial density in the right upper lobe is stable. The heart is normal in size. The pulmonary vascularity is prominent centrally. There is calcification in the wall of the aortic arch. The endotracheal tube tip lies 3.7 cm above the carina. The esophagogastric tube tip appears to terminate in the mid esophagus which reflects a change since yesterday's study. The right internal jugular Cordis sheath tip projects over the proximal third of the SVC. IMPRESSION: Persistent bibasilar atelectasis or pneumonia with bilateral pleural effusions. Mild interstitial prominence  in the right upper lobe persists. The endotracheal tube is in reasonable position. Aortic atherosclerosis. The esophagogastric tube has been apparently withdrawn such that its tip overlies the mid esophagus. Advancement by  approximately 30 cm is recommended. Electronically Signed   By: David  [place] M.D.   On: 07/21/2016 07:20   Dg Chest Port 1 View  Result Date: 07/20/2016 CLINICAL DATA:  Hemodialysis catheter not flushing well EXAM: PORTABLE CHEST 1 VIEW COMPARISON:  07/19/2016 FINDINGS: Endotracheal tube is 3.7 cm above the carina. Enteric tube extends into the stomach and beyond the inferior edge of the image. Right jugular central line extends into the SVC. No pneumothorax is evident. Effusions are present in both bases. Mild basilar lung opacities persist. Partial clearance of right upper lobe patchy airspace opacity. IMPRESSION: 1. Satisfactorily positioned support equipment 2. No pneumothorax. 3. Persistent pleural effusions. Partial clearance of right upper lobe patchy airspace opacity. Electronically Signed   By: Ellery Plunk M.D.   On: 07/20/2016 05:11   Dg Chest Port 1 View  Result Date: 07/19/2016 CLINICAL DATA:  Status post central line placement. History of atrial fibrillation, COPD, CHF, CVA. EXAM: PORTABLE CHEST 1 VIEW COMPARISON:  Portable chest x-ray of July 19, 2016 at 5:07 a.m. FINDINGS: There has been interval placement of a right internal jugular Cordis sheath. No postprocedure pneumothorax is observed. Interval worsening of interstitial infiltrates throughout the right lung. Slight interval worsening of left upper lobe and left basilar infiltrate as well. The heart is normal in size. The pulmonary vascularity is prominent centrally but there is no definite cephalization. There is calcification in the wall of the aortic arch. The endotracheal tube tip measures 4.7 cm above the carina. The esophagogastric tube tip projects in the proximal gastric body with the proximal port at the level of the GE junction. IMPRESSION: 1. No postprocedure complication following placement of the right internal jugular Cordis sheath. 2. Advancement of the nasogastric tube by 10-20 cm is recommended. The  endotracheal tube appears to be in appropriate position. 3. Worsening of bilateral interstitial and alveolar opacities consistent with progressive pneumonia. Minimal if any CHF. 4. Aortic atherosclerosis. Electronically Signed   By: David  [place] M.D.   On: 07/19/2016 12:11   Dg Chest Port 1 View  Result Date: 07/19/2016 CLINICAL DATA:  Intubated patient.  Respiratory distress. EXAM: PORTABLE CHEST 1 VIEW COMPARISON:  07/18/2016 and multiple prior studies. FINDINGS: The right basilar opacity obscures the hemidiaphragm consistent with a moderate-sized pleural effusion. Opacity at the left lung base is also noted consistent small to moderate pleural effusion. There is vascular congestion with bilateral interstitial thickening. Hazy airspace opacities noted in the right perihilar and left upper lobe. No pneumothorax. Endotracheal tube tip projects 3 cm above the Carina, stable. Nasal/orogastric tube passes below the diaphragm into the stomach, also unchanged. IMPRESSION: 1. Persistent bilateral pleural effusions, vascular congestion and interstitial thickening. Patchy areas of airspace opacity noted, which show a variable distribution over the course of the last several chest radiographs. Findings are most consistent with persistent asymmetric pulmonary edem. Overall, no significant change from the most recent prior study. Electronically Signed   By: Amie Portland M.D.   On: 07/19/2016 07:25   Dg Chest Port 1 View  Result Date: 07/18/2016 CLINICAL DATA:  Acute respiratory failure with hypoxia EXAM: PORTABLE CHEST 1 VIEW COMPARISON:  Portable chest x-ray of July 17, 2016 FINDINGS: The lungs are adequately inflated. The interstitial markings remain increased. Density over the right pulmonary apex is more conspicuous  but is in part due to overlying support apparatus. There are bilateral pleural effusions greater on the right than on the left. These appear stable. The endotracheal tube tip projects  approximately 3.3 cm above the carina. The esophagogastric tube tip projects below the inferior margin of the image. The heart is normal in size. The pulmonary vascularity is indistinct. There is calcification in the wall of the aortic arch. There is moderate dextrocurvature centered in the lower thoracic spine. IMPRESSION: Persistent bilateral pleural effusions and bibasilar atelectasis or infiltrate. Bilateral interstitial edema or infiltrate is slightly less conspicuous. The pulmonary vascularity is less engorged as well. The support tubes are in reasonable position. Electronically Signed   By: David  [place] M.D.   On: 07/18/2016 07:17   Dg Chest Port 1 View  Result Date: 07/17/2016 CLINICAL DATA:  Acute respiratory failure with hypoxemia. EXAM: PORTABLE CHEST 1 VIEW COMPARISON:  07/16/2016 FINDINGS: 0513 hours. Endotracheal tube tip is 2.8 cm above the base the carina. The NG tube passes into the stomach although the distal tip position is not included on the film. Bilateral interstitial and basilar airspace opacity persists. Small bilateral pleural effusions are again noted. Telemetry leads overlie the chest. IMPRESSION: No substantial change in exam. Electronically Signed   By: Kennith Center M.D.   On: 07/17/2016 07:19   Dg Chest Port 1 View  Result Date: 07/16/2016 CLINICAL DATA:  Endotracheal tube placement. EXAM: PORTABLE CHEST 1 VIEW COMPARISON:  Radiographs October 14, 2016. FINDINGS: Stable cardiomediastinal silhouette. Atherosclerosis of thoracic aorta is noted. Endotracheal and nasogastric tubes are in grossly good position. Stable bilateral basilar lung opacities are noted concerning for edema with associated pleural effusions. No pneumothorax is noted. Bony thorax is unremarkable. IMPRESSION: Endotracheal and nasogastric tubes in grossly good position. Stable bilateral basilar edema and pleural effusions are noted. Aortic atherosclerosis. Electronically Signed   By: Lupita Raider, M.D.   On:  07/16/2016 09:31   Dg Chest Port 1 View  Result Date: 07/15/2016 CLINICAL DATA:  Intubation, essential hypertension, COPD, CHF, stage III chronic kidney disease, abdominal aortic aneurysm, atrial fibrillation EXAM: PORTABLE CHEST 1 VIEW COMPARISON:  Portable exam 0537 hours compared to 07/14/2016 FINDINGS: Tip of endotracheal tube within 6 mm of carina, recommend withdrawal 1-2 cm. Enlargement of cardiac silhouette. Atherosclerotic calcification aorta. Diffuse BILATERAL pulmonary infiltrates again identified question edema versus pneumonia. Bibasilar effusions. No pneumothorax. Osseous demineralization. IMPRESSION: Tip of endotracheal tube within 6 mm of carina; recommend withdrawal 1-2 cm. Persistent diffuse BILATERAL airspace infiltrates question edema versus infection with associated bibasilar effusions. Findings called to patient's nurse Dois Davenport RN on [facility] on 07/15/2016 at 0843 hours. Electronically Signed   By: Ulyses Southward M.D.   On: 07/15/2016 08:45   Dg Chest Port 1 View  Result Date: 07/14/2016 CLINICAL DATA:  Intubation . EXAM: PORTABLE CHEST 1 VIEW FINDINGS: Endotracheal tube 1 cm above the lower portion of the carina, withdrawal of approximately 2 cm suggested. NG tube noted with tip below left hemidiaphragm. Heart size normal. Diffuse multifocal bilateral pulmonary infiltrates and/or edema are again noted without interim improvement. Bilateral pleural effusions noted. No pneumothorax. IMPRESSION: 1. Endotracheal tube 1 cm above the lower portion of the carina. Withdrawal of approximately 2 cm suggested. 2. NG tube noted with tip below left hemidiaphragm. 3. Persistent multifocal pulmonary infiltrates are and/or edema again noted. Bilateral pleural effusions again noted. These results will be called to the ordering clinician or representative by the Radiologist Assistant, and communication documented in the PACS or  [facility] Dashboard. Electronically Signed   By: Maisie Fushomas  Register   On: 07/14/2016  12:42   Dg Chest Port 1 View  Result Date: 07/14/2016 CLINICAL DATA:  82 year old female with acute respiratory failure. EXAM: PORTABLE CHEST 1 VIEW COMPARISON:  Chest radiograph dated 07/13/2016 FINDINGS: There has been interval development of a small-to-moderate right pleural effusion, new from prior study. Stable appearing left-sided pleural effusion. There is persistent vascular congestion and interstitial edema. Superimposed pneumonia is not excluded. There is no pneumothorax. Stable mild cardiomegaly. No acute osseous pathology. IMPRESSION: Congestive heart failure with pulmonary edema and interval development of a small to moderate right pleural effusion. Clinical correlation and follow-up recommended. Electronically Signed   By: Elgie CollardArash  Radparvar M.D.   On: 07/14/2016 01:19   Dg Chest Port 1 View  Result Date: 07/13/2016 CLINICAL DATA:  Congestive heart failure EXAM: PORTABLE CHEST 1 VIEW COMPARISON:  July 12, 2016 FINDINGS: There is widespread interstitial edema with left pleural effusion. There is patchy airspace consolidation in the medial bases bilaterally. There is cardiomegaly with pulmonary venous hypertension. There is atherosclerotic calcification in the aorta. There is no new opacity evident. IMPRESSION: Findings indicative of congestive heart failure without appreciable change from 1 day prior. Stable cardiac silhouette. There is aortic atherosclerosis. Electronically Signed   By: Bretta BangWilliam  Woodruff III M.D.   On: 07/13/2016 07:27   Dg Chest Port 1 View  Result Date: 07/12/2016 CLINICAL DATA:  Acute respiratory failure, history of CHF, COPD, chronic renal insufficiency EXAM: PORTABLE CHEST 1 VIEW COMPARISON:  Portable chest x-ray of July 11, 2016 FINDINGS: The lungs are adequately inflated. The left hemidiaphragm remains obscured. There is some obscuration of the right hemidiaphragm now. The pulmonary interstitial markings are increased bilaterally. There are confluent alveolar  opacities in the left upper lobe. The heart is normal in size. There is calcification in the wall of the aortic arch. The trachea is midline. The bony thorax exhibits no acute abnormality. IMPRESSION: Slight interval worsening of pulmonary interstitial edema and bilateral pleural effusions. Persistent pulmonary vascular engorgement. Aortic atherosclerosis. Electronically Signed   By: David  [place] M.D.   On: 07/12/2016 07:12   Dg Chest Port 1 View  Result Date: 07/11/2016 CLINICAL DATA:  Acute on chronic respiratory failure. History of COPD. Worsening shortness of breath. EXAM: PORTABLE CHEST 1 VIEW COMPARISON:  Single-view of the chest 07/10/2016 and 06/14/2016. FINDINGS: Left worse than right airspace disease persists appearing aeration improved improved on the right since the most recent exam. Left pleural effusion is again identified. Heart size is upper normal. Aortic atherosclerosis is seen. IMPRESSION: Extensive bilateral airspace disease shows improvement on the right since the most recent exam. No new abnormality. Left pleural effusion. Electronically Signed   By: Drusilla Kannerhomas  Dalessio M.D.   On: 07/11/2016 19:34   Dg Chest Portable 1 View  Result Date: 07/10/2016 CLINICAL DATA:  Shortness of breath and bilateral lower extremity edema. EXAM: PORTABLE CHEST 1 VIEW COMPARISON:  Single-view of the chest 06/14/2016 and 06/12/2016. FINDINGS: Bilateral airspace disease seen on the most recent examination has worsened. Small to moderate left pleural effusion is unchanged. There is a small right pleural effusion which appears increased. Atherosclerosis is noted. No pneumothorax. IMPRESSION: Worsened bilateral airspace disease compatible with increased pulmonary edema and/or pneumonia. New small right pleural effusion. Small to moderate left pleural effusion is unchanged since the most recent study. Atherosclerosis. Electronically Signed   By: Drusilla Kannerhomas  Dalessio M.D.   On: 07/10/2016 20:09   Dg Abd Portable  1v  Result Date: 07/21/2016 CLINICAL DATA:  OG tube placement EXAM: PORTABLE ABDOMEN - 1 VIEW COMPARISON:  KUB of 07/14/2016, CT of the abdomen pelvis of 07/13/2016 FINDINGS: The OG tube tip overlies the region of the proximal body of the stomach. There is little change in opacity at both lung bases most consistent with bilateral pleural effusions and basilar atelectasis. The bowel gas pattern is nonspecific. Right hip replacement is present. As noted on prior CT, and abdominal aortic aneurysm is noted of 4.2 cm in diameter. IMPRESSION: 1. OG tube tip overlies the region of the midbody of the stomach. 2. Probable bilateral pleural effusions and basilar atelectasis. 3. Distal abdominal aortic aneurysm of approximately 4.2 cm. Electronically Signed   By: Dwyane Dee M.D.   On: 07/21/2016 08:51   Dg Abd Portable 1v  Result Date: 07/14/2016 CLINICAL DATA:  Orogastric tube placement EXAM: PORTABLE ABDOMEN - 1 VIEW COMPARISON:  Portable exam 1221 hours compared to CT abdomen and pelvis 07/13/2013 FINDINGS: Tip of orogastric tube projects over proximal to mid stomach. Visualized bowel gas pattern normal. Aortic atherosclerotic calcification extending and iliac arteries. LEFT lower lobe atelectasis versus consolidation with associated bibasilar effusions. Bones demineralized. IMPRESSION: Tip of orogastric tube projects over proximal to mid stomach. Bibasilar effusions with atelectasis versus consolidation in LEFT lower lobe. Aortic atherosclerosis. Electronically Signed   By: Ulyses Southward M.D.   On: 07/14/2016 12:45     Discharge Exam: Vitals:   07/27/16 0159 07/27/16 0816  BP: 136/73 127/89  Pulse: (!) 129 64  Resp: 10 14  Temp: 99.1 F (37.3 C) 97.5 F (36.4 C)   Vitals:   07/25/16 0533 07/26/16 0332 07/27/16 0159 07/27/16 0816  BP: (!) 154/58  136/73 127/89  Pulse: 74  (!) 129 64  Resp: 16  10 14   Temp: 97.7 F (36.5 C) 99.2 F (37.3 C) 99.1 F (37.3 C) 97.5 F (36.4 C)  TempSrc: Axillary   Axillary Axillary  SpO2: 94%  (!) 84% 91%  Weight:      Height:        General: Lying in bed sleeping comfortable Cardiovascular: RRR, S1/S2 +, no rubs, no gallops Respiratory: Agonal breathing mild rhonchi diffuse Abdominal: Soft, NT, ND, bowel sounds + Extremities: no edema, no cyanosis    The results of significant diagnostics from this hospitalization (including imaging, microbiology, ancillary and laboratory) are listed below for reference.     Microbiology: No results found for this or any previous visit (from the past 240 hour(s)).   Labs: BNP (last 3 results)  Recent Labs  05/23/16 1330 06/12/16 1237 07/10/16 1957  BNP 1,216.0* 2,185.0* 1,649.0*   Basic Metabolic Panel:  Recent Labs Lab 07/20/16 1704 07/21/16 0345  NA 137 136  K 4.0 4.0  CL 100* 102  CO2 27 27  GLUCOSE 111* 152*  BUN 36* 47*  CREATININE 1.04* 1.58*  CALCIUM 8.6* 8.1*  MG  --  2.2  PHOS 2.3* 2.8   Liver Function Tests:  Recent Labs Lab 07/20/16 1704 07/21/16 0345  AST  --  14*  ALT  --  16  ALKPHOS  --  49  BILITOT  --  0.6  PROT  --  4.7*  ALBUMIN 2.8* 2.4*  2.3*   No results for input(s): LIPASE, AMYLASE in the last 168 hours. No results for input(s): AMMONIA in the last 168 hours. CBC:  Recent Labs Lab 07/21/16 0345  WBC 11.0*  HGB 7.2*  HCT 23.1*  MCV 88.8  PLT 128*  Cardiac Enzymes: No results for input(s): CKTOTAL, CKMB, CKMBINDEX, TROPONINI in the last 168 hours. BNP: Invalid input(s): POCBNP CBG:  Recent Labs Lab 07/20/16 2023 07/21/16 0014 07/21/16 0341 07/21/16 0720 07/21/16 1204  GLUCAP 161* 126* 154* 109* 158*   D-Dimer No results for input(s): DDIMER in the last 72 hours. Hgb A1c No results for input(s): HGBA1C in the last 72 hours. Lipid Profile No results for input(s): CHOL, HDL, LDLCALC, TRIG, CHOLHDL, LDLDIRECT in the last 72 hours. Thyroid function studies No results for input(s): TSH, T4TOTAL, T3FREE, THYROIDAB in the last 72  hours.  Invalid input(s): FREET3 Anemia work up No results for input(s): VITAMINB12, FOLATE, FERRITIN, TIBC, IRON, RETICCTPCT in the last 72 hours. Urinalysis    Component Value Date/Time   COLORURINE YELLOW 07/10/2016 2005   APPEARANCEUR CLEAR 07/10/2016 2005   LABSPEC 1.010 07/10/2016 2005   PHURINE 5.5 07/10/2016 2005   GLUCOSEU NEGATIVE 07/10/2016 2005   HGBUR NEGATIVE 07/10/2016 2005   BILIRUBINUR NEGATIVE 07/10/2016 2005   KETONESUR NEGATIVE 07/10/2016 2005   PROTEINUR NEGATIVE 07/10/2016 2005   UROBILINOGEN 0.2 04/22/2010 1413   NITRITE NEGATIVE 07/10/2016 2005   LEUKOCYTESUR NEGATIVE 07/10/2016 2005   Sepsis Labs Invalid input(s): PROCALCITONIN,  WBC,  LACTICIDVEN Microbiology No results found for this or any previous visit (from the past 240 hour(s)).   Time coordinating discharge: Over 30 minutes  SIGNED:  Latrelle Dodrill, MD  [facility] 07/27/2016, 10:56 AM Pager   If 7PM-7AM, please contact night-coverage [email or web address] Password [email or web address]

## 2016-07-27 NOTE — Progress Notes (Signed)
[facility] Waiting for transport arrangements to be made by social worker

## 2016-07-27 NOTE — Progress Notes (Signed)
Fentanyl drip discontinued and 2mg  iv ativan administered as ordered in preparation for transportation. Family consulted prior to medication administration. Still waiting for transport, family at bedside with no concerns voiced

## 2016-08-05 ENCOUNTER — Ambulatory Visit: Payer: Commercial Managed Care - HMO | Admitting: Cardiology

## 2016-08-07 DEATH — deceased

## 2016-08-12 ENCOUNTER — Ambulatory Visit: Payer: Commercial Managed Care - HMO | Admitting: Physician Assistant

## 2016-08-23 ENCOUNTER — Other Ambulatory Visit: Payer: Commercial Managed Care - HMO

## 2016-08-30 ENCOUNTER — Encounter: Payer: Self-pay | Admitting: Vascular Surgery

## 2016-09-01 ENCOUNTER — Ambulatory Visit: Payer: Commercial Managed Care - HMO | Admitting: Vascular Surgery

## 2017-10-18 IMAGING — CT CT ANGIO CHEST
2 of 6 series · 18 of 46 positions shown · IV contrast (ISOVUE)
Comparison: Radiograph earlier this day.

CLINICAL DATA: Shortness of breath for 1 week.

EXAM:
CT ANGIOGRAPHY CHEST WITH CONTRAST
TECHNIQUE: Multidetector CT imaging of the chest was performed using the
standard protocol during bolus administration of intravenous
contrast. Multiplanar CT image reconstructions and MIPs were
obtained to evaluate the vascular anatomy.
CONTRAST:  75 cc Isovue 370 IV

[Series 8: pe thins 1.0 · axial · 0.65mm/px · z∈[-322,-58]mm · 15 of 294 slices shown]
[im 15/294  lung]
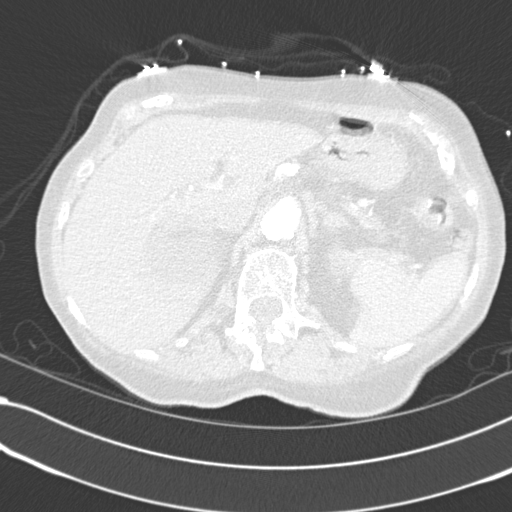
[im 30/294  soft-tissue]
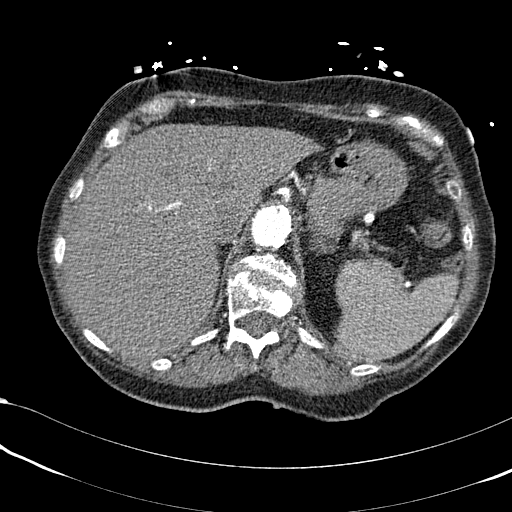
[im 59/294  lung]
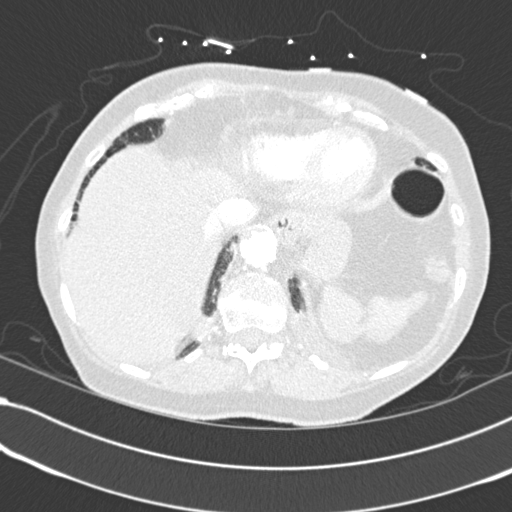
[im 74/294  soft-tissue]
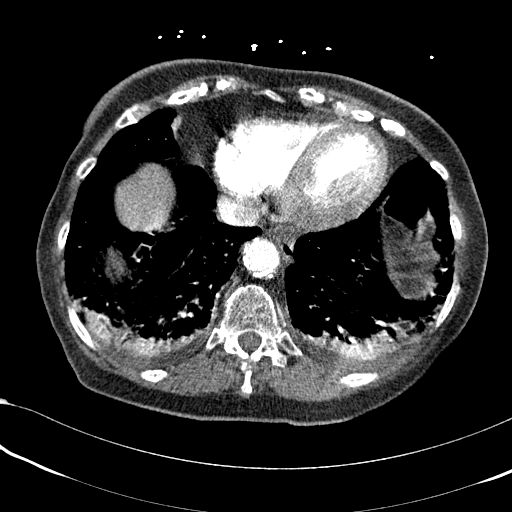
[im 88/294  lung]
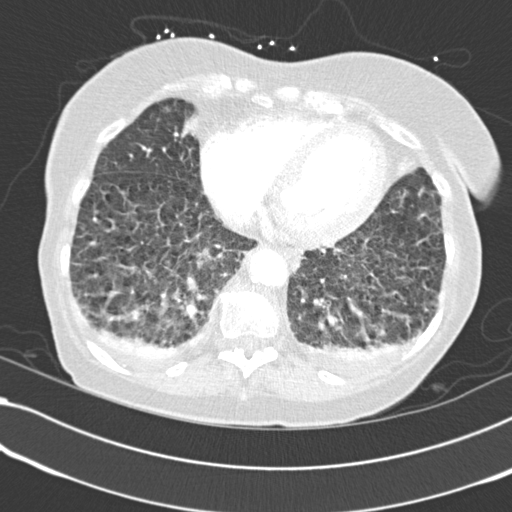
[im 103/294  soft-tissue]
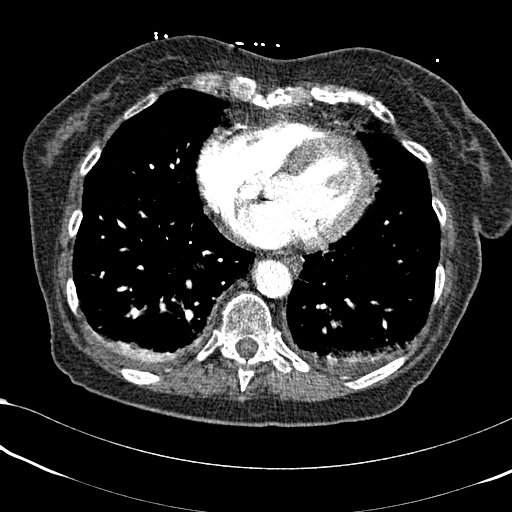
[im 132/294  lung]
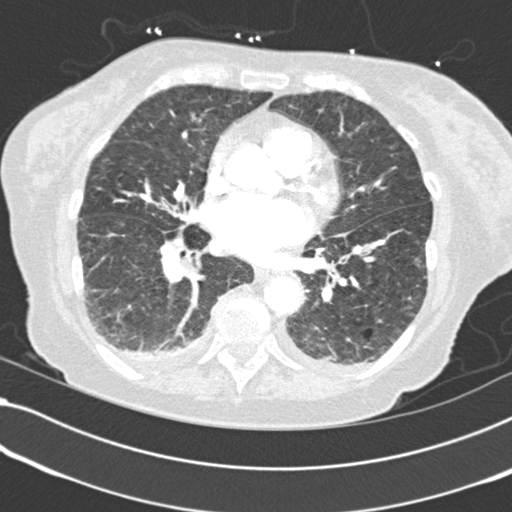
[im 147/294  soft-tissue]
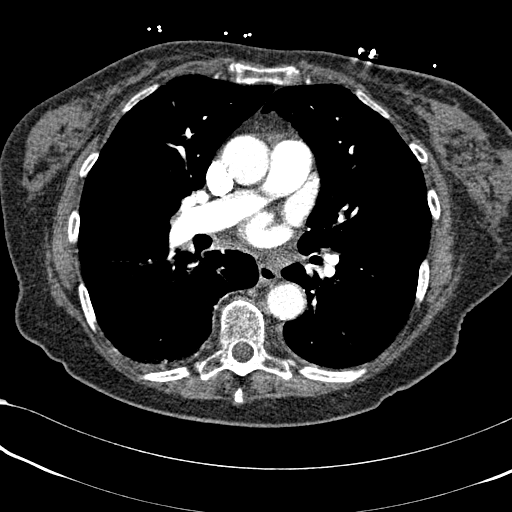
[im 162/294  lung]
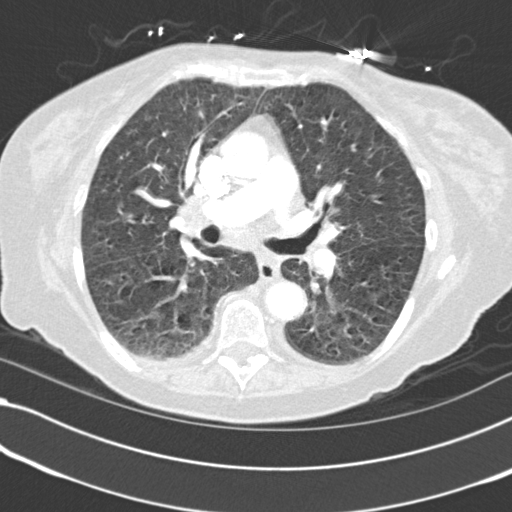
[im 191/294  soft-tissue]
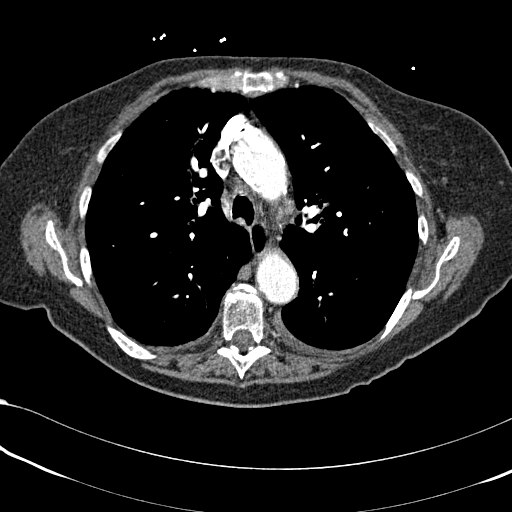
[im 206/294  lung]
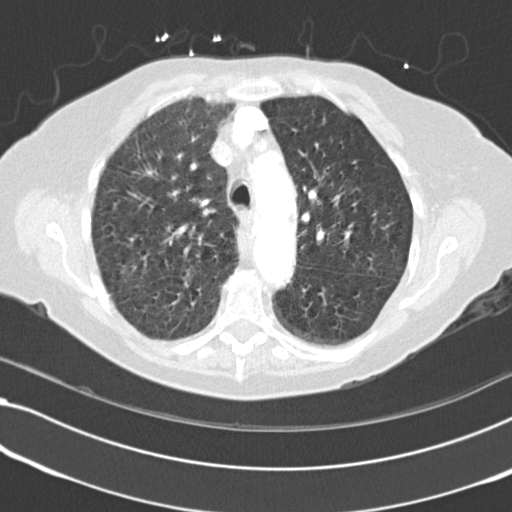
[im 220/294  soft-tissue]
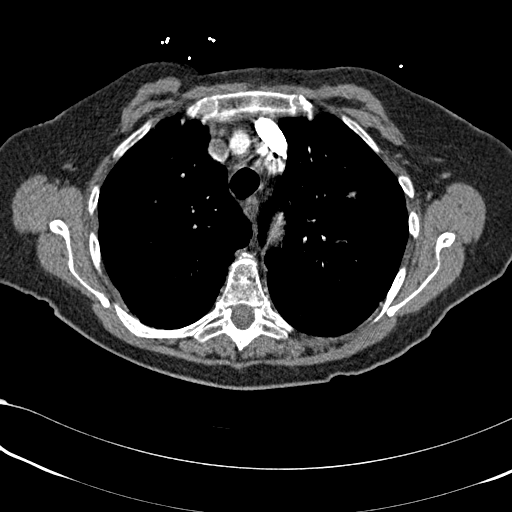
[im 235/294  lung]
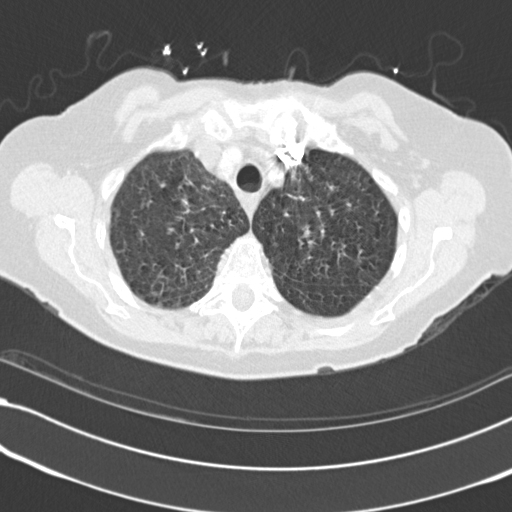
[im 264/294  soft-tissue]
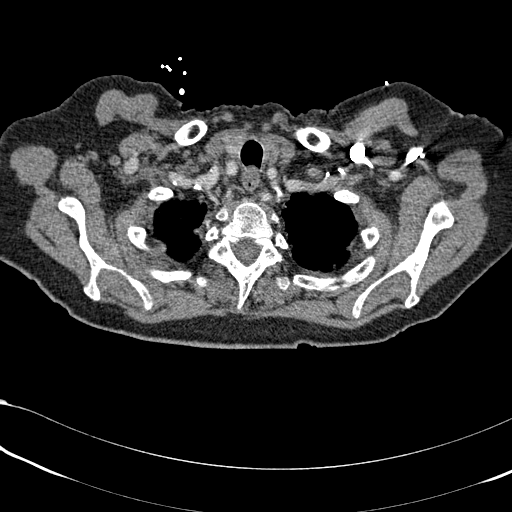
[im 279/294  lung]
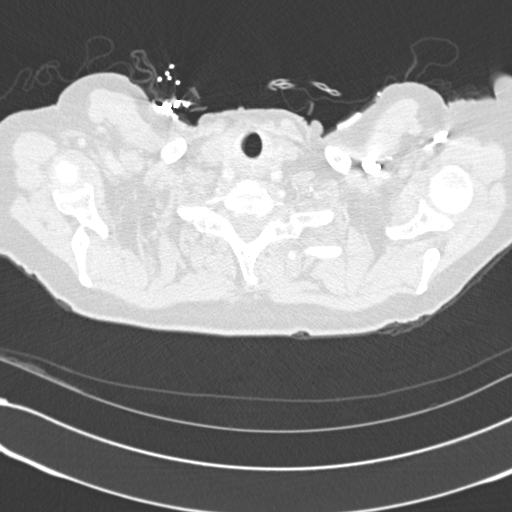

[Series 9: cor mpr 2.0 · coronal · 0.59mm/px · 3 of 121 slices shown]
[im 31/121  soft-tissue]
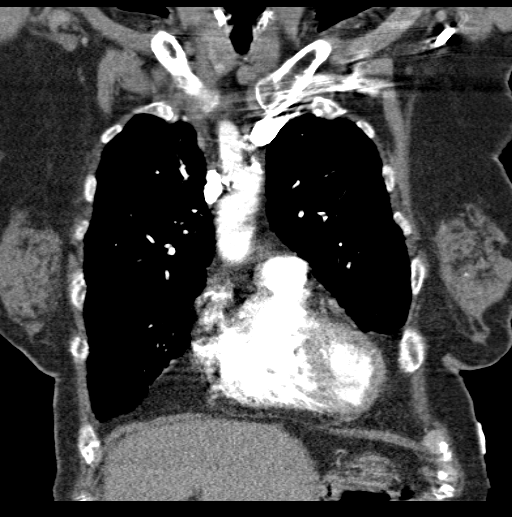
[im 61/121  soft-tissue]
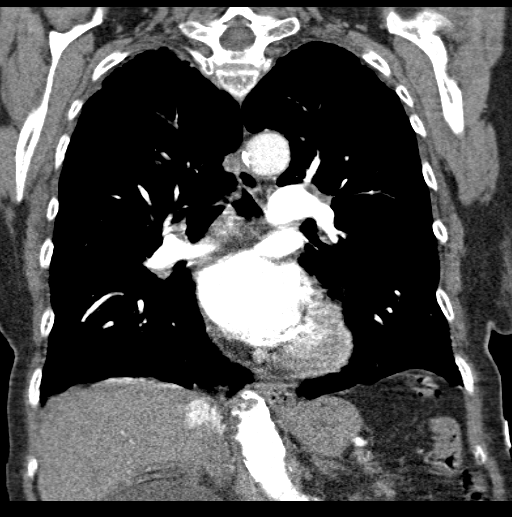
[im 91/121  soft-tissue]
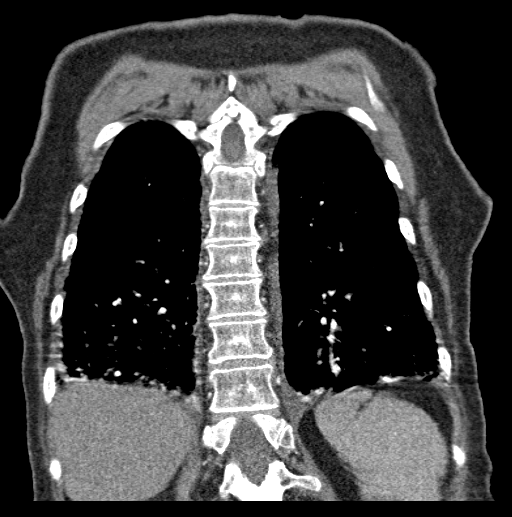

[18 of 46 positions shown; findings below may reference images not displayed]

FINDINGS: Cardiovascular: Motion limits detailed assessment of the pulmonary
arteries, particularly of the lower lobes, no evidence of filling
defects or pulmonary embolus. Calcified and noncalcified
atheromatous plaque throughout the thoracic aorta. No thoracic
aortic aneurysm. Some of this plaque appears irregular. There are
coronary artery calcifications. Minimal reflux of contrast into the
IVC.

Mediastinum/Nodes: There is an enlarged right hilar node measuring
1.8 cm short axis. Soft tissue density in the left infrahilar region
may be a low left hilar node measuring 1.2 cm. Subcarinal node
measures 11 mm. Lower anterior paratracheal node also measures 11
mm. Additional lower paratracheal nodes measuring 10 mm short axis.
No pericardial effusion.

Lungs/Pleura: In the left upper lobe is a 11 x 10 x 10 mm nodule
image 36 series 7. Probable spiculated margins, however are motion
artifact through this region. Punctate subpleural nodule in the
right lower lobe image 58 series 7. Calcified granuloma in the lung
apices. Mild background emphysema. Bronchial thickening particularly
in the lower lobes, detailed dependent lower lobe evaluation limited
by motion. Small pleural effusion with adjacent densities likely
atelectasis. Trachea and proximal bronchi are patent. Probable
atelectasis in the medial right middle lobe. There are small
bilateral pleural effusions with adjacent compressive atelectasis.

Upper Abdomen: Cyst in the right upper quadrant likely renal in
origin, and measures at least 8.3 cm, cyst described in this region
on ultrasound measured 13 cm. Lobular borders of the spleen. Left
adrenal gland tentatively identified without discrete nodule. Right
adrenal gland not definitively seen. Tortuosity and atherosclerosis
of the upper abdominal aorta.

Musculoskeletal: No blastic or destructive lytic lesions.
Exaggerated thoracic kyphosis.

Review of the MIP images confirms the above findings.
IMPRESSION: 1. No pulmonary embolus.
2. Left upper lobe 1 cm pulmonary nodule, partially obscured by
motion, however likely spiculated. Nodule is suspicious for primary
bronchogenic malignancy. This is at the size limit for PET-CT
characterization. There is an enlarged contralateral right hilar
node and prominent mediastinal nodes.
3. Mild emphysema. Bronchial thickening is most prominent in the
lower lobes, may be acute or chronic.
4. Small pleural effusions with likely adjacent atelectasis.
5. Thoracic aortic atherosclerosis including coronary artery
calcifications.
These results will be called to the ordering clinician or
representative by the Radiologist Assistant, and communication
documented in the PACS or zVision Dashboard.
